# Patient Record
Sex: Female | Born: 1965 | Race: Black or African American | Hispanic: No | Smoking: Never smoker
Health system: Southern US, Community
[De-identification: ages and names within clinical notes are randomized; demographics above are authoritative.]

## PROBLEM LIST (undated history)

## (undated) DIAGNOSIS — E785 Hyperlipidemia, unspecified: Secondary | ICD-10-CM

## (undated) HISTORY — PX: KNEE SURGERY: SHX244

## (undated) HISTORY — PX: FEMUR FRACTURE SURGERY: SHX633

---
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
## 2019-03-01 HISTORY — PX: ABDOMINAL HYSTERECTOMY: SHX81

## 2020-04-01 ENCOUNTER — Other Ambulatory Visit: Payer: Self-pay

## 2020-04-01 ENCOUNTER — Ambulatory Visit: Payer: Medicaid Other | Attending: Internal Medicine

## 2020-04-01 VITALS — BP 108/77 | HR 89

## 2020-04-01 DIAGNOSIS — R2681 Unsteadiness on feet: Secondary | ICD-10-CM | POA: Diagnosis present

## 2020-04-01 DIAGNOSIS — M79604 Pain in right leg: Secondary | ICD-10-CM | POA: Diagnosis present

## 2020-04-01 DIAGNOSIS — M6281 Muscle weakness (generalized): Secondary | ICD-10-CM | POA: Diagnosis present

## 2020-04-01 DIAGNOSIS — R2689 Other abnormalities of gait and mobility: Secondary | ICD-10-CM | POA: Diagnosis present

## 2020-04-01 DIAGNOSIS — M79605 Pain in left leg: Secondary | ICD-10-CM | POA: Insufficient documentation
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
## 2020-04-02 NOTE — Addendum Note (Signed)
Addended by: Baird Kay on: 04/02/2020 04:35 PM   Modules accepted: Orders

## 2020-04-07 ENCOUNTER — Ambulatory Visit: Payer: Medicaid Other | Admitting: Physical Therapy

## 2020-04-07 ENCOUNTER — Other Ambulatory Visit: Payer: Self-pay

## 2020-04-07 DIAGNOSIS — M79604 Pain in right leg: Secondary | ICD-10-CM

## 2020-04-07 DIAGNOSIS — M6281 Muscle weakness (generalized): Secondary | ICD-10-CM

## 2020-04-07 DIAGNOSIS — R2689 Other abnormalities of gait and mobility: Secondary | ICD-10-CM

## 2020-04-07 DIAGNOSIS — M79605 Pain in left leg: Secondary | ICD-10-CM

## 2020-04-07 DIAGNOSIS — R2681 Unsteadiness on feet: Secondary | ICD-10-CM
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
## 2020-04-09 ENCOUNTER — Other Ambulatory Visit: Payer: Self-pay

## 2020-04-09 ENCOUNTER — Ambulatory Visit: Payer: Medicaid Other | Admitting: Physical Therapy

## 2020-04-09 DIAGNOSIS — M79604 Pain in right leg: Secondary | ICD-10-CM

## 2020-04-09 DIAGNOSIS — M6281 Muscle weakness (generalized): Secondary | ICD-10-CM

## 2020-04-09 DIAGNOSIS — R2681 Unsteadiness on feet: Secondary | ICD-10-CM

## 2020-04-09 DIAGNOSIS — M79605 Pain in left leg: Secondary | ICD-10-CM

## 2020-04-09 DIAGNOSIS — R2689 Other abnormalities of gait and mobility: Secondary | ICD-10-CM
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
## 2020-04-14 ENCOUNTER — Other Ambulatory Visit: Payer: Self-pay

## 2020-04-14 ENCOUNTER — Ambulatory Visit: Payer: Medicaid Other

## 2020-04-14 DIAGNOSIS — M79605 Pain in left leg: Secondary | ICD-10-CM

## 2020-04-14 DIAGNOSIS — R2689 Other abnormalities of gait and mobility: Secondary | ICD-10-CM

## 2020-04-14 DIAGNOSIS — M6281 Muscle weakness (generalized): Secondary | ICD-10-CM | POA: Diagnosis not present

## 2020-04-14 DIAGNOSIS — M79604 Pain in right leg: Secondary | ICD-10-CM

## 2020-04-14 DIAGNOSIS — R2681 Unsteadiness on feet: Secondary | ICD-10-CM
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
## 2020-04-16 ENCOUNTER — Ambulatory Visit: Payer: Medicaid Other

## 2020-04-16 ENCOUNTER — Other Ambulatory Visit: Payer: Self-pay

## 2020-04-16 DIAGNOSIS — M6281 Muscle weakness (generalized): Secondary | ICD-10-CM | POA: Diagnosis not present

## 2020-04-16 DIAGNOSIS — R2681 Unsteadiness on feet: Secondary | ICD-10-CM

## 2020-04-16 DIAGNOSIS — R2689 Other abnormalities of gait and mobility: Secondary | ICD-10-CM

## 2020-04-16 DIAGNOSIS — M79605 Pain in left leg: Secondary | ICD-10-CM

## 2020-04-16 DIAGNOSIS — M79604 Pain in right leg: Secondary | ICD-10-CM
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
## 2020-04-20 ENCOUNTER — Ambulatory Visit: Payer: Medicaid Other

## 2020-04-20 ENCOUNTER — Other Ambulatory Visit: Payer: Self-pay

## 2020-04-20 DIAGNOSIS — M79605 Pain in left leg: Secondary | ICD-10-CM

## 2020-04-20 DIAGNOSIS — R2689 Other abnormalities of gait and mobility: Secondary | ICD-10-CM

## 2020-04-20 DIAGNOSIS — M6281 Muscle weakness (generalized): Secondary | ICD-10-CM | POA: Diagnosis not present

## 2020-04-20 DIAGNOSIS — M79604 Pain in right leg: Secondary | ICD-10-CM

## 2020-04-20 DIAGNOSIS — R2681 Unsteadiness on feet: Secondary | ICD-10-CM
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
## 2020-04-22 ENCOUNTER — Other Ambulatory Visit: Payer: Self-pay

## 2020-04-22 ENCOUNTER — Ambulatory Visit: Payer: Medicaid Other

## 2020-04-22 DIAGNOSIS — M6281 Muscle weakness (generalized): Secondary | ICD-10-CM

## 2020-04-22 DIAGNOSIS — R2689 Other abnormalities of gait and mobility: Secondary | ICD-10-CM

## 2020-04-22 DIAGNOSIS — M79605 Pain in left leg: Secondary | ICD-10-CM
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
## 2020-04-28 ENCOUNTER — Ambulatory Visit: Payer: Medicaid Other | Attending: Internal Medicine

## 2020-04-28 ENCOUNTER — Other Ambulatory Visit: Payer: Self-pay

## 2020-04-28 VITALS — BP 99/69 | HR 79

## 2020-04-28 DIAGNOSIS — M79605 Pain in left leg: Secondary | ICD-10-CM | POA: Diagnosis present

## 2020-04-28 DIAGNOSIS — R2681 Unsteadiness on feet: Secondary | ICD-10-CM | POA: Insufficient documentation

## 2020-04-28 DIAGNOSIS — M6281 Muscle weakness (generalized): Secondary | ICD-10-CM | POA: Insufficient documentation

## 2020-04-28 DIAGNOSIS — M79604 Pain in right leg: Secondary | ICD-10-CM | POA: Insufficient documentation

## 2020-04-28 DIAGNOSIS — R2689 Other abnormalities of gait and mobility: Secondary | ICD-10-CM | POA: Diagnosis present
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
## 2020-04-30 ENCOUNTER — Other Ambulatory Visit: Payer: Self-pay

## 2020-04-30 ENCOUNTER — Ambulatory Visit: Payer: Medicaid Other

## 2020-04-30 DIAGNOSIS — R2689 Other abnormalities of gait and mobility: Secondary | ICD-10-CM

## 2020-04-30 DIAGNOSIS — M6281 Muscle weakness (generalized): Secondary | ICD-10-CM | POA: Diagnosis not present

## 2020-04-30 DIAGNOSIS — M79605 Pain in left leg: Secondary | ICD-10-CM
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
## 2020-05-05 ENCOUNTER — Ambulatory Visit: Payer: Medicaid Other

## 2020-05-05 ENCOUNTER — Other Ambulatory Visit: Payer: Self-pay

## 2020-05-05 DIAGNOSIS — R2689 Other abnormalities of gait and mobility: Secondary | ICD-10-CM

## 2020-05-05 DIAGNOSIS — M6281 Muscle weakness (generalized): Secondary | ICD-10-CM | POA: Diagnosis not present

## 2020-05-05 DIAGNOSIS — M79605 Pain in left leg: Secondary | ICD-10-CM
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
## 2020-05-07 ENCOUNTER — Ambulatory Visit: Payer: Medicaid Other

## 2020-05-07 ENCOUNTER — Other Ambulatory Visit: Payer: Self-pay

## 2020-05-07 DIAGNOSIS — R2681 Unsteadiness on feet: Secondary | ICD-10-CM

## 2020-05-07 DIAGNOSIS — M79605 Pain in left leg: Secondary | ICD-10-CM

## 2020-05-07 DIAGNOSIS — M6281 Muscle weakness (generalized): Secondary | ICD-10-CM

## 2020-05-07 DIAGNOSIS — M79604 Pain in right leg: Secondary | ICD-10-CM

## 2020-05-07 DIAGNOSIS — R2689 Other abnormalities of gait and mobility: Secondary | ICD-10-CM
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
## 2020-05-12 ENCOUNTER — Ambulatory Visit: Payer: Medicaid Other

## 2020-05-12 ENCOUNTER — Other Ambulatory Visit: Payer: Self-pay

## 2020-05-12 DIAGNOSIS — M79605 Pain in left leg: Secondary | ICD-10-CM

## 2020-05-12 DIAGNOSIS — M79604 Pain in right leg: Secondary | ICD-10-CM

## 2020-05-12 DIAGNOSIS — M6281 Muscle weakness (generalized): Secondary | ICD-10-CM | POA: Diagnosis not present

## 2020-05-12 DIAGNOSIS — R2689 Other abnormalities of gait and mobility: Secondary | ICD-10-CM
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
## 2020-05-14 ENCOUNTER — Other Ambulatory Visit: Payer: Self-pay

## 2020-05-14 ENCOUNTER — Ambulatory Visit: Payer: Medicaid Other

## 2020-05-14 DIAGNOSIS — M79605 Pain in left leg: Secondary | ICD-10-CM

## 2020-05-14 DIAGNOSIS — M6281 Muscle weakness (generalized): Secondary | ICD-10-CM | POA: Diagnosis not present

## 2020-05-14 DIAGNOSIS — R2689 Other abnormalities of gait and mobility: Secondary | ICD-10-CM

## 2020-05-14 DIAGNOSIS — M79604 Pain in right leg: Secondary | ICD-10-CM
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
## 2020-05-19 ENCOUNTER — Ambulatory Visit: Payer: Medicaid Other

## 2020-05-21 ENCOUNTER — Ambulatory Visit: Payer: Medicaid Other

## 2020-05-26 ENCOUNTER — Other Ambulatory Visit: Payer: Self-pay

## 2020-05-26 ENCOUNTER — Ambulatory Visit: Payer: Medicaid Other

## 2020-05-26 VITALS — BP 113/72 | HR 68

## 2020-05-26 DIAGNOSIS — M6281 Muscle weakness (generalized): Secondary | ICD-10-CM

## 2020-05-26 DIAGNOSIS — M79605 Pain in left leg: Secondary | ICD-10-CM
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
## 2020-05-28 ENCOUNTER — Ambulatory Visit: Payer: Medicaid Other

## 2020-06-01 ENCOUNTER — Ambulatory Visit: Payer: Medicaid Other

## 2020-06-03 ENCOUNTER — Ambulatory Visit: Payer: Medicaid Other

## 2020-06-08 ENCOUNTER — Ambulatory Visit: Payer: Medicaid Other

## 2020-06-10 ENCOUNTER — Ambulatory Visit: Payer: Medicaid Other

## 2020-06-17 ENCOUNTER — Ambulatory Visit: Payer: Medicaid Other | Attending: Internal Medicine

## 2020-06-17 ENCOUNTER — Other Ambulatory Visit (HOSPITAL_COMMUNITY): Payer: Self-pay | Admitting: Family Medicine

## 2020-06-17 ENCOUNTER — Other Ambulatory Visit: Payer: Self-pay

## 2020-06-17 ENCOUNTER — Other Ambulatory Visit: Payer: Self-pay | Admitting: Family Medicine

## 2020-06-17 DIAGNOSIS — R2689 Other abnormalities of gait and mobility: Secondary | ICD-10-CM | POA: Diagnosis not present

## 2020-06-17 DIAGNOSIS — M79604 Pain in right leg: Secondary | ICD-10-CM | POA: Diagnosis present

## 2020-06-17 DIAGNOSIS — M79652 Pain in left thigh: Secondary | ICD-10-CM

## 2020-06-17 DIAGNOSIS — R2681 Unsteadiness on feet: Secondary | ICD-10-CM

## 2020-06-17 DIAGNOSIS — R1032 Left lower quadrant pain: Secondary | ICD-10-CM

## 2020-06-17 DIAGNOSIS — M79605 Pain in left leg: Secondary | ICD-10-CM | POA: Diagnosis present

## 2020-06-17 DIAGNOSIS — M6281 Muscle weakness (generalized): Secondary | ICD-10-CM
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
## 2020-06-22 ENCOUNTER — Ambulatory Visit: Payer: Medicaid Other

## 2020-06-24 ENCOUNTER — Other Ambulatory Visit: Payer: Self-pay

## 2020-06-24 ENCOUNTER — Ambulatory Visit: Payer: Medicaid Other

## 2020-06-24 DIAGNOSIS — M79605 Pain in left leg: Secondary | ICD-10-CM

## 2020-06-24 DIAGNOSIS — R2681 Unsteadiness on feet: Secondary | ICD-10-CM

## 2020-06-24 DIAGNOSIS — R2689 Other abnormalities of gait and mobility: Secondary | ICD-10-CM

## 2020-06-24 DIAGNOSIS — M6281 Muscle weakness (generalized): Secondary | ICD-10-CM

## 2020-06-24 DIAGNOSIS — M79604 Pain in right leg: Secondary | ICD-10-CM
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
## 2020-06-27 ENCOUNTER — Ambulatory Visit: Payer: Medicaid Other

## 2020-07-01 ENCOUNTER — Ambulatory Visit: Payer: Medicaid Other

## 2020-07-03 ENCOUNTER — Encounter: Payer: Self-pay | Admitting: Emergency Medicine

## 2020-07-03 ENCOUNTER — Emergency Department
Admission: EM | Admit: 2020-07-03 | Discharge: 2020-07-03 | Disposition: A | Discharge status: Home / Self Care | Payer: Medicaid Other | Attending: Emergency Medicine | Admitting: Emergency Medicine

## 2020-07-03 ENCOUNTER — Emergency Department: Payer: Medicaid Other

## 2020-07-03 DIAGNOSIS — M79662 Pain in left lower leg: Secondary | ICD-10-CM | POA: Diagnosis present

## 2020-07-03 DIAGNOSIS — M79605 Pain in left leg: Secondary | ICD-10-CM

## 2020-07-03 DIAGNOSIS — M7989 Other specified soft tissue disorders: Secondary | ICD-10-CM

## 2020-07-03 LAB — URINALYSIS, COMPLETE (UACMP) WITH MICROSCOPIC
Bilirubin Urine: NEGATIVE
Glucose, UA: NEGATIVE mg/dL
Hgb urine dipstick: NEGATIVE
Ketones, ur: NEGATIVE mg/dL
Leukocytes,Ua: NEGATIVE
Nitrite: NEGATIVE
Protein, ur: NEGATIVE mg/dL
Specific Gravity, Urine: 1.019 (ref 1.005–1.030)
pH: 7 (ref 5.0–8.0)

## 2020-07-03 LAB — CBC WITH DIFFERENTIAL/PLATELET
Abs Immature Granulocytes: 0.02 10*3/uL (ref 0.00–0.07)
Basophils Absolute: 0 10*3/uL (ref 0.0–0.1)
Basophils Relative: 1 %
Eosinophils Absolute: 0.1 10*3/uL (ref 0.0–0.5)
Eosinophils Relative: 2 %
HCT: 38.1 % (ref 36.0–46.0)
Hemoglobin: 12.2 g/dL (ref 12.0–15.0)
Immature Granulocytes: 0 %
Lymphocytes Relative: 33 %
Lymphs Abs: 1.9 10*3/uL (ref 0.7–4.0)
MCH: 27.1 pg (ref 26.0–34.0)
MCHC: 32 g/dL (ref 30.0–36.0)
MCV: 84.7 fL (ref 80.0–100.0)
Monocytes Absolute: 0.5 10*3/uL (ref 0.1–1.0)
Monocytes Relative: 8 %
Neutro Abs: 3.3 10*3/uL (ref 1.7–7.7)
Neutrophils Relative %: 56 %
Platelets: 210 10*3/uL (ref 150–400)
RBC: 4.5 MIL/uL (ref 3.87–5.11)
RDW: 14.6 % (ref 11.5–15.5)
WBC: 5.9 10*3/uL (ref 4.0–10.5)
nRBC: 0 % (ref 0.0–0.2)

## 2020-07-03 LAB — COMPREHENSIVE METABOLIC PANEL
ALT: 12 U/L (ref 0–44)
AST: 14 U/L — ABNORMAL LOW (ref 15–41)
Albumin: 4 g/dL (ref 3.5–5.0)
Alkaline Phosphatase: 57 U/L (ref 38–126)
Anion gap: 9 (ref 5–15)
BUN: 19 mg/dL (ref 6–20)
CO2: 23 mmol/L (ref 22–32)
Calcium: 9.2 mg/dL (ref 8.9–10.3)
Chloride: 106 mmol/L (ref 98–111)
Creatinine, Ser: 0.82 mg/dL (ref 0.44–1.00)
GFR, Estimated: >60 mL/min (ref >60)
Glucose, Bld: 112 mg/dL — ABNORMAL HIGH (ref 70–99)
Potassium: 3.7 mmol/L (ref 3.5–5.1)
Sodium: 138 mmol/L (ref 135–145)
Total Bilirubin: 0.6 mg/dL (ref 0.3–1.2)
Total Protein: 7.4 g/dL (ref 6.5–8.1)

## 2020-07-03 NOTE — ED Triage Notes (Signed)
C/O left leg swelling x 2 weeks.  Here to r/o DVT

## 2020-07-03 NOTE — Discharge Instructions (Addendum)
US DVT negative Follow up for your doctor for MRI to further evaluate.  Return to ER for any other concern
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
## 2020-07-07 ENCOUNTER — Ambulatory Visit: Payer: Medicaid Other

## 2020-07-10 ENCOUNTER — Other Ambulatory Visit: Payer: Self-pay

## 2020-07-10 ENCOUNTER — Ambulatory Visit: Payer: Medicaid Other | Attending: Internal Medicine

## 2020-07-10 DIAGNOSIS — M6281 Muscle weakness (generalized): Secondary | ICD-10-CM | POA: Diagnosis present

## 2020-07-10 DIAGNOSIS — R2689 Other abnormalities of gait and mobility: Secondary | ICD-10-CM | POA: Diagnosis present

## 2020-07-10 DIAGNOSIS — M79605 Pain in left leg: Secondary | ICD-10-CM | POA: Insufficient documentation
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
## 2020-07-15 ENCOUNTER — Ambulatory Visit: Payer: Medicaid Other

## 2020-07-20 ENCOUNTER — Ambulatory Visit: Payer: Medicaid Other

## 2020-07-24 ENCOUNTER — Ambulatory Visit: Payer: Medicaid Other

## 2020-07-29 ENCOUNTER — Ambulatory Visit: Payer: Medicaid Other

## 2020-07-31 ENCOUNTER — Other Ambulatory Visit: Payer: Self-pay

## 2020-07-31 ENCOUNTER — Ambulatory Visit
Admission: RE | Admit: 2020-07-31 | Discharge: 2020-07-31 | Disposition: A | Discharge status: Home / Self Care | Payer: Medicaid Other | Source: Ambulatory Visit | Attending: Family Medicine | Admitting: Family Medicine

## 2020-07-31 DIAGNOSIS — R1032 Left lower quadrant pain: Secondary | ICD-10-CM | POA: Diagnosis present

## 2020-07-31 DIAGNOSIS — M79652 Pain in left thigh: Secondary | ICD-10-CM | POA: Diagnosis not present

## 2020-08-05 ENCOUNTER — Other Ambulatory Visit: Payer: Self-pay

## 2020-08-05 ENCOUNTER — Ambulatory Visit: Payer: Medicaid Other | Attending: Internal Medicine

## 2020-08-05 DIAGNOSIS — M6281 Muscle weakness (generalized): Secondary | ICD-10-CM | POA: Diagnosis present

## 2020-08-05 DIAGNOSIS — M79604 Pain in right leg: Secondary | ICD-10-CM | POA: Insufficient documentation

## 2020-08-05 DIAGNOSIS — R2681 Unsteadiness on feet: Secondary | ICD-10-CM | POA: Diagnosis present

## 2020-08-05 DIAGNOSIS — R2689 Other abnormalities of gait and mobility: Secondary | ICD-10-CM | POA: Insufficient documentation

## 2020-08-05 DIAGNOSIS — M79605 Pain in left leg: Secondary | ICD-10-CM | POA: Diagnosis present
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
## 2020-08-12 ENCOUNTER — Other Ambulatory Visit: Payer: Self-pay

## 2020-08-12 ENCOUNTER — Ambulatory Visit: Payer: Medicaid Other

## 2020-08-12 DIAGNOSIS — M79605 Pain in left leg: Secondary | ICD-10-CM | POA: Diagnosis not present

## 2020-08-12 DIAGNOSIS — R2689 Other abnormalities of gait and mobility: Secondary | ICD-10-CM

## 2020-08-12 DIAGNOSIS — M6281 Muscle weakness (generalized): Secondary | ICD-10-CM
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
## 2020-08-19 ENCOUNTER — Ambulatory Visit: Payer: Medicaid Other

## 2020-08-26 ENCOUNTER — Ambulatory Visit: Payer: Medicaid Other

## 2020-09-02 ENCOUNTER — Other Ambulatory Visit: Payer: Self-pay

## 2020-09-02 ENCOUNTER — Emergency Department: Payer: Medicaid Other

## 2020-09-02 ENCOUNTER — Encounter: Payer: Self-pay | Admitting: Emergency Medicine

## 2020-09-02 ENCOUNTER — Emergency Department
Admission: EM | Admit: 2020-09-02 | Discharge: 2020-09-02 | Disposition: A | Discharge status: Home / Self Care | Payer: Medicaid Other | Attending: Emergency Medicine | Admitting: Emergency Medicine

## 2020-09-02 DIAGNOSIS — W228XXA Striking against or struck by other objects, initial encounter: Secondary | ICD-10-CM | POA: Diagnosis not present

## 2020-09-02 DIAGNOSIS — M25552 Pain in left hip: Secondary | ICD-10-CM | POA: Diagnosis present

## 2020-09-02 DIAGNOSIS — M79641 Pain in right hand: Secondary | ICD-10-CM | POA: Insufficient documentation

## 2020-09-02 DIAGNOSIS — M25559 Pain in unspecified hip: Secondary | ICD-10-CM

## 2020-09-02 NOTE — Discharge Instructions (Signed)
Your exam and x-rays are normal at this time.  No signs of any serious injury or dislocation of the hip.  Continue with your home muscle relaxant, follow-up with primary orthopedic provider for ongoing symptoms.  Return to the ED if necessary.
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
## 2021-01-14 ENCOUNTER — Ambulatory Visit: Payer: Self-pay | Admitting: Student

## 2021-01-14 DIAGNOSIS — S72352K Displaced comminuted fracture of shaft of left femur, subsequent encounter for closed fracture with nonunion: Secondary | ICD-10-CM | POA: Insufficient documentation

## 2021-01-29 NOTE — Final Progress Note (Signed)
Surgical Instructions    Your procedure is scheduled on 02/03/21.  Report to [facility] Main Entrance "A" at 6:30 A.M., then check in with the Admitting office.  Call this number if you have problems the morning of surgery:  [phone]   If you have any questions prior to your surgery date call [phone]: Open Monday-Friday 8am-4pm    Remember:  Do not eat after midnight the night before your surgery  You may drink clear liquids until 5:30 the morning of your surgery.   Clear liquids allowed are: Water, Non-Citrus Juices (without pulp), Carbonated Beverages, Clear Tea, Black Coffee ONLY (NO MILK, CREAM OR POWDERED CREAMER of any kind), and Gatorade  Please complete your PRE-SURGERY ENSURE that was provided to you by 5:30 the morning of surgery.  Please, if able, drink it in one setting. DO NOT SIP.     Take these medicines the morning of surgery with A SIP OF WATER: fluticasone (FLONASE)   As of today, STOP taking any Aspirin (unless otherwise instructed by your surgeon) Aleve, Naproxen, Ibuprofen, Motrin, Advil, Goody's, BC's, all herbal medications, fish oil, and all vitamins.     After your COVID test   You are not required to quarantine however you are required to wear a well-fitting mask when you are out and around people not in your household.  If your mask becomes wet or soiled, replace with a new one.  Wash your hands often with soap and water for 20 seconds or clean your hands with an alcohol-based hand sanitizer that contains at least 60% alcohol.  Do not share personal items.  Notify your provider: if you are in close contact with someone who has COVID  or if you develop a fever of 100.4 or greater, sneezing, cough, sore throat, shortness of breath or body aches.           Do not wear jewelry or makeup Do not wear lotions, powders, perfumes  or deodorant. Do not shave 48 hours prior to surgery.   Do not bring valuables to the hospital. DO Not wear nail  polish, gel polish, artificial nails, or any other type of covering on natural nails including finger and toenails. If patients have artificial nails, gel coating, etc. that need to be removed by a nail salon, please have this removed prior to surgery or surgery may need to be canceled/delayed if the surgeon/ anesthesia feels like the patient is unable to be adequately monitored.             [facility] is not responsible for any belongings or valuables.  Do NOT Smoke (Tobacco/Vaping)  24 hours prior to your procedure  If you use a CPAP at night, you may bring your mask for your overnight stay.   Contacts, glasses, hearing aids, dentures or partials may not be worn into surgery, please bring cases for these belongings   For patients admitted to the hospital, discharge time will be determined by your treatment team.   Patients discharged the day of surgery will not be allowed to drive home, and someone needs to stay with them for 24 hours.  NO VISITORS WILL BE ALLOWED IN PRE-OP WHERE PATIENTS ARE PREPPED FOR SURGERY.  ONLY 1 SUPPORT PERSON MAY BE PRESENT IN THE WAITING ROOM WHILE YOU ARE IN SURGERY.  IF YOU ARE TO BE ADMITTED, ONCE YOU ARE IN YOUR ROOM YOU WILL BE ALLOWED TWO (2) VISITORS. 1 (ONE) VISITOR MAY STAY OVERNIGHT BUT MUST ARRIVE TO THE ROOM BY  8pm.  Minor children may have two parents present. Special consideration for safety and communication needs will be reviewed on a case by case basis.  Special instructions:    Oral Hygiene is also important to reduce your risk of infection.  Remember - BRUSH YOUR TEETH THE MORNING OF SURGERY WITH YOUR REGULAR TOOTHPASTE   [place]- Preparing For Surgery  Before surgery, you can play an important role. Because skin is not sterile, your skin needs to be as free of germs as possible. You can reduce the number of germs on your skin by washing with CHG (chlorahexidine gluconate) Soap before surgery.  CHG is an antiseptic cleaner which kills  germs and bonds with the skin to continue killing germs even after washing.     Please do not use if you have an allergy to CHG or antibacterial soaps. If your skin becomes reddened/irritated stop using the CHG.  Do not shave (including legs and underarms) for at least 48 hours prior to first CHG shower. It is OK to shave your face.  Please follow these instructions carefully.     Shower the NIGHT BEFORE SURGERY and the MORNING OF SURGERY with CHG Soap.   If you chose to wash your hair, wash your hair first as usual with your normal shampoo. After you shampoo, rinse your hair and body thoroughly to remove the shampoo.  Then [employer] and genitals (private parts) with your normal soap and rinse thoroughly to remove soap.  After that Use CHG Soap as you would any other liquid soap. You can apply CHG directly to the skin and wash gently with a scrungie or a clean washcloth.   Apply the CHG Soap to your body ONLY FROM THE NECK DOWN.  Do not use on open wounds or open sores. Avoid contact with your eyes, ears, mouth and genitals (private parts). Wash Face and genitals (private parts)  with your normal soap.   Wash thoroughly, paying special attention to the area where your surgery will be performed.  Thoroughly rinse your body with warm water from the neck down.  DO NOT shower/wash with your normal soap after using and rinsing off the CHG Soap.  Pat yourself dry with a CLEAN TOWEL.  Wear CLEAN PAJAMAS to bed the night before surgery  Place CLEAN SHEETS on your bed the night before your surgery  DO NOT SLEEP WITH PETS.   Day of Surgery:  Take a shower with CHG soap. Wear Clean/Comfortable clothing the morning of surgery Do not apply any deodorants/lotions.   Remember to brush your teeth WITH YOUR REGULAR TOOTHPASTE.   Please read over the following fact sheets that you were given.

## 2021-02-01 ENCOUNTER — Encounter (HOSPITAL_COMMUNITY): Payer: Self-pay

## 2021-02-01 ENCOUNTER — Encounter (HOSPITAL_COMMUNITY)
Admission: RE | Admit: 2021-02-01 | Discharge: 2021-02-01 | Disposition: A | Discharge status: Home / Self Care | Payer: Medicaid Other | Source: Ambulatory Visit | Attending: Student | Admitting: Student

## 2021-02-01 ENCOUNTER — Other Ambulatory Visit: Payer: Self-pay

## 2021-02-01 VITALS — BP 128/87 | HR 80 | Temp 98.5°F | Resp 17 | Ht 68.0 in | Wt 240.5 lb

## 2021-02-01 DIAGNOSIS — S72352K Displaced comminuted fracture of shaft of left femur, subsequent encounter for closed fracture with nonunion: Secondary | ICD-10-CM | POA: Insufficient documentation

## 2021-02-01 DIAGNOSIS — Z20822 Contact with and (suspected) exposure to covid-19: Secondary | ICD-10-CM | POA: Insufficient documentation

## 2021-02-01 DIAGNOSIS — Z01812 Encounter for preprocedural laboratory examination: Secondary | ICD-10-CM | POA: Insufficient documentation

## 2021-02-01 DIAGNOSIS — X58XXXD Exposure to other specified factors, subsequent encounter: Secondary | ICD-10-CM | POA: Diagnosis not present

## 2021-02-01 DIAGNOSIS — Z01818 Encounter for other preprocedural examination: Secondary | ICD-10-CM

## 2021-02-01 HISTORY — DX: Hyperlipidemia, unspecified: E78.5

## 2021-02-01 LAB — CBC WITH DIFFERENTIAL/PLATELET
Abs Immature Granulocytes: 0.01 10*3/uL (ref 0.00–0.07)
Basophils Absolute: 0 10*3/uL (ref 0.0–0.1)
Basophils Relative: 1 %
Eosinophils Absolute: 0.1 10*3/uL (ref 0.0–0.5)
Eosinophils Relative: 2 %
HCT: 42.9 % (ref 36.0–46.0)
Hemoglobin: 13.5 g/dL (ref 12.0–15.0)
Immature Granulocytes: 0 %
Lymphocytes Relative: 41 %
Lymphs Abs: 2.2 10*3/uL (ref 0.7–4.0)
MCH: 27.6 pg (ref 26.0–34.0)
MCHC: 31.5 g/dL (ref 30.0–36.0)
MCV: 87.6 fL (ref 80.0–100.0)
Monocytes Absolute: 0.5 10*3/uL (ref 0.1–1.0)
Monocytes Relative: 9 %
Neutro Abs: 2.6 10*3/uL (ref 1.7–7.7)
Neutrophils Relative %: 47 %
Platelets: 261 10*3/uL (ref 150–400)
RBC: 4.9 MIL/uL (ref 3.87–5.11)
RDW: 14.5 % (ref 11.5–15.5)
WBC: 5.5 10*3/uL (ref 4.0–10.5)
nRBC: 0 % (ref 0.0–0.2)

## 2021-02-01 LAB — HEMOGLOBIN A1C
Hgb A1c MFr Bld: 6 % — ABNORMAL HIGH (ref 4.8–5.6)
Mean Plasma Glucose: 125.5 mg/dL

## 2021-02-01 LAB — BASIC METABOLIC PANEL
Anion gap: 7 (ref 5–15)
BUN: 18 mg/dL (ref 6–20)
CO2: 26 mmol/L (ref 22–32)
Calcium: 9.4 mg/dL (ref 8.9–10.3)
Chloride: 102 mmol/L (ref 98–111)
Creatinine, Ser: 0.95 mg/dL (ref 0.44–1.00)
GFR, Estimated: >60 mL/min (ref >60)
Glucose, Bld: 83 mg/dL (ref 70–99)
Potassium: 4 mmol/L (ref 3.5–5.1)
Sodium: 135 mmol/L (ref 135–145)

## 2021-02-01 LAB — TSH: TSH: 1.158 u[IU]/mL (ref 0.350–4.500)

## 2021-02-01 LAB — SEDIMENTATION RATE: Sed Rate: 24 mm/hr — ABNORMAL HIGH (ref 0–22)

## 2021-02-01 LAB — PROTIME-INR
INR: 1 (ref 0.8–1.2)
Prothrombin Time: 13.3 seconds (ref 11.4–15.2)

## 2021-02-01 LAB — C-REACTIVE PROTEIN: CRP: 0.8 mg/dL (ref <1.0)

## 2021-02-01 LAB — VITAMIN D 25 HYDROXY (VIT D DEFICIENCY, FRACTURES): Vit D, 25-Hydroxy: 22.07 ng/mL — ABNORMAL LOW (ref 30–100)

## 2021-02-01 LAB — SARS CORONAVIRUS 2 (TAT 6-24 HRS): SARS Coronavirus 2: NEGATIVE

## 2021-02-01 NOTE — Progress Notes (Signed)
PCP - [employer] [place]  Chest x-ray - 11/26/19 CE EKG - 11/30/19  DM - Diabetes  ERAS Protcol - Yes  PRE-SURGERY Ensure Drink given   COVID TEST- 02/01/21   Anesthesia review: No  Patient denies shortness of breath, fever, cough and chest pain at PAT appointment   All instructions explained to the patient, with a verbal understanding of the material. Patient agrees to go over the instructions while at home for a better understanding. Patient also instructed to wear a mask while in public after being tested for COVID-19. The opportunity to ask questions was provided.
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
## 2021-02-03 ENCOUNTER — Inpatient Hospital Stay (HOSPITAL_COMMUNITY)
Admission: RE | Admit: 2021-02-03 | Discharge: 2021-02-04 | DRG: 481 | Disposition: A | Discharge status: Home / Self Care | Payer: Medicaid Other | Source: Ambulatory Visit | Attending: Student | Admitting: Student

## 2021-02-03 ENCOUNTER — Inpatient Hospital Stay (HOSPITAL_COMMUNITY): Payer: Medicaid Other

## 2021-02-03 ENCOUNTER — Ambulatory Visit (HOSPITAL_COMMUNITY): Payer: Medicaid Other | Admitting: Certified Registered"

## 2021-02-03 ENCOUNTER — Encounter (HOSPITAL_COMMUNITY)
Admission: RE | Disposition: A | Discharge status: Home / Self Care | Payer: Self-pay | Source: Ambulatory Visit | Attending: Student

## 2021-02-03 ENCOUNTER — Ambulatory Visit (HOSPITAL_COMMUNITY): Payer: Medicaid Other

## 2021-02-03 ENCOUNTER — Encounter (HOSPITAL_COMMUNITY): Payer: Self-pay | Admitting: Student

## 2021-02-03 DIAGNOSIS — Z419 Encounter for procedure for purposes other than remedying health state, unspecified: Secondary | ICD-10-CM

## 2021-02-03 DIAGNOSIS — T84125A Displacement of internal fixation device of left femur, initial encounter: Secondary | ICD-10-CM | POA: Diagnosis present

## 2021-02-03 DIAGNOSIS — Z90710 Acquired absence of both cervix and uterus: Secondary | ICD-10-CM | POA: Diagnosis not present

## 2021-02-03 DIAGNOSIS — Y792 Prosthetic and other implants, materials and accessory orthopedic devices associated with adverse incidents: Secondary | ICD-10-CM | POA: Diagnosis present

## 2021-02-03 DIAGNOSIS — E785 Hyperlipidemia, unspecified: Secondary | ICD-10-CM | POA: Diagnosis present

## 2021-02-03 DIAGNOSIS — S72352K Displaced comminuted fracture of shaft of left femur, subsequent encounter for closed fracture with nonunion: Secondary | ICD-10-CM | POA: Diagnosis present

## 2021-02-03 DIAGNOSIS — T148XXA Other injury of unspecified body region, initial encounter: Secondary | ICD-10-CM

## 2021-02-03 DIAGNOSIS — S72302K Unspecified fracture of shaft of left femur, subsequent encounter for closed fracture with nonunion: Secondary | ICD-10-CM | POA: Diagnosis present

## 2021-02-03 HISTORY — PX: HARDWARE REMOVAL: SHX979

## 2021-02-03 HISTORY — PX: FEMUR IM NAIL: SHX1597

## 2021-02-03 LAB — CBC
HCT: 39.8 % (ref 36.0–46.0)
Hemoglobin: 12.4 g/dL (ref 12.0–15.0)
MCH: 26.7 pg (ref 26.0–34.0)
MCHC: 31.2 g/dL (ref 30.0–36.0)
MCV: 85.6 fL (ref 80.0–100.0)
Platelets: 248 10*3/uL (ref 150–400)
RBC: 4.65 MIL/uL (ref 3.87–5.11)
RDW: 14.4 % (ref 11.5–15.5)
WBC: 9.5 10*3/uL (ref 4.0–10.5)
nRBC: 0 % (ref 0.0–0.2)

## 2021-02-03 LAB — CREATININE, SERUM
Creatinine, Ser: 0.96 mg/dL (ref 0.44–1.00)
GFR, Estimated: >60 mL/min (ref >60)

## 2021-02-03 SURGERY — INSERTION, INTRAMEDULLARY ROD, FEMUR, RETROGRADE
Anesthesia: General | Laterality: Left

## 2021-02-03 MED ORDER — LIDOCAINE 2% (20 MG/ML) 5 ML SYRINGE
INTRAMUSCULAR | Status: DC | PRN
  Administered 2021-02-03: 60 mg via INTRAVENOUS

## 2021-02-03 MED ORDER — CEFAZOLIN SODIUM-DEXTROSE 2-4 GM/100ML-% IV SOLN
INTRAVENOUS | Status: AC
  Filled 2021-02-03: qty 100

## 2021-02-03 MED ORDER — MIDAZOLAM HCL 2 MG/2ML IJ SOLN
INTRAMUSCULAR | Status: DC | PRN
  Administered 2021-02-03: 2 mg via INTRAVENOUS

## 2021-02-03 MED ORDER — POLYETHYLENE GLYCOL 3350 17 G PO PACK: 17.0000 g | PACK | Freq: Every day | ORAL | Status: DC | PRN

## 2021-02-03 MED ORDER — ROCURONIUM BROMIDE 10 MG/ML (PF) SYRINGE
PREFILLED_SYRINGE | INTRAVENOUS | Status: DC | PRN
  Administered 2021-02-03: 20 mg via INTRAVENOUS
  Administered 2021-02-03: 60 mg via INTRAVENOUS

## 2021-02-03 MED ORDER — LACTATED RINGERS IV SOLN: INTRAVENOUS | Status: DC

## 2021-02-03 MED ORDER — ONDANSETRON HCL 4 MG/2ML IJ SOLN
INTRAMUSCULAR | Status: AC
  Filled 2021-02-03: qty 2

## 2021-02-03 MED ORDER — FENTANYL CITRATE (PF) 100 MCG/2ML IJ SOLN
INTRAMUSCULAR | Status: AC
  Filled 2021-02-03: qty 2

## 2021-02-03 MED ORDER — SODIUM CHLORIDE 0.9 % IV SOLN: INTRAVENOUS | Status: DC

## 2021-02-03 MED ORDER — OXYCODONE HCL 5 MG PO TABS
5.0000 mg | ORAL_TABLET | Freq: Once | ORAL | Status: AC | PRN
  Administered 2021-02-03: 5 mg via ORAL

## 2021-02-03 MED ORDER — DEXAMETHASONE SODIUM PHOSPHATE 10 MG/ML IJ SOLN
INTRAMUSCULAR | Status: DC | PRN
  Administered 2021-02-03: 5 mg via INTRAVENOUS

## 2021-02-03 MED ORDER — OXYCODONE HCL 5 MG PO TABS: 10.0000 mg | ORAL_TABLET | ORAL | Status: DC | PRN

## 2021-02-03 MED ORDER — ACETAMINOPHEN 500 MG PO TABS
1000.0000 mg | ORAL_TABLET | Freq: Four times a day (QID) | ORAL | Status: DC
  Administered 2021-02-03 – 2021-02-04 (×3): 1000 mg via ORAL
  Filled 2021-02-03 (×3): qty 2

## 2021-02-03 MED ORDER — CHLORHEXIDINE GLUCONATE 0.12 % MT SOLN
15.0000 mL | Freq: Once | OROMUCOSAL | Status: AC
  Administered 2021-02-03: 15 mL via OROMUCOSAL
  Filled 2021-02-03: qty 15

## 2021-02-03 MED ORDER — ONDANSETRON HCL 4 MG/2ML IJ SOLN
INTRAMUSCULAR | Status: DC | PRN
  Administered 2021-02-03: 4 mg via INTRAVENOUS

## 2021-02-03 MED ORDER — PROPOFOL 10 MG/ML IV BOLUS
INTRAVENOUS | Status: AC
  Filled 2021-02-03: qty 20

## 2021-02-03 MED ORDER — OXYCODONE HCL 5 MG/5ML PO SOLN: 5.0000 mg | Freq: Once | ORAL | Status: AC | PRN

## 2021-02-03 MED ORDER — ONDANSETRON HCL 4 MG PO TABS: 4.0000 mg | ORAL_TABLET | Freq: Four times a day (QID) | ORAL | Status: DC | PRN

## 2021-02-03 MED ORDER — VANCOMYCIN HCL 1000 MG IV SOLR
INTRAVENOUS | Status: AC
  Filled 2021-02-03: qty 20

## 2021-02-03 MED ORDER — OXYCODONE HCL 5 MG PO TABS
ORAL_TABLET | ORAL | Status: AC
  Filled 2021-02-03: qty 1

## 2021-02-03 MED ORDER — DOCUSATE SODIUM 100 MG PO CAPS
100.0000 mg | ORAL_CAPSULE | Freq: Two times a day (BID) | ORAL | Status: DC
  Administered 2021-02-03 – 2021-02-04 (×2): 100 mg via ORAL
  Filled 2021-02-03 (×2): qty 1

## 2021-02-03 MED ORDER — HYDROMORPHONE HCL 1 MG/ML IJ SOLN
0.5000 mg | INTRAMUSCULAR | Status: DC | PRN
  Administered 2021-02-03: 1 mg via INTRAVENOUS
  Filled 2021-02-03: qty 1

## 2021-02-03 MED ORDER — ONDANSETRON HCL 4 MG/2ML IJ SOLN: 4.0000 mg | Freq: Four times a day (QID) | INTRAMUSCULAR | Status: DC | PRN

## 2021-02-03 MED ORDER — ENOXAPARIN SODIUM 40 MG/0.4ML IJ SOSY
40.0000 mg | PREFILLED_SYRINGE | INTRAMUSCULAR | Status: DC
  Administered 2021-02-04: 40 mg via SUBCUTANEOUS
  Filled 2021-02-03: qty 0.4

## 2021-02-03 MED ORDER — DEXAMETHASONE SODIUM PHOSPHATE 10 MG/ML IJ SOLN
INTRAMUSCULAR | Status: AC
  Filled 2021-02-03: qty 1

## 2021-02-03 MED ORDER — KETOROLAC TROMETHAMINE 15 MG/ML IJ SOLN
15.0000 mg | Freq: Four times a day (QID) | INTRAMUSCULAR | Status: DC
  Administered 2021-02-03 – 2021-02-04 (×3): 15 mg via INTRAVENOUS
  Filled 2021-02-03 (×3): qty 1

## 2021-02-03 MED ORDER — FENTANYL CITRATE (PF) 100 MCG/2ML IJ SOLN
25.0000 ug | INTRAMUSCULAR | Status: DC | PRN
  Administered 2021-02-03 (×3): 50 ug via INTRAVENOUS

## 2021-02-03 MED ORDER — CEFAZOLIN SODIUM-DEXTROSE 2-4 GM/100ML-% IV SOLN
2.0000 g | Freq: Three times a day (TID) | INTRAVENOUS | Status: AC
  Administered 2021-02-03 – 2021-02-04 (×3): 2 g via INTRAVENOUS
  Filled 2021-02-03 (×3): qty 100

## 2021-02-03 MED ORDER — ROCURONIUM BROMIDE 10 MG/ML (PF) SYRINGE
PREFILLED_SYRINGE | INTRAVENOUS | Status: AC
  Filled 2021-02-03: qty 10

## 2021-02-03 MED ORDER — METHOCARBAMOL 1000 MG/10ML IJ SOLN: 500.0000 mg | Freq: Four times a day (QID) | INTRAVENOUS | Status: DC | PRN

## 2021-02-03 MED ORDER — CEFAZOLIN SODIUM-DEXTROSE 2-4 GM/100ML-% IV SOLN: 2.0000 g | INTRAVENOUS | Status: DC

## 2021-02-03 MED ORDER — TRANEXAMIC ACID-NACL 1000-0.7 MG/100ML-% IV SOLN: 1000.0000 mg | INTRAVENOUS | Status: DC

## 2021-02-03 MED ORDER — MIDAZOLAM HCL 2 MG/2ML IJ SOLN
INTRAMUSCULAR | Status: AC
  Filled 2021-02-03: qty 2

## 2021-02-03 MED ORDER — METOCLOPRAMIDE HCL 5 MG PO TABS: 5.0000 mg | ORAL_TABLET | Freq: Three times a day (TID) | ORAL | Status: DC | PRN

## 2021-02-03 MED ORDER — OXYCODONE HCL 5 MG PO TABS
5.0000 mg | ORAL_TABLET | ORAL | Status: DC | PRN
  Administered 2021-02-03 – 2021-02-04 (×3): 10 mg via ORAL
  Filled 2021-02-03 (×3): qty 2

## 2021-02-03 MED ORDER — 0.9 % SODIUM CHLORIDE (POUR BTL) OPTIME
TOPICAL | Status: DC | PRN
  Administered 2021-02-03: 1000 mL

## 2021-02-03 MED ORDER — SUGAMMADEX SODIUM 200 MG/2ML IV SOLN
INTRAVENOUS | Status: DC | PRN
  Administered 2021-02-03: 200 mg via INTRAVENOUS

## 2021-02-03 MED ORDER — VANCOMYCIN HCL 1000 MG IV SOLR
INTRAVENOUS | Status: DC | PRN
  Administered 2021-02-03: 1000 mg

## 2021-02-03 MED ORDER — ORAL CARE MOUTH RINSE: 15.0000 mL | Freq: Once | OROMUCOSAL | Status: AC

## 2021-02-03 MED ORDER — TRANEXAMIC ACID-NACL 1000-0.7 MG/100ML-% IV SOLN
INTRAVENOUS | Status: AC
  Filled 2021-02-03: qty 100

## 2021-02-03 MED ORDER — METHOCARBAMOL 500 MG PO TABS
500.0000 mg | ORAL_TABLET | Freq: Four times a day (QID) | ORAL | Status: DC | PRN
  Administered 2021-02-03 – 2021-02-04 (×2): 500 mg via ORAL
  Filled 2021-02-03 (×2): qty 1

## 2021-02-03 MED ORDER — FENTANYL CITRATE (PF) 250 MCG/5ML IJ SOLN
INTRAMUSCULAR | Status: AC
  Filled 2021-02-03: qty 5

## 2021-02-03 MED ORDER — METOCLOPRAMIDE HCL 5 MG/ML IJ SOLN: 5.0000 mg | Freq: Three times a day (TID) | INTRAMUSCULAR | Status: DC | PRN

## 2021-02-03 MED ORDER — PROPOFOL 10 MG/ML IV BOLUS
INTRAVENOUS | Status: DC | PRN
  Administered 2021-02-03: 40 mg via INTRAVENOUS
  Administered 2021-02-03: 20 mg via INTRAVENOUS
  Administered 2021-02-03: 30 mg via INTRAVENOUS
  Administered 2021-02-03: 180 mg via INTRAVENOUS

## 2021-02-03 MED ORDER — FENTANYL CITRATE (PF) 250 MCG/5ML IJ SOLN
INTRAMUSCULAR | Status: DC | PRN
  Administered 2021-02-03: 50 ug via INTRAVENOUS
  Administered 2021-02-03: 100 ug via INTRAVENOUS
  Administered 2021-02-03 (×2): 50 ug via INTRAVENOUS

## 2021-02-03 MED ORDER — TRANEXAMIC ACID-NACL 1000-0.7 MG/100ML-% IV SOLN
1000.0000 mg | Freq: Once | INTRAVENOUS | Status: AC
  Administered 2021-02-03: 1000 mg via INTRAVENOUS
  Filled 2021-02-03: qty 100

## 2021-02-03 MED ORDER — LIDOCAINE 2% (20 MG/ML) 5 ML SYRINGE
INTRAMUSCULAR | Status: AC
  Filled 2021-02-03: qty 5

## 2021-02-03 SURGICAL SUPPLY — 80 items
BAG COUNTER SPONGE SURGICOUNT (BAG) ×2 IMPLANT
BANDAGE ESMARK 6X9 LF (GAUZE/BANDAGES/DRESSINGS) ×1 IMPLANT
BIT DRILL CALIBRATED 4.2 (BIT) ×1 IMPLANT
BIT DRILL SHORT 4.2 (BIT) ×2 IMPLANT
BNDG COHESIVE 4X5 TAN STRL (GAUZE/BANDAGES/DRESSINGS) ×2 IMPLANT
BNDG COHESIVE 6X5 TAN STRL LF (GAUZE/BANDAGES/DRESSINGS) ×2 IMPLANT
BNDG ELASTIC 4X5.8 VLCR STR LF (GAUZE/BANDAGES/DRESSINGS) ×2 IMPLANT
BNDG ELASTIC 6X10 VLCR STRL LF (GAUZE/BANDAGES/DRESSINGS) ×2 IMPLANT
BNDG ELASTIC 6X5.8 VLCR STR LF (GAUZE/BANDAGES/DRESSINGS) ×2 IMPLANT
BNDG ESMARK 6X9 LF (GAUZE/BANDAGES/DRESSINGS) ×2
BNDG GAUZE ELAST 4 BULKY (GAUZE/BANDAGES/DRESSINGS) ×4 IMPLANT
BRUSH SCRUB EZ PLAIN DRY (MISCELLANEOUS) ×4 IMPLANT
CHLORAPREP W/TINT 26 (MISCELLANEOUS) ×2 IMPLANT
COVER MAYO STAND STRL (DRAPES) ×2 IMPLANT
COVER SURGICAL LIGHT HANDLE (MISCELLANEOUS) ×4 IMPLANT
DERMABOND ADVANCED (GAUZE/BANDAGES/DRESSINGS) ×2
DERMABOND ADVANCED .7 DNX12 (GAUZE/BANDAGES/DRESSINGS) ×2 IMPLANT
DRAPE C-ARM 42X72 X-RAY (DRAPES) ×2 IMPLANT
DRAPE C-ARMOR (DRAPES) ×2 IMPLANT
DRAPE HALF SHEET 40X57 (DRAPES) ×4 IMPLANT
DRAPE IMP U-DRAPE 54X76 (DRAPES) ×4 IMPLANT
DRAPE INCISE IOBAN 66X45 STRL (DRAPES) ×2 IMPLANT
DRAPE ORTHO SPLIT 77X108 STRL (DRAPES) ×2
DRAPE SURG 17X23 STRL (DRAPES) ×2 IMPLANT
DRAPE SURG ORHT 6 SPLT 77X108 (DRAPES) ×2 IMPLANT
DRAPE U-SHAPE 47X51 STRL (DRAPES) ×2 IMPLANT
DRESSING MEPILEX FLEX 4X4 (GAUZE/BANDAGES/DRESSINGS) ×3 IMPLANT
DRILL BIT CALIBRATED 4.2 (BIT) ×2
DRILL BIT SHORT 4.2 (BIT) ×2
DRSG ADAPTIC 3X8 NADH LF (GAUZE/BANDAGES/DRESSINGS) ×2 IMPLANT
DRSG MEPILEX FLEX 4X4 (GAUZE/BANDAGES/DRESSINGS) ×6
ELECT REM PT RETURN 9FT ADLT (ELECTROSURGICAL) ×2
ELECTRODE REM PT RTRN 9FT ADLT (ELECTROSURGICAL) ×1 IMPLANT
GAUZE SPONGE 4X4 12PLY STRL (GAUZE/BANDAGES/DRESSINGS) ×2 IMPLANT
GLOVE SURG ENC MOIS LTX SZ6.5 (GLOVE) ×6 IMPLANT
GLOVE SURG ENC MOIS LTX SZ7.5 (GLOVE) ×8 IMPLANT
GLOVE SURG UNDER POLY LF SZ6.5 (GLOVE) ×2 IMPLANT
GLOVE SURG UNDER POLY LF SZ7.5 (GLOVE) ×2 IMPLANT
GOWN STRL REUS W/ TWL LRG LVL3 (GOWN DISPOSABLE) ×2 IMPLANT
GOWN STRL REUS W/TWL LRG LVL3 (GOWN DISPOSABLE) ×2
GUIDEWIRE 3.2X400 (WIRE) ×2 IMPLANT
KIT BASIN OR (CUSTOM PROCEDURE TRAY) ×2 IMPLANT
KIT TURNOVER KIT B (KITS) ×2 IMPLANT
MANIFOLD NEPTUNE II (INSTRUMENTS) ×2 IMPLANT
NAIL IM FEM RNFA 12X380 5D (Nail) ×2 IMPLANT
NEEDLE 22X1 1/2 (OR ONLY) (NEEDLE) IMPLANT
NS IRRIG 1000ML POUR BTL (IV SOLUTION) ×2 IMPLANT
PACK ORTHO EXTREMITY (CUSTOM PROCEDURE TRAY) ×2 IMPLANT
PAD ARMBOARD 7.5X6 YLW CONV (MISCELLANEOUS) ×4 IMPLANT
PAD CAST 4YDX4 CTTN HI CHSV (CAST SUPPLIES) ×1 IMPLANT
PADDING CAST COTTON 4X4 STRL (CAST SUPPLIES) ×1
PADDING CAST COTTON 6X4 STRL (CAST SUPPLIES) ×2 IMPLANT
REAMER ROD 3.8 BALL TIP 3X950 (ORTHOPEDIC DISPOSABLE SUPPLIES) ×2 IMPLANT
SCREW LOCK HDLS 5X34 (Screw) ×2 IMPLANT
SCREW LOCK HDLS 5X48 STRL (Screw) ×2 IMPLANT
SCREW LOCK LP 5X38 (Screw) ×2 IMPLANT
SCREW LOCK THRD HDLS 5X66 (Screw) ×2 IMPLANT
SCREW LOCK THRD HDLS 5X76 (Screw) ×2 IMPLANT
SPONGE T-LAP 18X18 ~~LOC~~+RFID (SPONGE) ×2 IMPLANT
STAPLER VISISTAT 35W (STAPLE) ×2 IMPLANT
STOCKINETTE IMPERVIOUS LG (DRAPES) ×2 IMPLANT
STRIP CLOSURE SKIN 1/2X4 (GAUZE/BANDAGES/DRESSINGS) IMPLANT
SUCTION FRAZIER HANDLE 10FR (MISCELLANEOUS)
SUCTION TUBE FRAZIER 10FR DISP (MISCELLANEOUS) IMPLANT
SUT ETHILON 3 0 PS 1 (SUTURE) IMPLANT
SUT MNCRL AB 3-0 PS2 18 (SUTURE) ×2 IMPLANT
SUT MNCRL AB 3-0 PS2 27 (SUTURE) ×4 IMPLANT
SUT MON AB 2-0 CT1 36 (SUTURE) ×2 IMPLANT
SUT PDS AB 2-0 CT1 27 (SUTURE) IMPLANT
SUT VIC AB 0 CT1 27 (SUTURE)
SUT VIC AB 0 CT1 27XBRD ANBCTR (SUTURE) IMPLANT
SUT VIC AB 2-0 CT1 27 (SUTURE)
SUT VIC AB 2-0 CT1 TAPERPNT 27 (SUTURE) IMPLANT
SYR CONTROL 10ML LL (SYRINGE) IMPLANT
TOWEL GREEN STERILE (TOWEL DISPOSABLE) ×4 IMPLANT
TOWEL GREEN STERILE FF (TOWEL DISPOSABLE) ×4 IMPLANT
TUBE CONNECTING 12X1/4 (SUCTIONS) ×2 IMPLANT
UNDERPAD 30X36 HEAVY ABSORB (UNDERPADS AND DIAPERS) ×2 IMPLANT
WATER STERILE IRR 1000ML POUR (IV SOLUTION) ×4 IMPLANT
YANKAUER SUCT BULB TIP NO VENT (SUCTIONS) ×2 IMPLANT

## 2021-02-03 NOTE — Transfer of Care (Signed)
Immediate Anesthesia Transfer of Care Note  Patient: PHILIS DOKE  Procedure(s) Performed: INTRAMEDULLARY (IM) RETROGRADE FEMORAL EXCHANGE NAILING (Left) HARDWARE REMOVAL (Left)  Patient Location: PACU  Anesthesia Type:General  Level of Consciousness: drowsy  Airway & Oxygen Therapy: Patient Spontanous Breathing and Patient connected to nasal cannula oxygen  Post-op Assessment: Report given to RN and Post -op Vital signs reviewed and stable  Post vital signs: Reviewed and stable  Last Vitals:  Vitals Value Taken Time  BP 158/108 02/03/21 1046  Temp 36.7 C 02/03/21 1046  Pulse 88 02/03/21 1047  Resp 14 02/03/21 1047  SpO2 99 % 02/03/21 1047  Vitals shown include unvalidated device data.  Last Pain:  Vitals:   02/03/21 1046  TempSrc:   PainSc: 10-Worst pain ever      Patients Stated Pain Goal: 3 (02/03/21 0659)  Complications: No notable events documented.

## 2021-02-03 NOTE — Interval H&P Note (Signed)
History and Physical Interval Note:  02/03/2021 8:22 AM  Kristin Lopez  has presented today for surgery, with the diagnosis of Left femur nonunion.  The various methods of treatment have been discussed with the patient and family. After consideration of risks, benefits and other options for treatment, the patient has consented to  Procedure(s): INTRAMEDULLARY (IM) RETROGRADE FEMORAL EXCHANGE NAILING (Left) HARDWARE REMOVAL (Left) as a surgical intervention.  The patient's history has been reviewed, patient examined, no change in status, stable for surgery.  I have reviewed the patient's chart and labs.  Questions were answered to the patient's satisfaction.     Caryn Bee P Mariajose Mow

## 2021-02-03 NOTE — Anesthesia Preprocedure Evaluation (Signed)
Anesthesia Evaluation  Patient identified by MRN, date of birth, ID band Patient awake    Reviewed: Allergy & Precautions, H&P , NPO status , Patient's Chart, lab work & pertinent test results  Airway Mallampati: II   Neck ROM: full    Dental   Pulmonary neg pulmonary ROS,    breath sounds clear to auscultation       Cardiovascular negative cardio ROS   Rhythm:regular Rate:Normal     Neuro/Psych    GI/Hepatic   Endo/Other    Renal/GU      Musculoskeletal   Abdominal   Peds  Hematology   Anesthesia Other Findings   Reproductive/Obstetrics                             Anesthesia Physical Anesthesia Plan  ASA: 2  Anesthesia Plan: General   Post-op Pain Management:    Induction: Intravenous  PONV Risk Score and Plan: 3 and Ondansetron, Dexamethasone, Midazolam and Treatment may vary due to age or medical condition  Airway Management Planned: Oral ETT  Additional Equipment:   Intra-op Plan:   Post-operative Plan: Extubation in OR  Informed Consent: I have reviewed the patients History and Physical, chart, labs and discussed the procedure including the risks, benefits and alternatives for the proposed anesthesia with the patient or authorized representative who has indicated his/her understanding and acceptance.     Dental advisory given  Plan Discussed with: CRNA, Anesthesiologist and Surgeon  Anesthesia Plan Comments:         Anesthesia Quick Evaluation  

## 2021-02-03 NOTE — Op Note (Signed)
Orthopaedic Surgery Operative Note (CSN: [id number] ) Date of Surgery: 02/03/2021  Admit Date: 02/03/2021   Diagnoses: Pre-Op Diagnoses: Left femoral shaft nonunion  Post-Op Diagnosis: Same  Procedures: CPT 27470-Repair of left femoral nonunion CPT 20680-Removal of hardware left femur  Surgeons : Primary: Roby Lofts, MD  Assistant: Ulyses Southward, PA-C  Location: OR 7   Anesthesia:General   Antibiotics: Ancef 2g preop with 1 gm vancomycin powder   Tourniquet time:None    Estimated Blood Loss:100 mL  Complications:None   Specimens: ID Type Source Tests Collected by Time Destination  A : Intramedullary Reamings Tissue PATH Soft tissue AEROBIC/ANAEROBIC CULTURE W GRAM STAIN (SURGICAL/DEEP WOUND) Roby Lofts, MD 02/03/2021 [phone]      Implants: Implant Name Type Inv. Item Serial No. Manufacturer Lot No. LRB No. Used Action  SCREW LOCK HDLS 5X40 - [id number] Screw SCREW LOCK HDLS 5X40  [employer] [id number] Left 1 Implanted  RNFA/ 12 MM/380 5 DEGREE BEND     [id number] Left 1 Implanted  LOW PROF LCKNG SCREW F/IM NAIL     [id number] Left 1 Implanted  LOW PROF LCKNG SCREW F/IM NAIL     [id number] Left 1 Implanted  SCREW LOCK HDLS 5X34 - [id number] Screw SCREW LOCK HDLS 5X34  [employer] [id number] Left 1 Implanted  SCREW LOCK LP 5X38 - [id number] Screw SCREW LOCK LP 5X38  [employer] [id number] Left 1 Implanted     Indications for Surgery: 55 year old female who underwent retrograde intramedullary nailing of her bilateral femur fractures in September 2021.  She subsequently developed nonunion site of both.  She is significantly symptomatic from her left side.  Attempt was made and again stimulator but she was not able to tolerate this secondary to pain.  Due to her persistent pain and lack of progression on radiographs I recommended proceeding with exchange nailing of her left femur.  Risks and benefits were discussed with the patient.  Risks included but not limited to  bleeding, infection, persistent nonunion, hardware failure, hardware irritation, nerve or blood vessel injury, DVT, even the possibility anesthetic complications.  She agreed to proceed with surgery and consent was obtained.  Operative Findings: Exchange nailing with removal of previous intramedullary nail and placement of a Synthes RFNA 12 x 380 mm nail with reamings sent for culture.  Procedure: The patient was identified in the preoperative holding area. Consent was confirmed with the patient and their family and all questions were answered. The operative extremity was marked after confirmation with the patient. she was then brought back to the operating room by our anesthesia colleagues.  She was placed under general anesthetic and carefully transferred over to a radiolucent flat top table.  A bump was placed under her operative hip.  The left lower extremity was prepped and draped in usual sterile fashion.  A timeout was performed to verify the patient, the procedure, and the extremity.  Preoperative antibiotics were dosed.  Fluoroscopic imaging showed the persistent nonunion.  For started out by removing the distal interlocks.  The screws had no purchase they had loosened and the bone said this made it somewhat difficult to remove those however I was able to do this successfully.  I then made a medial parapatellar incision through the previous scar and entered the knee joint and proceeded to thread the extraction bolt into the distal portion of the nail.  I got this tightened and then proceeded to remove the proximal interlocking screws.  Again these were loose without any purchase in the  bone.  The nail was then removed.  I then passed a ball-tipped guidewire down the center of the canal and seated it into the proximal metaphysis.  I then decided to use a 380 mm nail to allow some play for the interlocking screw so I did not go through the same screw holes.  I then sequentially reamed from 10 mm all  the way up to 13-1/2 mm I got excellent chatter and removed some of the tissue surrounding the nail and sent this for culture.  I then used a 12 x 380 mm nail and passed this without difficulty.  I then used the targeting arm distally to place 3 distal interlocking screws from lateral to medial.  I then placed 2 proximal anterior to posterior interlocking screws and was able to get decent purchase without any difficulty.  Final fluoroscopic imaging was obtained.  The incisions were copiously irrigated.  A gram of vancomycin powder was placed into the incisions.  A layer closure of 0 Vicryl, 2-0 Vicryl 3-0 Monocryl was with Dermabond was used to close the skin.  Sterile dressings were applied.  The patient was then awoken from anesthesia and taken to the PACU in stable condition.  Post Op Plan/Instructions: Patient will be weightbearing as tolerated to the left lower extremity.  She will receive postoperative Ancef.  She will be placed on Lovenox for DVT prophylaxis and be discharged home on aspirin.  We will have her mobilize with physical therapy.  I was present and performed the entire surgery.  Ulyses Southward, PA-C did assist me throughout the case. An assistant was necessary given the difficulty in approach, maintenance of reduction and ability to instrument the fracture.   Truitt Merle, MD [facility]

## 2021-02-03 NOTE — Anesthesia Procedure Notes (Signed)
Procedure Name: Intubation Date/Time: 02/03/2021 8:36 AM Performed by: Elliot Dally, CRNA Pre-anesthesia Checklist: Patient identified, Emergency Drugs available, Suction available and Patient being monitored Patient Re-evaluated:Patient Re-evaluated prior to induction Oxygen Delivery Method: Circle System Utilized Preoxygenation: Pre-oxygenation with 100% oxygen Induction Type: IV induction Ventilation: Mask ventilation without difficulty Laryngoscope Size: Miller and 2 Grade View: Grade I Tube type: Oral Tube size: 7.0 mm Number of attempts: 1 Airway Equipment and Method: Stylet and Oral airway Placement Confirmation: ETT inserted through vocal cords under direct vision, positive ETCO2 and breath sounds checked- equal and bilateral Secured at: 22 cm Tube secured with: Tape Dental Injury: Teeth and Oropharynx as per pre-operative assessment

## 2021-02-04 ENCOUNTER — Other Ambulatory Visit (HOSPITAL_COMMUNITY): Payer: Self-pay

## 2021-02-04 LAB — CBC
HCT: 35.3 % — ABNORMAL LOW (ref 36.0–46.0)
Hemoglobin: 11.6 g/dL — ABNORMAL LOW (ref 12.0–15.0)
MCH: 27.9 pg (ref 26.0–34.0)
MCHC: 32.9 g/dL (ref 30.0–36.0)
MCV: 84.9 fL (ref 80.0–100.0)
Platelets: 221 10*3/uL (ref 150–400)
RBC: 4.16 MIL/uL (ref 3.87–5.11)
RDW: 14.5 % (ref 11.5–15.5)
WBC: 7.4 10*3/uL (ref 4.0–10.5)
nRBC: 0 % (ref 0.0–0.2)

## 2021-02-04 LAB — BASIC METABOLIC PANEL
Anion gap: 6 (ref 5–15)
BUN: 15 mg/dL (ref 6–20)
CO2: 26 mmol/L (ref 22–32)
Calcium: 8.7 mg/dL — ABNORMAL LOW (ref 8.9–10.3)
Chloride: 99 mmol/L (ref 98–111)
Creatinine, Ser: 1.04 mg/dL — ABNORMAL HIGH (ref 0.44–1.00)
GFR, Estimated: >60 mL/min (ref >60)
Glucose, Bld: 122 mg/dL — ABNORMAL HIGH (ref 70–99)
Potassium: 3.9 mmol/L (ref 3.5–5.1)
Sodium: 131 mmol/L — ABNORMAL LOW (ref 135–145)

## 2021-02-04 MED ORDER — ASPIRIN 81 MG PO TBEC
81.0000 mg | DELAYED_RELEASE_TABLET | Freq: Two times a day (BID) | ORAL | 0 refills | Status: AC
  Filled 2021-02-04: qty 60, 30d supply, fill #0

## 2021-02-04 MED ORDER — OXYCODONE-ACETAMINOPHEN 5-325 MG PO TABS
1.0000 | ORAL_TABLET | ORAL | 0 refills | Status: DC | PRN
  Filled 2021-02-04: qty 30, 5d supply, fill #0

## 2021-02-04 MED ORDER — TIZANIDINE HCL 4 MG PO TABS
4.0000 mg | ORAL_TABLET | Freq: Four times a day (QID) | ORAL | 0 refills | Status: DC | PRN
  Filled 2021-02-04: qty 40, 10d supply, fill #0

## 2021-02-04 MED ORDER — CHOLECALCIFEROL 25 MCG (1000 UT) PO TABS
1000.0000 [IU] | ORAL_TABLET | Freq: Every day | ORAL | 2 refills | Status: AC
  Filled 2021-02-04: qty 30, 30d supply, fill #0

## 2021-02-04 NOTE — Progress Notes (Signed)
Patient alert and oriented, voiding adequately, skin clean, dry and intact without evidence of skin break down, or symptoms of complications - no redness or edema noted, only slight tenderness at site.  Patient states pain is manageable at time of discharge. Patient has an appointment with MD in 2 weeks 

## 2021-02-04 NOTE — Discharge Summary (Addendum)
Orthopaedic Trauma Service (OTS) Discharge Summary   Patient ID: Kristin Lopez MRN: [id number] DOB/AGE: 08/11/1965 55 y.o.  Admit date: 02/03/2021 Discharge date: 02/04/2021  Admission Diagnoses: Left femoral shaft nonunion  Discharge Diagnoses:  Principal Problem:   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 Active Problems:   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   Past Medical History:  Diagnosis Date   Hyperlipidemia      Procedures Performed:  CPT 27470-Repair of left femoral nonunion CPT 20680-Removal of hardware left femur  Discharged Condition: stable  Hospital Course: Patient presented to [facility] on 02/03/2021 for scheduled procedure on left leg.  Was taken to the operating room by Dr. Jena Gauss for the above procedure and tolerated this well without complications.  Was admitted overnight for pain control and therapies.  Was started on Lovenox for DVT prophylaxis starting on postoperative day 1.  Began working with physical and occupational therapy starting on postoperative day #1. On 02/04/2021, the patient was tolerating diet, working well with therapies, pain well controlled, vital signs stable, dressings clean, dry, intact and felt stable for discharge to home. Patient will follow up as below and knows to call with questions or concerns.     Consults: None  Significant Diagnostic Studies:   Results for orders placed or performed during the hospital encounter of 02/03/21 (from the past 168 hour(s))  Aerobic/Anaerobic Culture w Gram Stain (surgical/deep wound)   Collection Time: 02/03/21  9:43 AM   Specimen: PATH Soft tissue  Result Value Ref Range   Specimen Description TISSUE    Special Requests INTRAMEDULLARY REAMINGS SPEC A    Gram Stain      RARE WBC PRESENT, PREDOMINANTLY MONONUCLEAR NO ORGANISMS SEEN Performed at [facility], [address], [place], [place] [address]    Culture  PENDING    Report Status PENDING   CBC   Collection Time: 02/03/21  5:04 PM  Result Value Ref Range   WBC 9.5 4.0 - 10.5 K/uL   RBC 4.65 3.87 - 5.11 MIL/uL   Hemoglobin 12.4 12.0 - 15.0 g/dL   HCT 74.1 63.8 - 45.3 %   MCV 85.6 80.0 - 100.0 fL   MCH 26.7 26.0 - 34.0 pg   MCHC 31.2 30.0 - 36.0 g/dL   RDW 64.6 80.3 - 21.2 %   Platelets 248 150 - 400 K/uL   nRBC 0.0 0.0 - 0.2 %  Creatinine, serum   Collection Time: 02/03/21  5:04 PM  Result Value Ref Range   Creatinine, Ser 0.96 0.44 - 1.00 mg/dL   GFR, Estimated >24 >82 mL/min  Basic metabolic panel   Collection Time: 02/04/21  4:37 AM  Result Value Ref Range   Sodium 131 (L) 135 - 145 mmol/L   Potassium 3.9 3.5 - 5.1 mmol/L   Chloride 99 98 - 111 mmol/L   CO2 26 22 - 32 mmol/L   Glucose, Bld 122 (H) 70 - 99 mg/dL   BUN 15 6 - 20 mg/dL   Creatinine, Ser 5.00 (H) 0.44 - 1.00 mg/dL   Calcium 8.7 (L) 8.9 - 10.3 mg/dL   GFR, Estimated >37 >04 mL/min   Anion gap 6 5 - 15  CBC   Collection Time: 02/04/21  4:37 AM  Result Value Ref Range   WBC 7.4 4.0 - 10.5 K/uL   RBC 4.16 3.87 - 5.11 MIL/uL   Hemoglobin 11.6 (L) 12.0 - 15.0 g/dL  HCT 35.3 (L) 36.0 - 46.0 %   MCV 84.9 80.0 - 100.0 fL   MCH 27.9 26.0 - 34.0 pg   MCHC 32.9 30.0 - 36.0 g/dL   RDW 14.5 11.5 - 15.5 %   Platelets 221 150 - 400 K/uL   nRBC 0.0 0.0 - 0.2 %  Results for orders placed or performed during the hospital encounter of 02/01/21 (from the past 168 hour(s))  TSH   Collection Time: 02/01/21  8:32 AM  Result Value Ref Range   TSH 1.158 0.350 - 4.500 uIU/mL  Sedimentation rate   Collection Time: 02/01/21  8:32 AM  Result Value Ref Range   Sed Rate 24 (H) 0 - 22 mm/hr  C-reactive protein   Collection Time: 02/01/21  8:32 AM  Result Value Ref Range   CRP 0.8 <1.0 mg/dL  Hemoglobin A1c   Collection Time: 02/01/21  8:32 AM  Result Value Ref Range   Hgb A1c MFr Bld 6.0 (H) 4.8 - 5.6 %   Mean Plasma Glucose 125.5 mg/dL  VITAMIN D 25 Hydroxy (Vit-D  Deficiency, Fractures)   Collection Time: 02/01/21  8:32 AM  Result Value Ref Range   Vit D, 25-Hydroxy 22.07 (L) 30 - 100 ng/mL  CBC WITH DIFFERENTIAL   Collection Time: 02/01/21  8:32 AM  Result Value Ref Range   WBC 5.5 4.0 - 10.5 K/uL   RBC 4.90 3.87 - 5.11 MIL/uL   Hemoglobin 13.5 12.0 - 15.0 g/dL   HCT 42.9 36.0 - 46.0 %   MCV 87.6 80.0 - 100.0 fL   MCH 27.6 26.0 - 34.0 pg   MCHC 31.5 30.0 - 36.0 g/dL   RDW 14.5 11.5 - 15.5 %   Platelets 261 150 - 400 K/uL   nRBC 0.0 0.0 - 0.2 %   Neutrophils Relative % 47 %   Neutro Abs 2.6 1.7 - 7.7 K/uL   Lymphocytes Relative 41 %   Lymphs Abs 2.2 0.7 - 4.0 K/uL   Monocytes Relative 9 %   Monocytes Absolute 0.5 0.1 - 1.0 K/uL   Eosinophils Relative 2 %   Eosinophils Absolute 0.1 0.0 - 0.5 K/uL   Basophils Relative 1 %   Basophils Absolute 0.0 0.0 - 0.1 K/uL   Immature Granulocytes 0 %   Abs Immature Granulocytes 0.01 0.00 - 0.07 K/uL  Basic metabolic panel   Collection Time: 02/01/21  8:32 AM  Result Value Ref Range   Sodium 135 135 - 145 mmol/L   Potassium 4.0 3.5 - 5.1 mmol/L   Chloride 102 98 - 111 mmol/L   CO2 26 22 - 32 mmol/L   Glucose, Bld 83 70 - 99 mg/dL   BUN 18 6 - 20 mg/dL   Creatinine, Ser 0.95 0.44 - 1.00 mg/dL   Calcium 9.4 8.9 - 10.3 mg/dL   GFR, Estimated >60 >60 mL/min   Anion gap 7 5 - 15  Protime-INR   Collection Time: 02/01/21  8:32 AM  Result Value Ref Range   Prothrombin Time 13.3 11.4 - 15.2 seconds   INR 1.0 0.8 - 1.2  SARS CORONAVIRUS 2 (TAT 6-24 HRS) Nasopharyngeal Nasopharyngeal Swab   Collection Time: 02/01/21  9:09 AM   Specimen: Nasopharyngeal Swab  Result Value Ref Range   SARS Coronavirus 2 NEGATIVE NEGATIVE     Treatments: IV hydration, antibiotics: Ancef, analgesia: acetaminophen, Dilaudid, Toradol, and oxycodone, anticoagulation: LMW heparin, therapies: PT and OT, and surgery: As above  Discharge Exam: General: Sitting up in bed, no  acute distress Respiratory: No increased work of  breathing at rest L LE: Dressing clean, dry, intact.  Tenderness throughout the thigh as expected.  Ankle DF/PF intact.  Endorses sensation to light touch throughout extremity.  Compartments are soft and compressible.  Foot warm and well-perfused.  Neurovascularly intact.  Disposition: Discharge disposition: 01-Home or Self Care       Allergies as of 02/04/2021       Reactions   Penicillins Hives   Sulfate Hives        Medication List     TAKE these medications    aspirin EC 81 MG tablet Take 1 tablet (81 mg total) by mouth in the morning and at bedtime. Swallow whole.   cetirizine 10 MG tablet Commonly known as: ZYRTEC Take 10 mg by mouth daily.   cholecalciferol 25 MCG (1000 UNIT) tablet Commonly known as: VITAMIN D Take 1 tablet (1,000 Units total) by mouth daily.   fluticasone 50 MCG/ACT nasal spray Commonly known as: FLONASE Place 2 sprays into both nostrils in the morning.   oxyCODONE-acetaminophen 5-325 MG tablet Commonly known as: Percocet Take 1 tablet by mouth every 4 (four) hours as needed for severe pain.   tiZANidine 4 MG tablet Commonly known as: ZANAFLEX Take 1 tablet (4 mg total) by mouth every 6 (six) hours as needed for muscle spasms. What changed: when to take this        Follow-up Information     Haddix, Thomasene Lot, MD. Schedule an appointment as soon as possible for a visit in 2 week(s).   Specialty: Orthopedic Surgery Why: for repeat x-rays and wound check Contact information: [place] [address] [phone]                 Discharge Instructions and Plan: Patient will be discharged to home.  We will continue weightbearing as tolerated on the left lower extremity. Will be discharged on Aspirin 81 mg for DVT prophylaxis. Patient has been provided with all the necessary DME for discharge. Patient will follow up with Dr. Doreatha Martin in 2 weeks for repeat x-rays and wound check.   Signed:  Leary Roca. Carmie Kanner ?([phone]? (phone) 02/04/2021, 7:36 AM  [facility] [place] [address] [phone] (F)

## 2021-02-04 NOTE — Discharge Instructions (Signed)
[facility] Discharge Instructions   General Discharge Instructions  WEIGHT BEARING STATUS: Weightbearing as tolerated left lower extremity  RANGE OF MOTION/ACTIVITY: Okay for hip and knee motion as tolerated  Wound Care: You may remove your surgical dressing on postop day #2 (Friday, 02/05/2021).  Your incisions are closed using dissolvable suture and skin glue.  Incisions can be left open to air if there is no drainage. If incision continues to have drainage, follow wound care instructions below. Okay to shower if no drainage from incisions.  DVT/PE prophylaxis: Aspirin 81 mg twice daily x30 days  Diet: as you were eating previously.  Can use over the counter stool softeners and bowel preparations, such as Miralax, to help with bowel movements.  Narcotics can be constipating.  Be sure to drink plenty of fluids  PAIN MEDICATION USE AND EXPECTATIONS  You have likely been given narcotic medications to help control your pain.  After a traumatic event that results in an fracture (broken bone) with or without surgery, it is ok to use narcotic pain medications to help control one's pain.  We understand that everyone responds to pain differently and each individual patient will be evaluated on a regular basis for the continued need for narcotic medications. Ideally, narcotic medication use should last no more than 6-8 weeks (coinciding with fracture healing).   As a patient it is your responsibility as well to monitor narcotic medication use and report the amount and frequency you use these medications when you come to your office visit.   We would also advise that if you are using narcotic medications, you should take a dose prior to therapy to maximize you participation.  IF YOU ARE ON NARCOTIC MEDICATIONS IT IS NOT PERMISSIBLE TO OPERATE A MOTOR VEHICLE (MOTORCYCLE/CAR/TRUCK/MOPED) OR HEAVY MACHINERY DO NOT MIX NARCOTICS WITH OTHER CNS (CENTRAL NERVOUS SYSTEM) DEPRESSANTS SUCH AS  ALCOHOL   STOP SMOKING OR USING NICOTINE PRODUCTS!!!!  As discussed nicotine severely impairs your body's ability to heal surgical and traumatic wounds but also impairs bone healing.  Wounds and bone heal by forming microscopic blood vessels (angiogenesis) and nicotine is a vasoconstrictor (essentially, shrinks blood vessels).  Therefore, if vasoconstriction occurs to these microscopic blood vessels they essentially disappear and are unable to deliver necessary nutrients to the healing tissue.  This is one modifiable factor that you can do to dramatically increase your chances of healing your injury.    (This means no smoking, no nicotine gum, patches, etc)  DO NOT USE NONSTEROIDAL ANTI-INFLAMMATORY DRUGS (NSAID'S)  Using products such as Advil (ibuprofen), Aleve (naproxen), Motrin (ibuprofen) for additional pain control during fracture healing can delay and/or prevent the healing response.  If you would like to take over the counter (OTC) medication, Tylenol (acetaminophen) is ok.  However, some narcotic medications that are given for pain control contain acetaminophen as well. Therefore, you should not exceed more than 4000 mg of tylenol in a day if you do not have liver disease.  Also note that there are may OTC medicines, such as cold medicines and allergy medicines that my contain tylenol as well.  If you have any questions about medications and/or interactions please ask your doctor/PA or your pharmacist.      ICE AND ELEVATE INJURED/OPERATIVE EXTREMITY  Using ice and elevating the injured extremity above your heart can help with swelling and pain control.  Icing in a pulsatile fashion, such as 20 minutes on and 20 minutes off, can be followed.    Do  not place ice directly on skin. Make sure there is a barrier between to skin and the ice pack.    Using frozen items such as frozen peas works well as the conform nicely to the are that needs to be iced.  USE AN ACE WRAP OR TED HOSE FOR SWELLING  CONTROL  In addition to icing and elevation, Ace wraps or TED hose are used to help limit and resolve swelling.  It is recommended to use Ace wraps or TED hose until you are informed to stop.    When using Ace Wraps start the wrapping distally (farthest away from the body) and wrap proximally (closer to the body)   Example: If you had surgery on your leg or thing and you do not have a splint on, start the ace wrap at the toes and work your way up to the thigh        If you had surgery on your upper extremity and do not have a splint on, start the ace wrap at your fingers and work your way up to the upper arm   CALL THE OFFICE WITH ANY QUESTIONS OR CONCERNS: [phone]   VISIT OUR WEBSITE FOR ADDITIONAL INFORMATION: [email or web address]    Discharge Wound Care Instructions  Do NOT apply any ointments, solutions or lotions to pin sites or surgical wounds.  These prevent needed drainage and even though solutions like hydrogen peroxide kill bacteria, they also damage cells lining the pin sites that help fight infection.  Applying lotions or ointments can keep the wounds moist and can cause them to breakdown and open up as well. This can increase the risk for infection. When in doubt call the office.  If any drainage is noted, use one layer of adaptic, then gauze, Kerlix, and an ace wrap.  Once the incision is completely dry and without drainage, it may be left open to air out.  Showering may begin 36-48 hours later.  Cleaning gently with soap and water.
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
## 2021-02-04 NOTE — Anesthesia Postprocedure Evaluation (Signed)
Anesthesia Post Note  Patient: Kristin Lopez  Procedure(s) Performed: INTRAMEDULLARY (IM) RETROGRADE FEMORAL EXCHANGE NAILING (Left) HARDWARE REMOVAL (Left)     Patient location during evaluation: PACU Anesthesia Type: General Level of consciousness: awake and alert Pain management: pain level controlled Vital Signs Assessment: post-procedure vital signs reviewed and stable Respiratory status: spontaneous breathing, nonlabored ventilation, respiratory function stable and patient connected to nasal cannula oxygen Cardiovascular status: blood pressure returned to baseline and stable Postop Assessment: no apparent nausea or vomiting Anesthetic complications: no   No notable events documented.  Last Vitals:  Vitals:   02/04/21 0325 02/04/21 0717  BP: 101/70 109/68  Pulse: 80 67  Resp: 20 18  Temp: 36.9 C 37.1 C  SpO2: 98% 99%    Last Pain:  Vitals:   02/04/21 0717  TempSrc: Oral  PainSc:                  Valin Massie S
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
## 2021-02-08 LAB — AEROBIC/ANAEROBIC CULTURE W GRAM STAIN (SURGICAL/DEEP WOUND): Culture: NO GROWTH

## 2021-02-09 ENCOUNTER — Encounter (HOSPITAL_COMMUNITY): Payer: Self-pay | Admitting: Student

## 2021-05-13 ENCOUNTER — Ambulatory Visit: Payer: Medicaid Other | Attending: Student | Admitting: Physical Therapy

## 2021-05-13 ENCOUNTER — Encounter: Payer: Self-pay | Admitting: Physical Therapy

## 2021-05-13 ENCOUNTER — Other Ambulatory Visit: Payer: Self-pay

## 2021-05-13 DIAGNOSIS — Z9181 History of falling: Secondary | ICD-10-CM | POA: Diagnosis present

## 2021-05-13 DIAGNOSIS — M79652 Pain in left thigh: Secondary | ICD-10-CM | POA: Diagnosis present

## 2021-05-13 DIAGNOSIS — M6281 Muscle weakness (generalized): Secondary | ICD-10-CM | POA: Insufficient documentation

## 2021-05-13 DIAGNOSIS — R262 Difficulty in walking, not elsewhere classified: Secondary | ICD-10-CM | POA: Diagnosis present
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
## 2021-05-17 ENCOUNTER — Ambulatory Visit: Payer: Medicaid Other | Admitting: Physical Therapy

## 2021-05-19 ENCOUNTER — Ambulatory Visit: Payer: Medicaid Other | Admitting: Physical Therapy

## 2021-05-19 ENCOUNTER — Encounter: Payer: Self-pay | Admitting: Physical Therapy

## 2021-05-19 ENCOUNTER — Other Ambulatory Visit: Payer: Self-pay

## 2021-05-19 DIAGNOSIS — Z9181 History of falling: Secondary | ICD-10-CM

## 2021-05-19 DIAGNOSIS — M79652 Pain in left thigh: Secondary | ICD-10-CM

## 2021-05-19 DIAGNOSIS — M6281 Muscle weakness (generalized): Secondary | ICD-10-CM | POA: Diagnosis not present

## 2021-05-19 DIAGNOSIS — R262 Difficulty in walking, not elsewhere classified: Secondary | ICD-10-CM
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
## 2021-05-24 ENCOUNTER — Encounter: Payer: Self-pay | Admitting: Physical Therapy

## 2021-05-24 ENCOUNTER — Ambulatory Visit: Payer: Medicaid Other | Admitting: Physical Therapy

## 2021-05-24 ENCOUNTER — Other Ambulatory Visit: Payer: Self-pay

## 2021-05-24 DIAGNOSIS — Z9181 History of falling: Secondary | ICD-10-CM

## 2021-05-24 DIAGNOSIS — M6281 Muscle weakness (generalized): Secondary | ICD-10-CM | POA: Diagnosis not present

## 2021-05-24 DIAGNOSIS — M79652 Pain in left thigh: Secondary | ICD-10-CM

## 2021-05-24 DIAGNOSIS — R262 Difficulty in walking, not elsewhere classified: Secondary | ICD-10-CM
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
## 2021-05-26 ENCOUNTER — Other Ambulatory Visit: Payer: Self-pay

## 2021-05-26 ENCOUNTER — Ambulatory Visit: Payer: Medicaid Other | Admitting: Physical Therapy

## 2021-05-26 DIAGNOSIS — R262 Difficulty in walking, not elsewhere classified: Secondary | ICD-10-CM

## 2021-05-26 DIAGNOSIS — M6281 Muscle weakness (generalized): Secondary | ICD-10-CM

## 2021-05-26 DIAGNOSIS — Z9181 History of falling: Secondary | ICD-10-CM

## 2021-05-26 DIAGNOSIS — M79652 Pain in left thigh: Secondary | ICD-10-CM
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
## 2021-05-28 ENCOUNTER — Encounter: Payer: Self-pay | Admitting: Physical Therapy

## 2021-05-31 ENCOUNTER — Encounter: Payer: Self-pay | Admitting: Physical Therapy

## 2021-05-31 ENCOUNTER — Ambulatory Visit: Payer: Medicaid Other | Attending: Student | Admitting: Physical Therapy

## 2021-05-31 DIAGNOSIS — Z9181 History of falling: Secondary | ICD-10-CM | POA: Diagnosis present

## 2021-05-31 DIAGNOSIS — R262 Difficulty in walking, not elsewhere classified: Secondary | ICD-10-CM | POA: Diagnosis present

## 2021-05-31 DIAGNOSIS — M6281 Muscle weakness (generalized): Secondary | ICD-10-CM | POA: Diagnosis present

## 2021-05-31 DIAGNOSIS — M79652 Pain in left thigh: Secondary | ICD-10-CM | POA: Diagnosis present
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
## 2021-06-02 ENCOUNTER — Encounter: Payer: Medicaid Other | Admitting: Physical Therapy

## 2021-06-03 ENCOUNTER — Ambulatory Visit: Payer: Medicaid Other | Admitting: Physical Therapy

## 2021-06-03 DIAGNOSIS — M79652 Pain in left thigh: Secondary | ICD-10-CM

## 2021-06-03 DIAGNOSIS — M6281 Muscle weakness (generalized): Secondary | ICD-10-CM | POA: Diagnosis not present

## 2021-06-03 DIAGNOSIS — R262 Difficulty in walking, not elsewhere classified: Secondary | ICD-10-CM

## 2021-06-03 DIAGNOSIS — Z9181 History of falling: Secondary | ICD-10-CM
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
## 2021-06-06 ENCOUNTER — Encounter: Payer: Self-pay | Admitting: Physical Therapy

## 2021-06-07 ENCOUNTER — Ambulatory Visit: Payer: Medicaid Other | Admitting: Physical Therapy
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
## 2021-06-09 ENCOUNTER — Encounter: Payer: Self-pay | Admitting: Physical Therapy

## 2021-06-09 ENCOUNTER — Ambulatory Visit: Payer: Medicaid Other | Admitting: Physical Therapy

## 2021-06-09 DIAGNOSIS — Z9181 History of falling: Secondary | ICD-10-CM

## 2021-06-09 DIAGNOSIS — M6281 Muscle weakness (generalized): Secondary | ICD-10-CM | POA: Diagnosis not present

## 2021-06-09 DIAGNOSIS — R262 Difficulty in walking, not elsewhere classified: Secondary | ICD-10-CM

## 2021-06-09 DIAGNOSIS — M79652 Pain in left thigh: Secondary | ICD-10-CM
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
## 2021-06-14 ENCOUNTER — Ambulatory Visit: Payer: Medicaid Other | Admitting: Physical Therapy

## 2021-06-14 ENCOUNTER — Encounter: Payer: Self-pay | Admitting: Physical Therapy

## 2021-06-14 DIAGNOSIS — M6281 Muscle weakness (generalized): Secondary | ICD-10-CM | POA: Diagnosis not present

## 2021-06-14 DIAGNOSIS — R262 Difficulty in walking, not elsewhere classified: Secondary | ICD-10-CM

## 2021-06-14 DIAGNOSIS — Z9181 History of falling: Secondary | ICD-10-CM

## 2021-06-14 DIAGNOSIS — M79652 Pain in left thigh: Secondary | ICD-10-CM
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
## 2021-06-15 ENCOUNTER — Ambulatory Visit: Payer: Self-pay | Admitting: Student

## 2021-06-16 ENCOUNTER — Encounter: Payer: Self-pay | Admitting: Physical Therapy

## 2021-06-16 ENCOUNTER — Ambulatory Visit: Payer: Medicaid Other | Admitting: Physical Therapy

## 2021-06-16 DIAGNOSIS — Z9181 History of falling: Secondary | ICD-10-CM

## 2021-06-16 DIAGNOSIS — M6281 Muscle weakness (generalized): Secondary | ICD-10-CM

## 2021-06-16 DIAGNOSIS — M79652 Pain in left thigh: Secondary | ICD-10-CM

## 2021-06-16 DIAGNOSIS — R262 Difficulty in walking, not elsewhere classified: Secondary | ICD-10-CM
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
## 2021-06-21 ENCOUNTER — Ambulatory Visit: Payer: Medicaid Other | Admitting: Physical Therapy

## 2021-06-21 ENCOUNTER — Encounter: Payer: Self-pay | Admitting: Physical Therapy

## 2021-06-21 DIAGNOSIS — R262 Difficulty in walking, not elsewhere classified: Secondary | ICD-10-CM

## 2021-06-21 DIAGNOSIS — M79652 Pain in left thigh: Secondary | ICD-10-CM

## 2021-06-21 DIAGNOSIS — M6281 Muscle weakness (generalized): Secondary | ICD-10-CM

## 2021-06-21 DIAGNOSIS — Z9181 History of falling: Secondary | ICD-10-CM
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
## 2021-06-23 ENCOUNTER — Encounter: Payer: Self-pay | Admitting: Physical Therapy

## 2021-06-23 ENCOUNTER — Ambulatory Visit: Payer: Medicaid Other | Admitting: Physical Therapy

## 2021-06-23 DIAGNOSIS — M6281 Muscle weakness (generalized): Secondary | ICD-10-CM

## 2021-06-23 DIAGNOSIS — Z9181 History of falling: Secondary | ICD-10-CM

## 2021-06-23 DIAGNOSIS — M79652 Pain in left thigh: Secondary | ICD-10-CM

## 2021-06-23 DIAGNOSIS — R262 Difficulty in walking, not elsewhere classified: Secondary | ICD-10-CM
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
## 2021-06-28 ENCOUNTER — Ambulatory Visit: Payer: Medicaid Other | Attending: Student | Admitting: Physical Therapy

## 2021-06-28 DIAGNOSIS — R262 Difficulty in walking, not elsewhere classified: Secondary | ICD-10-CM | POA: Insufficient documentation

## 2021-06-28 DIAGNOSIS — M79604 Pain in right leg: Secondary | ICD-10-CM | POA: Insufficient documentation

## 2021-06-28 DIAGNOSIS — M79651 Pain in right thigh: Secondary | ICD-10-CM | POA: Insufficient documentation

## 2021-06-28 DIAGNOSIS — M6281 Muscle weakness (generalized): Secondary | ICD-10-CM | POA: Insufficient documentation

## 2021-06-28 DIAGNOSIS — R2689 Other abnormalities of gait and mobility: Secondary | ICD-10-CM | POA: Insufficient documentation

## 2021-06-28 DIAGNOSIS — Z9181 History of falling: Secondary | ICD-10-CM | POA: Insufficient documentation

## 2021-06-28 DIAGNOSIS — R2681 Unsteadiness on feet: Secondary | ICD-10-CM | POA: Insufficient documentation
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
## 2021-06-29 ENCOUNTER — Other Ambulatory Visit: Payer: Self-pay

## 2021-06-29 ENCOUNTER — Encounter (HOSPITAL_COMMUNITY): Payer: Self-pay | Admitting: Student

## 2021-06-29 NOTE — Progress Notes (Signed)
PCP - Ulyess Mort, FNP ?Cardiologist -  ?EKG -  ?Chest x-ray -  ?ECHO -  ?Cardiac Cath -  ?CPAP -  ? ?ERAS Protcol - n/a ?COVID TEST- n/a ? ?Anesthesia review: n/a ? ?------------- ? ?SDW INSTRUCTIONS: ? ?Your procedure is scheduled on Wed 5/3. Please report to [facility] Main Entrance "A" at [place].M., and check in at the Admitting office. Call this number if you have problems the morning of surgery: [phone] ? ? ?Remember: Do not eat or drink after midnight the night before your surgery ?  ?Medications to take morning of surgery with a sip of water include: ?montelukast (SINGULAIR)  ?tiZANidine (ZANAFLEX) if needed ? ? ?As of today, STOP taking any Aspirin (unless otherwise instructed by your surgeon), Aleve, Naproxen, Ibuprofen, Motrin, Advil, Goody's, BC's, all herbal medications, fish oil, and all vitamins. ? ?  ?The Morning of Surgery ?Do not wear jewelry, make-up or nail polish. ?Do not wear lotions, powders, or perfumes, or deodorant ?Do not bring valuables to the hospital. ?[facility] is not responsible for any belongings or valuables. ? ?If you are a smoker, DO NOT Smoke 24 hours prior to surgery ? ?If you wear a CPAP at night please bring your mask the morning of surgery  ? ?Remember that you must have someone to transport you home after your surgery, and remain with you for 24 hours if you are discharged the same day. ? ?Please bring cases for contacts, glasses, hearing aids, dentures or bridgework because it cannot be worn into surgery.  ? ?Patients discharged the day of surgery will not be allowed to drive home.  ? ?Please shower the NIGHT BEFORE/MORNING OF SURGERY (use antibacterial soap like DIAL soap if possible). Wear comfortable clothes the morning of surgery. Oral Hygiene is also important to reduce your risk of infection.  Remember - BRUSH YOUR TEETH THE MORNING OF SURGERY WITH YOUR REGULAR TOOTHPASTE ? ?Patient denies shortness of breath, fever, cough and chest pain.  ? ? ?   ? ?

## 2021-06-29 NOTE — Anesthesia Preprocedure Evaluation (Addendum)
Anesthesia Evaluation  ?Patient identified by MRN, date of birth, ID band ?Patient awake ? ? ? ?Reviewed: ?Allergy & Precautions, NPO status , Patient's Chart, lab work & pertinent test results ? ?History of Anesthesia Complications ?Negative for: history of anesthetic complications ? ?Airway ?Mallampati: II ? ?TM Distance: >3 FB ?Neck ROM: Full ? ? ? Dental ? ?(+) Dental Advisory Given ?  ?Pulmonary ?neg pulmonary ROS,  ?  ?Pulmonary exam normal ? ? ? ? ? ? ? Cardiovascular ?negative cardio ROS ?Normal cardiovascular exam ? ? ?  ?Neuro/Psych ?negative neurological ROS ? negative psych ROS  ? GI/Hepatic ?negative GI ROS, Neg liver ROS,   ?Endo/Other  ? ?Obesity ? ? Renal/GU ?negative Renal ROS  ? ?  ?Musculoskeletal ?negative musculoskeletal ROS ?(+)  ? Abdominal ?  ?Peds ? Hematology ?negative hematology ROS ?(+)   ?Anesthesia Other Findings ? ? Reproductive/Obstetrics ? ?  ? ? ? ? ? ? ? ? ? ? ? ? ? ?  ?  ? ? ? ? ? ? ? ?Anesthesia Physical ?Anesthesia Plan ? ?ASA: 2 ? ?Anesthesia Plan: General  ? ?Post-op Pain Management: Tylenol PO (pre-op)*  ? ?Induction: Intravenous ? ?PONV Risk Score and Plan: 3 and Treatment may vary due to age or medical condition, Ondansetron, Dexamethasone and Midazolam ? ?Airway Management Planned: Oral ETT ? ?Additional Equipment: None ? ?Intra-op Plan:  ? ?Post-operative Plan: Extubation in OR ? ?Informed Consent: I have reviewed the patients History and Physical, chart, labs and discussed the procedure including the risks, benefits and alternatives for the proposed anesthesia with the patient or authorized representative who has indicated his/her understanding and acceptance.  ? ? ? ?Dental advisory given ? ?Plan Discussed with: CRNA and Anesthesiologist ? ?Anesthesia Plan Comments:   ? ? ? ? ? ?Anesthesia Quick Evaluation ? ?
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
## 2021-06-30 ENCOUNTER — Observation Stay (HOSPITAL_COMMUNITY)
Admission: RE | Admit: 2021-06-30 | Discharge: 2021-07-01 | Disposition: A | Discharge status: Home / Self Care | Payer: Medicaid Other | Attending: Student | Admitting: Student

## 2021-06-30 ENCOUNTER — Ambulatory Visit (HOSPITAL_COMMUNITY): Payer: Medicaid Other

## 2021-06-30 ENCOUNTER — Encounter (HOSPITAL_COMMUNITY): Payer: Self-pay | Admitting: Student

## 2021-06-30 ENCOUNTER — Ambulatory Visit (HOSPITAL_BASED_OUTPATIENT_CLINIC_OR_DEPARTMENT_OTHER): Payer: Medicaid Other | Admitting: Anesthesiology

## 2021-06-30 ENCOUNTER — Ambulatory Visit (HOSPITAL_COMMUNITY): Payer: Medicaid Other | Admitting: Anesthesiology

## 2021-06-30 ENCOUNTER — Encounter (HOSPITAL_COMMUNITY)
Admission: RE | Disposition: A | Discharge status: Home / Self Care | Payer: Self-pay | Source: Home / Self Care | Attending: Student

## 2021-06-30 ENCOUNTER — Other Ambulatory Visit: Payer: Self-pay

## 2021-06-30 ENCOUNTER — Observation Stay (HOSPITAL_COMMUNITY): Payer: Medicaid Other

## 2021-06-30 DIAGNOSIS — S72301K Unspecified fracture of shaft of right femur, subsequent encounter for closed fracture with nonunion: Principal | ICD-10-CM | POA: Insufficient documentation

## 2021-06-30 DIAGNOSIS — S7291XK Unspecified fracture of right femur, subsequent encounter for closed fracture with nonunion: Secondary | ICD-10-CM | POA: Diagnosis not present

## 2021-06-30 DIAGNOSIS — S72351K Displaced comminuted fracture of shaft of right femur, subsequent encounter for closed fracture with nonunion: Secondary | ICD-10-CM | POA: Diagnosis present

## 2021-06-30 HISTORY — PX: FEMUR IM NAIL: SHX1597

## 2021-06-30 LAB — CBC
HCT: 39.9 % (ref 36.0–46.0)
Hemoglobin: 12.7 g/dL (ref 12.0–15.0)
MCH: 27.1 pg (ref 26.0–34.0)
MCHC: 31.8 g/dL (ref 30.0–36.0)
MCV: 85.3 fL (ref 80.0–100.0)
Platelets: 209 10*3/uL (ref 150–400)
RBC: 4.68 MIL/uL (ref 3.87–5.11)
RDW: 14.8 % (ref 11.5–15.5)
WBC: 5.3 10*3/uL (ref 4.0–10.5)
nRBC: 0 % (ref 0.0–0.2)

## 2021-06-30 SURGERY — INSERTION, INTRAMEDULLARY ROD, FEMUR, RETROGRADE
Anesthesia: General | Laterality: Right

## 2021-06-30 MED ORDER — FLUTICASONE PROPIONATE 50 MCG/ACT NA SUSP
2.0000 | Freq: Every day | NASAL | Status: DC
  Administered 2021-07-01: 2 via NASAL
  Filled 2021-06-30: qty 16

## 2021-06-30 MED ORDER — LIDOCAINE 2% (20 MG/ML) 5 ML SYRINGE
INTRAMUSCULAR | Status: DC | PRN
  Administered 2021-06-30: 60 mg via INTRAVENOUS

## 2021-06-30 MED ORDER — MIDAZOLAM HCL 2 MG/2ML IJ SOLN
INTRAMUSCULAR | Status: DC | PRN
  Administered 2021-06-30: 2 mg via INTRAVENOUS

## 2021-06-30 MED ORDER — ORAL CARE MOUTH RINSE: 15.0000 mL | Freq: Once | OROMUCOSAL | Status: AC

## 2021-06-30 MED ORDER — ONDANSETRON HCL 4 MG/2ML IJ SOLN
4.0000 mg | Freq: Four times a day (QID) | INTRAMUSCULAR | Status: DC | PRN
Start: 2021-06-30 — End: 2021-07-01

## 2021-06-30 MED ORDER — ONDANSETRON HCL 4 MG/2ML IJ SOLN: 4.0000 mg | Freq: Once | INTRAMUSCULAR | Status: DC | PRN

## 2021-06-30 MED ORDER — SUGAMMADEX SODIUM 200 MG/2ML IV SOLN
INTRAVENOUS | Status: DC | PRN
Start: 2021-06-30 — End: 2021-06-30
  Administered 2021-06-30: 424.4 mg via INTRAVENOUS

## 2021-06-30 MED ORDER — DEXMEDETOMIDINE (PRECEDEX) IN NS 20 MCG/5ML (4 MCG/ML) IV SYRINGE
PREFILLED_SYRINGE | INTRAVENOUS | Status: DC | PRN
  Administered 2021-06-30: 8 ug via INTRAVENOUS

## 2021-06-30 MED ORDER — FENTANYL CITRATE (PF) 250 MCG/5ML IJ SOLN
INTRAMUSCULAR | Status: DC | PRN
  Administered 2021-06-30 (×5): 50 ug via INTRAVENOUS

## 2021-06-30 MED ORDER — CEFAZOLIN SODIUM-DEXTROSE 2-4 GM/100ML-% IV SOLN
2.0000 g | INTRAVENOUS | Status: AC
  Administered 2021-06-30: 2 g via INTRAVENOUS

## 2021-06-30 MED ORDER — FENTANYL CITRATE (PF) 100 MCG/2ML IJ SOLN
INTRAMUSCULAR | Status: AC
  Filled 2021-06-30: qty 2

## 2021-06-30 MED ORDER — TIZANIDINE HCL 4 MG PO TABS: 4.0000 mg | ORAL_TABLET | Freq: Four times a day (QID) | ORAL | Status: DC | PRN

## 2021-06-30 MED ORDER — KETOROLAC TROMETHAMINE 15 MG/ML IJ SOLN
15.0000 mg | Freq: Four times a day (QID) | INTRAMUSCULAR | Status: DC
  Administered 2021-06-30 – 2021-07-01 (×3): 15 mg via INTRAVENOUS
  Filled 2021-06-30 (×3): qty 1

## 2021-06-30 MED ORDER — ACETAMINOPHEN 500 MG PO TABS
1000.0000 mg | ORAL_TABLET | Freq: Once | ORAL | Status: AC
  Administered 2021-06-30: 1000 mg via ORAL

## 2021-06-30 MED ORDER — SODIUM CHLORIDE 0.9 % IV SOLN: INTRAVENOUS | Status: DC

## 2021-06-30 MED ORDER — OXYCODONE HCL 5 MG PO TABS
5.0000 mg | ORAL_TABLET | Freq: Once | ORAL | Status: AC | PRN
  Administered 2021-06-30: 5 mg via ORAL

## 2021-06-30 MED ORDER — METOCLOPRAMIDE HCL 5 MG/ML IJ SOLN: 5.0000 mg | Freq: Three times a day (TID) | INTRAMUSCULAR | Status: DC | PRN

## 2021-06-30 MED ORDER — ONDANSETRON HCL 4 MG PO TABS: 4.0000 mg | ORAL_TABLET | Freq: Four times a day (QID) | ORAL | Status: DC | PRN

## 2021-06-30 MED ORDER — OXYCODONE HCL 5 MG PO TABS
ORAL_TABLET | ORAL | Status: AC
  Filled 2021-06-30: qty 1

## 2021-06-30 MED ORDER — OXYCODONE HCL 5 MG PO TABS
5.0000 mg | ORAL_TABLET | ORAL | Status: DC | PRN
  Administered 2021-06-30 – 2021-07-01 (×3): 10 mg via ORAL
  Filled 2021-06-30 (×2): qty 2

## 2021-06-30 MED ORDER — DOCUSATE SODIUM 100 MG PO CAPS
100.0000 mg | ORAL_CAPSULE | Freq: Two times a day (BID) | ORAL | Status: DC
  Administered 2021-06-30 – 2021-07-01 (×2): 100 mg via ORAL
  Filled 2021-06-30 (×3): qty 1

## 2021-06-30 MED ORDER — OXYCODONE HCL 5 MG PO TABS
ORAL_TABLET | ORAL | Status: AC
Start: 2021-06-30 — End: 2021-07-01
  Filled 2021-06-30: qty 2

## 2021-06-30 MED ORDER — ONDANSETRON HCL 4 MG/2ML IJ SOLN
INTRAMUSCULAR | Status: DC | PRN
  Administered 2021-06-30: 4 mg via INTRAVENOUS

## 2021-06-30 MED ORDER — CEFAZOLIN SODIUM-DEXTROSE 2-4 GM/100ML-% IV SOLN
2.0000 g | Freq: Three times a day (TID) | INTRAVENOUS | Status: AC
  Administered 2021-06-30 – 2021-07-01 (×3): 2 g via INTRAVENOUS
  Filled 2021-06-30 (×3): qty 100

## 2021-06-30 MED ORDER — VANCOMYCIN HCL 1000 MG IV SOLR
INTRAVENOUS | Status: AC
  Filled 2021-06-30: qty 20

## 2021-06-30 MED ORDER — CHLORHEXIDINE GLUCONATE 0.12 % MT SOLN
15.0000 mL | Freq: Once | OROMUCOSAL | Status: AC
  Administered 2021-06-30: 15 mL via OROMUCOSAL

## 2021-06-30 MED ORDER — 0.9 % SODIUM CHLORIDE (POUR BTL) OPTIME
TOPICAL | Status: DC | PRN
  Administered 2021-06-30: 1000 mL

## 2021-06-30 MED ORDER — ASPIRIN 325 MG PO TABS
325.0000 mg | ORAL_TABLET | Freq: Two times a day (BID) | ORAL | Status: DC
  Administered 2021-06-30 – 2021-07-01 (×2): 325 mg via ORAL
  Filled 2021-06-30 (×3): qty 1

## 2021-06-30 MED ORDER — PROPOFOL 500 MG/50ML IV EMUL
INTRAVENOUS | Status: DC | PRN
  Administered 2021-06-30: 50 ug/kg/min via INTRAVENOUS

## 2021-06-30 MED ORDER — POLYETHYLENE GLYCOL 3350 17 G PO PACK: 17.0000 g | PACK | Freq: Every day | ORAL | Status: DC | PRN

## 2021-06-30 MED ORDER — LACTATED RINGERS IV SOLN: INTRAVENOUS | Status: DC | PRN

## 2021-06-30 MED ORDER — LACTATED RINGERS IV SOLN: INTRAVENOUS | Status: DC

## 2021-06-30 MED ORDER — PROPOFOL 10 MG/ML IV BOLUS
INTRAVENOUS | Status: DC | PRN
  Administered 2021-06-30: 200 mg via INTRAVENOUS

## 2021-06-30 MED ORDER — MONTELUKAST SODIUM 10 MG PO TABS
10.0000 mg | ORAL_TABLET | Freq: Every day | ORAL | Status: DC
  Administered 2021-06-30 – 2021-07-01 (×2): 10 mg via ORAL
  Filled 2021-06-30 (×2): qty 1

## 2021-06-30 MED ORDER — ACETAMINOPHEN 500 MG PO TABS
1000.0000 mg | ORAL_TABLET | Freq: Four times a day (QID) | ORAL | Status: DC
  Administered 2021-06-30 – 2021-07-01 (×3): 1000 mg via ORAL
  Filled 2021-06-30 (×3): qty 2

## 2021-06-30 MED ORDER — FENTANYL CITRATE (PF) 250 MCG/5ML IJ SOLN
INTRAMUSCULAR | Status: AC
  Filled 2021-06-30: qty 5

## 2021-06-30 MED ORDER — FENTANYL CITRATE (PF) 100 MCG/2ML IJ SOLN
25.0000 ug | INTRAMUSCULAR | Status: DC | PRN
  Administered 2021-06-30 (×3): 50 ug via INTRAVENOUS

## 2021-06-30 MED ORDER — ROCURONIUM BROMIDE 10 MG/ML (PF) SYRINGE
PREFILLED_SYRINGE | INTRAVENOUS | Status: DC | PRN
  Administered 2021-06-30: 50 mg via INTRAVENOUS
  Administered 2021-06-30: 30 mg via INTRAVENOUS

## 2021-06-30 MED ORDER — PROPOFOL 10 MG/ML IV BOLUS
INTRAVENOUS | Status: AC
  Filled 2021-06-30: qty 20

## 2021-06-30 MED ORDER — MIDAZOLAM HCL 2 MG/2ML IJ SOLN
INTRAMUSCULAR | Status: AC
  Filled 2021-06-30: qty 2

## 2021-06-30 MED ORDER — METOCLOPRAMIDE HCL 5 MG PO TABS: 5.0000 mg | ORAL_TABLET | Freq: Three times a day (TID) | ORAL | Status: DC | PRN

## 2021-06-30 MED ORDER — DEXAMETHASONE SODIUM PHOSPHATE 10 MG/ML IJ SOLN
INTRAMUSCULAR | Status: DC | PRN
  Administered 2021-06-30: 10 mg via INTRAVENOUS

## 2021-06-30 MED ORDER — VANCOMYCIN HCL 1000 MG IV SOLR
INTRAVENOUS | Status: DC | PRN
  Administered 2021-06-30: 1000 mg

## 2021-06-30 MED ORDER — OXYCODONE HCL 5 MG/5ML PO SOLN: 5.0000 mg | Freq: Once | ORAL | Status: AC | PRN

## 2021-06-30 MED ORDER — HYDROMORPHONE HCL 1 MG/ML IJ SOLN
0.5000 mg | INTRAMUSCULAR | Status: DC | PRN
  Administered 2021-06-30 (×2): 1 mg via INTRAVENOUS
  Filled 2021-06-30 (×2): qty 1

## 2021-06-30 SURGICAL SUPPLY — 56 items
BAG COUNTER SPONGE SURGICOUNT (BAG) ×2 IMPLANT
BIT DRILL LONG 4.2 (BIT) ×1 IMPLANT
BIT DRILL SHORT 4.2 (BIT) IMPLANT
BNDG COHESIVE 4X5 TAN STRL (GAUZE/BANDAGES/DRESSINGS) ×2 IMPLANT
BNDG ELASTIC 4X5.8 VLCR STR LF (GAUZE/BANDAGES/DRESSINGS) ×1 IMPLANT
BNDG ELASTIC 6X5.8 VLCR STR LF (GAUZE/BANDAGES/DRESSINGS) ×1 IMPLANT
BRUSH SCRUB EZ PLAIN DRY (MISCELLANEOUS) ×4 IMPLANT
CHLORAPREP W/TINT 26 (MISCELLANEOUS) ×3 IMPLANT
COVER MAYO STAND STRL (DRAPES) ×2 IMPLANT
COVER SURGICAL LIGHT HANDLE (MISCELLANEOUS) ×4 IMPLANT
DERMABOND ADVANCED (GAUZE/BANDAGES/DRESSINGS) ×1
DERMABOND ADVANCED .7 DNX12 (GAUZE/BANDAGES/DRESSINGS) ×1 IMPLANT
DRAPE C-ARM 42X72 X-RAY (DRAPES) ×2 IMPLANT
DRAPE C-ARMOR (DRAPES) ×2 IMPLANT
DRAPE HALF SHEET 40X57 (DRAPES) ×4 IMPLANT
DRAPE IMP U-DRAPE 54X76 (DRAPES) ×4 IMPLANT
DRAPE INCISE IOBAN 66X45 STRL (DRAPES) ×2 IMPLANT
DRAPE ORTHO SPLIT 77X108 STRL (DRAPES) ×2
DRAPE SURG 17X23 STRL (DRAPES) ×2 IMPLANT
DRAPE SURG ORHT 6 SPLT 77X108 (DRAPES) ×2 IMPLANT
DRAPE U-SHAPE 47X51 STRL (DRAPES) ×2 IMPLANT
DRESSING MEPILEX FLEX 4X4 (GAUZE/BANDAGES/DRESSINGS) IMPLANT
DRILL BIT SHORT 4.2 (BIT) ×2
DRSG MEPILEX FLEX 4X4 (GAUZE/BANDAGES/DRESSINGS) ×6
ELECT REM PT RETURN 9FT ADLT (ELECTROSURGICAL) ×2
ELECTRODE REM PT RTRN 9FT ADLT (ELECTROSURGICAL) ×1 IMPLANT
GAUZE SPONGE 4X4 12PLY STRL (GAUZE/BANDAGES/DRESSINGS) ×1 IMPLANT
GLOVE BIO SURGEON STRL SZ 6.5 (GLOVE) ×6 IMPLANT
GLOVE BIO SURGEON STRL SZ7.5 (GLOVE) ×8 IMPLANT
GLOVE BIOGEL PI IND STRL 6.5 (GLOVE) ×1 IMPLANT
GLOVE BIOGEL PI IND STRL 7.5 (GLOVE) ×1 IMPLANT
GLOVE BIOGEL PI INDICATOR 6.5 (GLOVE) ×1
GLOVE BIOGEL PI INDICATOR 7.5 (GLOVE) ×1
GOWN STRL REUS W/ TWL LRG LVL3 (GOWN DISPOSABLE) ×2 IMPLANT
GOWN STRL REUS W/TWL LRG LVL3 (GOWN DISPOSABLE) ×2
KIT BASIN OR (CUSTOM PROCEDURE TRAY) ×2 IMPLANT
KIT TURNOVER KIT B (KITS) ×2 IMPLANT
NAIL IM FEM RNFA 12X380 5D (Nail) ×1 IMPLANT
PACK ORTHO EXTREMITY (CUSTOM PROCEDURE TRAY) ×2 IMPLANT
PAD ARMBOARD 7.5X6 YLW CONV (MISCELLANEOUS) ×4 IMPLANT
REAMER ROD 3.8 BALL TIP 3X950 (ORTHOPEDIC DISPOSABLE SUPPLIES) ×1 IMPLANT
SCREW LOCK IM 5X36 (Screw) ×2 IMPLANT
SCREW LOCK IM NAIL 5X50 (Screw) ×1 IMPLANT
SCREW LOCK IM NAIL 5X70 (Screw) ×1 IMPLANT
SCREW LOCK X25 36X5X IM (Screw) IMPLANT
SPONGE T-LAP 18X18 ~~LOC~~+RFID (SPONGE) ×2 IMPLANT
STAPLER VISISTAT 35W (STAPLE) ×1 IMPLANT
SUT MNCRL AB 3-0 PS2 27 (SUTURE) ×2 IMPLANT
SUT VIC AB 0 CT1 27 (SUTURE) ×1
SUT VIC AB 0 CT1 27XBRD ANBCTR (SUTURE) IMPLANT
SUT VIC AB 2-0 CT1 27 (SUTURE) ×1
SUT VIC AB 2-0 CT1 TAPERPNT 27 (SUTURE) IMPLANT
TOWEL GREEN STERILE (TOWEL DISPOSABLE) ×4 IMPLANT
TOWEL GREEN STERILE FF (TOWEL DISPOSABLE) ×2 IMPLANT
TUBE CONNECTING 12X1/4 (SUCTIONS) ×2 IMPLANT
YANKAUER SUCT BULB TIP NO VENT (SUCTIONS) ×2 IMPLANT

## 2021-06-30 NOTE — Progress Notes (Signed)
Pt on hold waiting for room assignment on 5N or 6N-- pt being placed in [facility] and bringing her sister to the bedside. Continuing to monitor and assessing pt as needed. Pt in NAD. ?

## 2021-06-30 NOTE — Plan of Care (Signed)
Patient admitted to room 5N31. Alert and oriented x4. Complaints of pain, but primary nurse is aware. Educated on unit process.  ?

## 2021-06-30 NOTE — Interval H&P Note (Signed)
History and Physical Interval Note: ? ?06/30/2021 ?8:23 AM ? ?Kristin Lopez  has presented today for surgery, with the diagnosis of Right femur nonunion.  The various methods of treatment have been discussed with the patient and family. After consideration of risks, benefits and other options for treatment, the patient has consented to  Procedure(s): ?EXCHANGE NAILING FEMUR NONUNION (Right) as a surgical intervention.  The patient's history has been reviewed, patient examined, no change in status, stable for surgery.  I have reviewed the patient's chart and labs.  Questions were answered to the patient's satisfaction.   ? ? ?Lennette Bihari P Scotty Pinder ? ? ?

## 2021-06-30 NOTE — Transfer of Care (Signed)
Immediate Anesthesia Transfer of Care Note ? ?Patient: Kristin Lopez ? ?Procedure(s) Performed: EXCHANGE NAILING FEMUR NONUNION (Right) ? ?Patient Location: PACU ? ?Anesthesia Type:General ? ?Level of Consciousness: awake, alert  and oriented ? ?Airway & Oxygen Therapy: Patient Spontanous Breathing and Patient connected to nasal cannula oxygen ? ?Post-op Assessment: Report given to RN and Post -op Vital signs reviewed and stable ? ?Post vital signs: Reviewed and stable ? ?Last Vitals:  ?Vitals Value Taken Time  ?BP 127/85 06/30/21 1017  ?Temp 36.4 ?C 06/30/21 1017  ?Pulse 67 06/30/21 1023  ?Resp 16 06/30/21 1023  ?SpO2 95 % 06/30/21 1023  ?Vitals shown include unvalidated device data. ? ?Last Pain:  ?Vitals:  ? 06/30/21 1017  ?TempSrc:   ?PainSc: 10-Worst pain ever  ?   ? ?Patients Stated Pain Goal: 0 (06/30/21 0725) ? ?Complications: No notable events documented. ?
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
## 2021-06-30 NOTE — Anesthesia Postprocedure Evaluation (Signed)
Anesthesia Post Note ? ?Patient: Kristin Lopez ? ?Procedure(s) Performed: EXCHANGE NAILING FEMUR NONUNION (Right) ? ?  ? ?Patient location during evaluation: PACU ?Anesthesia Type: General ?Level of consciousness: awake and alert ?Pain management: pain level controlled ?Vital Signs Assessment: post-procedure vital signs reviewed and stable ?Respiratory status: spontaneous breathing, nonlabored ventilation and respiratory function stable ?Cardiovascular status: stable and blood pressure returned to baseline ?Anesthetic complications: no ? ? ?No notable events documented. ? ?Last Vitals:  ?Vitals:  ? 06/30/21 1302 06/30/21 1500  ?BP: 126/79 133/77  ?Pulse: 78 93  ?Resp: 15 15  ?Temp:  36.6 ?C  ?SpO2: 96% 99%  ?  ?Last Pain:  ?Vitals:  ? 06/30/21 1554  ?TempSrc:   ?PainSc: 3   ? ? ?  ?  ?  ?  ?  ?  ? ?Beryle Lathe ? ? ? ? ?

## 2021-06-30 NOTE — Anesthesia Procedure Notes (Signed)
Procedure Name: Intubation ?Date/Time: 06/30/2021 8:35 AM ?Performed by: Minerva Ends, CRNA ?Pre-anesthesia Checklist: Patient identified, Emergency Drugs available, Suction available and Patient being monitored ?Patient Re-evaluated:Patient Re-evaluated prior to induction ?Oxygen Delivery Method: Circle system utilized ?Preoxygenation: Pre-oxygenation with 100% oxygen ?Induction Type: IV induction ?Ventilation: Oral airway inserted - appropriate to patient size and Mask ventilation with difficulty ?Laryngoscope Size: Mac and 3 ?Grade View: Grade I ?Tube type: Oral ?Tube size: 7.0 mm ?Number of attempts: 1 ?Airway Equipment and Method: Stylet and Oral airway ?Placement Confirmation: ETT inserted through vocal cords under direct vision, positive ETCO2 and breath sounds checked- equal and bilateral ?Secured at: 23 cm ?Tube secured with: Tape ?Dental Injury: Teeth and Oropharynx as per pre-operative assessment  ? ? ? ? ?

## 2021-07-01 ENCOUNTER — Encounter (HOSPITAL_COMMUNITY): Payer: Self-pay | Admitting: Student

## 2021-07-01 ENCOUNTER — Other Ambulatory Visit (HOSPITAL_COMMUNITY): Payer: Self-pay

## 2021-07-01 DIAGNOSIS — S72301K Unspecified fracture of shaft of right femur, subsequent encounter for closed fracture with nonunion: Secondary | ICD-10-CM | POA: Diagnosis not present

## 2021-07-01 LAB — CBC
HCT: 39.5 % (ref 36.0–46.0)
Hemoglobin: 12.5 g/dL (ref 12.0–15.0)
MCH: 26.7 pg (ref 26.0–34.0)
MCHC: 31.6 g/dL (ref 30.0–36.0)
MCV: 84.4 fL (ref 80.0–100.0)
Platelets: 212 10*3/uL (ref 150–400)
RBC: 4.68 MIL/uL (ref 3.87–5.11)
RDW: 14.9 % (ref 11.5–15.5)
WBC: 9.1 10*3/uL (ref 4.0–10.5)
nRBC: 0 % (ref 0.0–0.2)

## 2021-07-01 LAB — BASIC METABOLIC PANEL WITH GFR
Anion gap: 9 (ref 5–15)
BUN: 16 mg/dL (ref 6–20)
CO2: 23 mmol/L (ref 22–32)
Calcium: 9.1 mg/dL (ref 8.9–10.3)
Chloride: 105 mmol/L (ref 98–111)
Creatinine, Ser: 0.88 mg/dL (ref 0.44–1.00)
GFR, Estimated: >60 mL/min
Glucose, Bld: 114 mg/dL — ABNORMAL HIGH (ref 70–99)
Potassium: 3.8 mmol/L (ref 3.5–5.1)
Sodium: 137 mmol/L (ref 135–145)

## 2021-07-01 MED ORDER — ACETAMINOPHEN 500 MG PO TABS: 500.0000 mg | ORAL_TABLET | Freq: Four times a day (QID) | ORAL | 0 refills | Status: AC | PRN

## 2021-07-01 MED ORDER — ASPIRIN 325 MG PO TABS
325.0000 mg | ORAL_TABLET | Freq: Two times a day (BID) | ORAL | 0 refills | Status: AC
  Filled 2021-07-01: qty 60, 30d supply, fill #0

## 2021-07-01 MED ORDER — TIZANIDINE HCL 4 MG PO TABS
4.0000 mg | ORAL_TABLET | Freq: Four times a day (QID) | ORAL | 0 refills | Status: AC | PRN
  Filled 2021-07-01: qty 40, 10d supply, fill #0

## 2021-07-01 MED ORDER — OXYCODONE-ACETAMINOPHEN 5-325 MG PO TABS
1.0000 | ORAL_TABLET | ORAL | 0 refills | Status: AC | PRN
  Filled 2021-07-01: qty 60, 5d supply, fill #0

## 2021-07-01 NOTE — TOC Transition Note (Addendum)
Transition of Care (TOC) - CM/SW Discharge Note ? ? ?Patient Details  ?Name: Kristin Lopez ?MRN: [id number] ?Date of Birth: May 08, 1965 ? ?Transition of Care [facility]) CM/SW Contact:  ?Epifanio Lesches, RN ?Phone Number: ?07/01/2021, 10:24 AM ? ? ?Clinical Narrative:    ?Patient will DC to: home ?Anticipated DC date: 07/01/2021 ?Family notified: yes ?Transport by: car ? ?   - Admitted with R femur nonunion. S/p R removal R femur hardware with repair 5/3. ? ?Per MD patient ready for DC today. RN, patient, and patient's family aware of DC. Referral for outpt PT and OT with [facility] made per NCM. Pt states PTA was active with them. ?Pt with post hospital f/u noted on AVS. Pt without Rx made concerns. TOC pharmacy  will delivery Rx med to bedside prior to d/c. Sister to provide transportation to home. ? ?RNCM will sign off for now as intervention is no longer needed. Please consult [place] again if new needs arise.  ? ? ?Final next level of care: Home/Self Care ?Barriers to Discharge: No Barriers Identified ? ? ?Patient Goals and CMS Choice ?  ?  ?  ? ?Discharge Placement ?  ?           ?  ?  ?  ?  ? ?Discharge Plan and Services ?  ?  ?           ?  ?  ?  ?  ?  ?  ?  ?  ?  ?  ? ?Social Determinants of Health (SDOH) Interventions ?  ? ? ?Readmission Risk Interventions ?   ? View : No data to display.  ?  ?  ?  ? ? ? ? ? ?
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
## 2021-07-01 NOTE — Discharge Summary (Signed)
? ?Orthopaedic Trauma Service (OTS) Discharge Summary  ? ?Patient ID: ?Kristin Lopez ?MRN: [id number] ?DOB/AGE: [birth year] 56 y.o. ? ?Admit date: 06/30/2021 ?Discharge date: 07/01/2021 ? ?Admission Diagnoses: Right femur nonunion ? ?Discharge Diagnoses:  ?Principal Problem: ?  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 ? ?Past Medical History:  ?Diagnosis Date  ? Hyperlipidemia   ? ? ? ?Procedures Performed:  ?CPT 27470-Repair of right femoral nonunion ?CPT 20680-Removal of hardware right femur ? ?Discharged Condition: good ? ?Hospital Course: Patient presented to [facility] on 06/30/2021 for scheduled procedure on right lower extremity.  Same to the operating room by Dr. Jena Gauss and underwent the above procedure, tolerated this well without complications.  Was admitted overnight for observation and pain control.  Patient began working with physical and occupational therapy on the afternoon of surgery.  She did well with this.  Was started on aspirin for DVT prophylaxis starting on postoperative day #1.  Was able to mobilize through the hallways and up and down stairs with therapies. ?On 07/01/2021, the patient was tolerating diet, working well with therapies, pain well controlled, vital signs stable, dressings clean, dry, intact and felt stable for discharge to home. Patient will follow up as below and knows to call with questions or concerns.  ?  ? ?Consults: None ? ?Significant Diagnostic Studies:  ? ?Results for orders placed or performed during the hospital encounter of 06/30/21 (from the past 168 hour(s))  ?CBC per protocol  ? Collection Time: 06/30/21  7:41 AM  ?Result Value Ref Range  ? WBC 5.3 4.0 - 10.5 K/uL  ? RBC 4.68 3.87 - 5.11 MIL/uL  ? Hemoglobin 12.7 12.0 - 15.0 g/dL  ? HCT 39.9 36.0 - 46.0 %  ? MCV 85.3 80.0 - 100.0 fL  ? MCH 27.1 26.0 - 34.0 pg  ? MCHC 31.8 30.0 - 36.0 g/dL  ? RDW 14.8 11.5 - 15.5 %  ? Platelets 209 150 - 400 K/uL  ? nRBC 0.0 0.0 - 0.2 %  ?CBC   ? Collection Time: 07/01/21  2:04 AM  ?Result Value Ref Range  ? WBC 9.1 4.0 - 10.5 K/uL  ? RBC 4.68 3.87 - 5.11 MIL/uL  ? Hemoglobin 12.5 12.0 - 15.0 g/dL  ? HCT 39.5 36.0 - 46.0 %  ? MCV 84.4 80.0 - 100.0 fL  ? MCH 26.7 26.0 - 34.0 pg  ? MCHC 31.6 30.0 - 36.0 g/dL  ? RDW 14.9 11.5 - 15.5 %  ? Platelets 212 150 - 400 K/uL  ? nRBC 0.0 0.0 - 0.2 %  ?Basic metabolic panel  ? Collection Time: 07/01/21  2:04 AM  ?Result Value Ref Range  ? Sodium 137 135 - 145 mmol/L  ? Potassium 3.8 3.5 - 5.1 mmol/L  ? Chloride 105 98 - 111 mmol/L  ? CO2 23 22 - 32 mmol/L  ? Glucose, Bld 114 (H) 70 - 99 mg/dL  ? BUN 16 6 - 20 mg/dL  ? Creatinine, Ser 0.88 0.44 - 1.00 mg/dL  ? Calcium 9.1 8.9 - 10.3 mg/dL  ? GFR, Estimated >60 >60 mL/min  ? Anion gap 9 5 - 15  ? ? ? ?Treatments: IV hydration, antibiotics: Ancef, analgesia: acetaminophen, Toradol, Dilaudid, and oxycodone, anticoagulation: ASA, therapies: PT and OT, and surgery: As above ? ?Discharge Exam: ?General: Patient sitting up in bedside chair, no acute distress ?Respiratory: No increased work of breathing at rest ?Right lower extremity: Proximal thigh dressing with minimal strikethrough.  Otherwise all other dressings clean, dry, intact.  No significant tenderness with palpation throughout the thigh.  Able to flex and extend the knee without significant discomfort.  Ankle DF/PF intact. + EHL/FHL.  Compartments soft and compressible.  Endorses sensation throughout extremity.  Neurovascularly intact ? ?Disposition: Discharge disposition: 01-Home or Self Care ? ? ? ? ? ? ? ?Allergies as of 07/01/2021   ? ?   Reactions  ? Penicillins Hives  ? Sulfate Hives  ? ?  ? ?  ?Medication List  ?  ? ?TAKE these medications   ? ?acetaminophen 500 MG tablet ?Commonly known as: TYLENOL ?Take 1 tablet (500 mg total) by mouth every 6 (six) hours as needed for mild pain, fever or headache. ?  ?aspirin 325 MG tablet ?Take 1 tablet (325 mg total) by mouth 2 (two) times daily. ?  ?fluticasone 50 MCG/ACT  nasal spray ?Commonly known as: FLONASE ?Place 2 sprays into both nostrils daily. ?  ?montelukast 10 MG tablet ?Commonly known as: SINGULAIR ?Take 10 mg by mouth daily. ?  ?multivitamin with minerals Tabs tablet ?Take 1 tablet by mouth daily. ?  ?oxyCODONE-acetaminophen 5-325 MG tablet ?Commonly known as: Percocet ?Take 1-2 tablets by mouth every 4 (four) hours as needed for severe pain or moderate pain. ?  ?tiZANidine 4 MG tablet ?Commonly known as: ZANAFLEX ?Take 1 tablet (4 mg total) by mouth every 6 (six) hours as needed for muscle spasms. ?  ?VITAMIN D PO ?Take 2 capsules by mouth daily. ?  ? ?  ? ? Follow-up Information   ? ? Haddix, Gillie Manners, MD. Schedule an appointment as soon as possible for a visit today.   ?Specialty: Orthopedic Surgery ?Why: 2-3 weeks for wound check and repeat x-rays ?Contact information: ?[address] ?[place] [address] ?[phone] ? ? ?  ?  ? ?  ?  ? ?  ? ? ?Discharge Instructions and Plan: ?Patient will be discharged to home. Will be discharged on Aspirin 325 mg twice daily x30 days for DVT prophylaxis. Patient already has all the necessary DME for discharge. Patient will follow up with Dr. Jena Gauss in 2 weeks for repeat x-rays and wound check. ? ? ?Signed: ? ?Thompson Caul, PA-C ??[phone]? (phone) ?07/01/2021, 9:38 AM ? ?[facility] ?[address] ?[place] [address] ?[phone] [place]) ?[phone] (F) ? ? ? ?

## 2021-07-01 NOTE — Progress Notes (Signed)
OT Cancellation Note ? ?Patient Details ?Name: KALIEGH ARMENT ?MRN: [id number] ?DOB: 1965/10/09 ? ? ?Cancelled Treatment:    Reason Eval/Treat Not Completed: OT screened, no needs identified, will sign off Pt with hx of recent similar surgeries, moving around well per PT. Able to complete many ADLs, but has family assist as needed. No formal OT eval needed at this time. OT to sign off. ? ?Layla Maw ?07/01/2021, 9:45 AM ?

## 2021-07-01 NOTE — Discharge Instructions (Signed)
? ?Orthopaedic Trauma Service Discharge Instructions ? ? ?General Discharge Instructions ? ?WEIGHT BEARING STATUS:weightbearing as tolerated ? ?RANGE OF MOTION/ACTIVITY: ok for hip and knee motion as tolerated ? ?Wound Care:you may remove your surgical dressings on post-op day #2 (Friday 07/02/21). Incisions can be left open to air if there is no drainage. If incision continues to have drainage, follow wound care instructions below. Okay to shower if no drainage from incisions. ? ?DVT/PE prophylaxis: Aspirin 325 mg twice daily x 30 days ? ?Diet: as you were eating previously.  Can use over the counter stool softeners and bowel preparations, such as Miralax, to help with bowel movements.  Narcotics can be constipating.  Be sure to drink plenty of fluids ? ?PAIN MEDICATION USE AND EXPECTATIONS ? You have likely been given narcotic medications to help control your pain.  After a traumatic event that results in an fracture (broken bone) with or without surgery, it is ok to use narcotic pain medications to help control one's pain.  We understand that everyone responds to pain differently and each individual patient will be evaluated on a regular basis for the continued need for narcotic medications. Ideally, narcotic medication use should last no more than 6-8 weeks (coinciding with fracture healing).  ? As a patient it is your responsibility as well to monitor narcotic medication use and report the amount and frequency you use these medications when you come to your office visit.  ? We would also advise that if you are using narcotic medications, you should take a dose prior to therapy to maximize you participation. ? ?IF YOU ARE ON NARCOTIC MEDICATIONS IT IS NOT PERMISSIBLE TO OPERATE A MOTOR VEHICLE (MOTORCYCLE/CAR/TRUCK/MOPED) OR HEAVY MACHINERY ?DO NOT MIX NARCOTICS WITH OTHER CNS (CENTRAL NERVOUS SYSTEM) DEPRESSANTS SUCH AS ALCOHOL ? ? ?STOP SMOKING OR USING NICOTINE PRODUCTS!!!! ? As discussed nicotine severely  impairs your body's ability to heal surgical and traumatic wounds but also impairs bone healing.  Wounds and bone heal by forming microscopic blood vessels (angiogenesis) and nicotine is a vasoconstrictor (essentially, shrinks blood vessels).  Therefore, if vasoconstriction occurs to these microscopic blood vessels they essentially disappear and are unable to deliver necessary nutrients to the healing tissue.  This is one modifiable factor that you can do to dramatically increase your chances of healing your injury.   ? (This means no smoking, no nicotine gum, patches, etc) ? ?DO NOT USE NONSTEROIDAL ANTI-INFLAMMATORY DRUGS (NSAID'S) ? Using products such as Advil (ibuprofen), Aleve (naproxen), Motrin (ibuprofen) for additional pain control during fracture healing can delay and/or prevent the healing response.  If you would like to take over the counter (OTC) medication, Tylenol (acetaminophen) is ok.  However, some narcotic medications that are given for pain control contain acetaminophen as well. Therefore, you should not exceed more than 4000 mg of tylenol in a day if you do not have liver disease.  Also note that there are may OTC medicines, such as cold medicines and allergy medicines that my contain tylenol as well.  If you have any questions about medications and/or interactions please ask your doctor/PA or your pharmacist.  ?   ? ?ICE AND ELEVATE INJURED/OPERATIVE EXTREMITY ? Using ice and elevating the injured extremity above your heart can help with swelling and pain control.  Icing in a pulsatile fashion, such as 20 minutes on and 20 minutes off, can be followed.   ? Do not place ice directly on skin. Make sure there is a barrier between to skin and  the ice pack.   ? Using frozen items such as frozen peas works well as the conform nicely to the are that needs to be iced. ? ?USE AN ACE WRAP OR TED HOSE FOR SWELLING CONTROL ? In addition to icing and elevation, Ace wraps or TED hose are used to help limit  and resolve swelling.  It is recommended to use Ace wraps or TED hose until you are informed to stop.   ? When using Ace Wraps start the wrapping distally (farthest away from the body) and wrap proximally (closer to the body) ?  Example: If you had surgery on your leg or thing and you do not have a splint on, start the ace wrap at the toes and work your way up to the thigh ?       If you had surgery on your upper extremity and do not have a splint on, start the ace wrap at your fingers and work your way up to the upper arm ? ?CALL THE OFFICE WITH ANY QUESTIONS OR CONCERNS: [phone]  ? ?VISIT OUR WEBSITE FOR ADDITIONAL INFORMATION: [email or web address] ?  ? ? ?Discharge Wound Care Instructions ? ?Do NOT apply any ointments, solutions or lotions to pin sites or surgical wounds.  These prevent needed drainage and even though solutions like hydrogen peroxide kill bacteria, they also damage cells lining the pin sites that help fight infection.  Applying lotions or ointments can keep the wounds moist and can cause them to breakdown and open up as well. This can increase the risk for infection. When in doubt call the office. ? ?If any drainage is noted, use a foam dressing. ?- These dressing supplies should be available at local medical supply stores [facility], [facility], etc) as well as Insurance claims handler ([facility], [facility], [employer], etc) ? ?Once the incision is completely dry and without drainage, it may be left open to air out.  Showering may begin 36-48 hours later.  Cleaning gently with soap and water. ?

## 2021-07-19 ENCOUNTER — Ambulatory Visit: Payer: Medicaid Other | Admitting: Physical Therapy

## 2021-07-21 ENCOUNTER — Ambulatory Visit: Payer: Medicaid Other | Admitting: Physical Therapy

## 2021-07-21 ENCOUNTER — Encounter: Payer: Self-pay | Admitting: Physical Therapy

## 2021-07-21 DIAGNOSIS — R2689 Other abnormalities of gait and mobility: Secondary | ICD-10-CM

## 2021-07-21 DIAGNOSIS — R2681 Unsteadiness on feet: Secondary | ICD-10-CM

## 2021-07-21 DIAGNOSIS — M79651 Pain in right thigh: Secondary | ICD-10-CM

## 2021-07-21 DIAGNOSIS — R262 Difficulty in walking, not elsewhere classified: Secondary | ICD-10-CM

## 2021-07-21 DIAGNOSIS — Z9181 History of falling: Secondary | ICD-10-CM

## 2021-07-21 DIAGNOSIS — M79604 Pain in right leg: Secondary | ICD-10-CM | POA: Diagnosis present

## 2021-07-21 DIAGNOSIS — M6281 Muscle weakness (generalized): Secondary | ICD-10-CM | POA: Diagnosis present
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
## 2021-07-28 ENCOUNTER — Ambulatory Visit: Payer: Medicaid Other | Admitting: Physical Therapy

## 2021-07-28 ENCOUNTER — Encounter: Payer: Self-pay | Admitting: Physical Therapy

## 2021-07-28 DIAGNOSIS — R262 Difficulty in walking, not elsewhere classified: Secondary | ICD-10-CM

## 2021-07-28 DIAGNOSIS — R2681 Unsteadiness on feet: Secondary | ICD-10-CM

## 2021-07-28 DIAGNOSIS — R2689 Other abnormalities of gait and mobility: Secondary | ICD-10-CM

## 2021-07-28 DIAGNOSIS — Z9181 History of falling: Secondary | ICD-10-CM

## 2021-07-28 DIAGNOSIS — M6281 Muscle weakness (generalized): Secondary | ICD-10-CM | POA: Diagnosis not present

## 2021-07-28 DIAGNOSIS — M79604 Pain in right leg: Secondary | ICD-10-CM

## 2021-07-28 DIAGNOSIS — M79651 Pain in right thigh: Secondary | ICD-10-CM
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
## 2021-08-02 ENCOUNTER — Ambulatory Visit: Payer: Medicaid Other | Attending: Student | Admitting: Physical Therapy

## 2021-08-02 ENCOUNTER — Encounter: Payer: Self-pay | Admitting: Physical Therapy

## 2021-08-02 DIAGNOSIS — M6281 Muscle weakness (generalized): Secondary | ICD-10-CM | POA: Diagnosis present

## 2021-08-02 DIAGNOSIS — R262 Difficulty in walking, not elsewhere classified: Secondary | ICD-10-CM | POA: Insufficient documentation

## 2021-08-02 DIAGNOSIS — Z9181 History of falling: Secondary | ICD-10-CM | POA: Diagnosis present

## 2021-08-02 DIAGNOSIS — M79604 Pain in right leg: Secondary | ICD-10-CM | POA: Insufficient documentation

## 2021-08-02 DIAGNOSIS — M79651 Pain in right thigh: Secondary | ICD-10-CM | POA: Insufficient documentation

## 2021-08-02 DIAGNOSIS — R2681 Unsteadiness on feet: Secondary | ICD-10-CM | POA: Insufficient documentation

## 2021-08-02 DIAGNOSIS — R2689 Other abnormalities of gait and mobility: Secondary | ICD-10-CM | POA: Diagnosis present
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
## 2021-08-04 ENCOUNTER — Encounter: Payer: Medicaid Other | Admitting: Physical Therapy

## 2021-08-05 ENCOUNTER — Ambulatory Visit: Payer: Medicaid Other | Admitting: Physical Therapy

## 2021-08-09 ENCOUNTER — Encounter: Payer: Self-pay | Admitting: Physical Therapy

## 2021-08-09 ENCOUNTER — Ambulatory Visit: Payer: Medicaid Other | Admitting: Physical Therapy

## 2021-08-09 DIAGNOSIS — R2689 Other abnormalities of gait and mobility: Secondary | ICD-10-CM

## 2021-08-09 DIAGNOSIS — M6281 Muscle weakness (generalized): Secondary | ICD-10-CM | POA: Diagnosis not present

## 2021-08-09 DIAGNOSIS — M79604 Pain in right leg: Secondary | ICD-10-CM

## 2021-08-09 DIAGNOSIS — R2681 Unsteadiness on feet: Secondary | ICD-10-CM

## 2021-08-09 DIAGNOSIS — Z9181 History of falling: Secondary | ICD-10-CM

## 2021-08-09 DIAGNOSIS — R262 Difficulty in walking, not elsewhere classified: Secondary | ICD-10-CM

## 2021-08-09 DIAGNOSIS — M79651 Pain in right thigh: Secondary | ICD-10-CM
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
## 2021-08-11 ENCOUNTER — Encounter: Payer: Self-pay | Admitting: Physical Therapy

## 2021-08-11 ENCOUNTER — Ambulatory Visit: Payer: Medicaid Other | Admitting: Physical Therapy

## 2021-08-11 DIAGNOSIS — R2681 Unsteadiness on feet: Secondary | ICD-10-CM

## 2021-08-11 DIAGNOSIS — M6281 Muscle weakness (generalized): Secondary | ICD-10-CM

## 2021-08-11 DIAGNOSIS — Z9181 History of falling: Secondary | ICD-10-CM

## 2021-08-11 DIAGNOSIS — R2689 Other abnormalities of gait and mobility: Secondary | ICD-10-CM

## 2021-08-11 DIAGNOSIS — R262 Difficulty in walking, not elsewhere classified: Secondary | ICD-10-CM

## 2021-08-11 DIAGNOSIS — M79651 Pain in right thigh: Secondary | ICD-10-CM

## 2021-08-11 DIAGNOSIS — M79604 Pain in right leg: Secondary | ICD-10-CM
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
## 2021-08-16 ENCOUNTER — Ambulatory Visit: Payer: Medicaid Other | Admitting: Physical Therapy

## 2021-08-18 ENCOUNTER — Ambulatory Visit: Payer: Medicaid Other | Admitting: Physical Therapy

## 2021-08-18 ENCOUNTER — Encounter: Payer: Self-pay | Admitting: Physical Therapy

## 2021-08-18 DIAGNOSIS — R262 Difficulty in walking, not elsewhere classified: Secondary | ICD-10-CM

## 2021-08-18 DIAGNOSIS — R2689 Other abnormalities of gait and mobility: Secondary | ICD-10-CM

## 2021-08-18 DIAGNOSIS — M6281 Muscle weakness (generalized): Secondary | ICD-10-CM | POA: Diagnosis not present

## 2021-08-18 DIAGNOSIS — R2681 Unsteadiness on feet: Secondary | ICD-10-CM

## 2021-08-18 DIAGNOSIS — Z9181 History of falling: Secondary | ICD-10-CM

## 2021-08-18 DIAGNOSIS — M79651 Pain in right thigh: Secondary | ICD-10-CM

## 2021-08-18 DIAGNOSIS — M79604 Pain in right leg: Secondary | ICD-10-CM
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
## 2021-08-23 ENCOUNTER — Encounter: Payer: Medicaid Other | Admitting: Physical Therapy

## 2021-08-25 ENCOUNTER — Encounter: Payer: Self-pay | Admitting: Physical Therapy

## 2021-08-25 ENCOUNTER — Ambulatory Visit: Payer: Medicaid Other | Admitting: Physical Therapy

## 2021-08-25 DIAGNOSIS — R2689 Other abnormalities of gait and mobility: Secondary | ICD-10-CM

## 2021-08-25 DIAGNOSIS — M6281 Muscle weakness (generalized): Secondary | ICD-10-CM | POA: Diagnosis not present

## 2021-08-25 DIAGNOSIS — R2681 Unsteadiness on feet: Secondary | ICD-10-CM

## 2021-08-25 DIAGNOSIS — M79651 Pain in right thigh: Secondary | ICD-10-CM

## 2021-08-25 DIAGNOSIS — R262 Difficulty in walking, not elsewhere classified: Secondary | ICD-10-CM

## 2021-08-25 DIAGNOSIS — Z9181 History of falling: Secondary | ICD-10-CM

## 2021-08-25 DIAGNOSIS — M79604 Pain in right leg: Secondary | ICD-10-CM
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
## 2021-08-30 ENCOUNTER — Ambulatory Visit: Payer: Medicaid Other | Admitting: Physical Therapy

## 2021-09-01 ENCOUNTER — Ambulatory Visit: Payer: Medicaid Other | Attending: Student | Admitting: Physical Therapy

## 2021-09-01 ENCOUNTER — Telehealth: Payer: Self-pay | Admitting: Physical Therapy

## 2021-09-01 NOTE — Telephone Encounter (Signed)
Spoke with patient on phone regarding her having to care for her nephew's child and missed appointments; also discussed current insurance authorization. Worked on schedule to maintain at 1x/week with times after 5:00 PM.

## 2021-09-08 ENCOUNTER — Ambulatory Visit: Payer: Medicaid Other | Attending: Student | Admitting: Physical Therapy

## 2021-09-08 DIAGNOSIS — R262 Difficulty in walking, not elsewhere classified: Secondary | ICD-10-CM | POA: Insufficient documentation

## 2021-09-08 DIAGNOSIS — R2681 Unsteadiness on feet: Secondary | ICD-10-CM | POA: Insufficient documentation

## 2021-09-08 DIAGNOSIS — R2689 Other abnormalities of gait and mobility: Secondary | ICD-10-CM | POA: Insufficient documentation

## 2021-09-08 DIAGNOSIS — M79651 Pain in right thigh: Secondary | ICD-10-CM | POA: Insufficient documentation

## 2021-09-08 DIAGNOSIS — M6281 Muscle weakness (generalized): Secondary | ICD-10-CM | POA: Insufficient documentation

## 2021-09-08 DIAGNOSIS — M79604 Pain in right leg: Secondary | ICD-10-CM | POA: Diagnosis present

## 2021-09-08 DIAGNOSIS — Z9181 History of falling: Secondary | ICD-10-CM | POA: Diagnosis present
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
## 2021-09-11 ENCOUNTER — Encounter: Payer: Self-pay | Admitting: Physical Therapy

## 2021-09-15 ENCOUNTER — Ambulatory Visit: Payer: Medicaid Other | Admitting: Physical Therapy

## 2021-09-16 ENCOUNTER — Ambulatory Visit: Payer: Medicaid Other | Admitting: Physical Therapy

## 2021-09-16 ENCOUNTER — Encounter: Payer: Self-pay | Admitting: Physical Therapy

## 2021-09-16 DIAGNOSIS — R262 Difficulty in walking, not elsewhere classified: Secondary | ICD-10-CM

## 2021-09-16 DIAGNOSIS — Z9181 History of falling: Secondary | ICD-10-CM

## 2021-09-16 DIAGNOSIS — M79604 Pain in right leg: Secondary | ICD-10-CM

## 2021-09-16 DIAGNOSIS — R2681 Unsteadiness on feet: Secondary | ICD-10-CM

## 2021-09-16 DIAGNOSIS — M6281 Muscle weakness (generalized): Secondary | ICD-10-CM | POA: Diagnosis not present

## 2021-09-16 DIAGNOSIS — R2689 Other abnormalities of gait and mobility: Secondary | ICD-10-CM

## 2021-09-16 DIAGNOSIS — M79651 Pain in right thigh: Secondary | ICD-10-CM
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
## 2021-09-22 ENCOUNTER — Ambulatory Visit: Payer: Medicaid Other | Attending: Student | Admitting: Physical Therapy

## 2021-09-22 DIAGNOSIS — Z9181 History of falling: Secondary | ICD-10-CM | POA: Diagnosis present

## 2021-09-22 DIAGNOSIS — R262 Difficulty in walking, not elsewhere classified: Secondary | ICD-10-CM | POA: Diagnosis present

## 2021-09-22 DIAGNOSIS — M79651 Pain in right thigh: Secondary | ICD-10-CM | POA: Diagnosis present

## 2021-09-22 DIAGNOSIS — R2681 Unsteadiness on feet: Secondary | ICD-10-CM | POA: Diagnosis present

## 2021-09-22 DIAGNOSIS — M6281 Muscle weakness (generalized): Secondary | ICD-10-CM | POA: Diagnosis present

## 2021-09-22 DIAGNOSIS — M79604 Pain in right leg: Secondary | ICD-10-CM

## 2021-09-22 DIAGNOSIS — R2689 Other abnormalities of gait and mobility: Secondary | ICD-10-CM
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
## 2021-09-27 ENCOUNTER — Encounter: Payer: Self-pay | Admitting: Physical Therapy

## 2021-09-27 NOTE — Addendum Note (Signed)
Addended by: Consuela Mimes T on: 09/27/2021 01:16 PM   Modules accepted: Orders

## 2021-09-29 ENCOUNTER — Encounter: Payer: Self-pay | Admitting: Physical Therapy

## 2021-09-29 ENCOUNTER — Ambulatory Visit: Payer: Medicaid Other | Attending: Student | Admitting: Physical Therapy

## 2021-09-29 DIAGNOSIS — M79604 Pain in right leg: Secondary | ICD-10-CM | POA: Insufficient documentation

## 2021-09-29 DIAGNOSIS — M79651 Pain in right thigh: Secondary | ICD-10-CM | POA: Diagnosis present

## 2021-09-29 DIAGNOSIS — R262 Difficulty in walking, not elsewhere classified: Secondary | ICD-10-CM | POA: Insufficient documentation

## 2021-09-29 DIAGNOSIS — M6281 Muscle weakness (generalized): Secondary | ICD-10-CM | POA: Insufficient documentation

## 2021-09-29 DIAGNOSIS — Z9181 History of falling: Secondary | ICD-10-CM | POA: Insufficient documentation

## 2021-09-29 DIAGNOSIS — R2689 Other abnormalities of gait and mobility: Secondary | ICD-10-CM | POA: Insufficient documentation

## 2021-09-29 DIAGNOSIS — R2681 Unsteadiness on feet: Secondary | ICD-10-CM | POA: Insufficient documentation

## 2021-09-29 NOTE — Therapy (Signed)
OUTPATIENT PHYSICAL THERAPY TREATMENT NOTE   Patient Name: Kristin Lopez MRN: [id number] DOB:11/15/1965, 56 y.o., female Today's Date: 09/29/2021  PCP: No PCP on file REFERRING PROVIDER: Corinne Ports, PA-C  END OF SESSION:   PT End of Session - 09/29/21 1723     Visit Number 11    Number of Visits 17    Date for PT Re-Evaluation 09/15/21    Progress Note Due on Visit 10    PT Start Time 1721    PT Stop Time 1800    PT Time Calculation (min) 39 min    Equipment Utilized During Treatment Gait belt    Activity Tolerance Patient tolerated treatment well    Behavior During Therapy WFL for tasks assessed/performed             Past Medical History:  Diagnosis Date   Hyperlipidemia    Past Surgical History:  Procedure Laterality Date   ABDOMINAL HYSTERECTOMY  2021   FEMUR FRACTURE SURGERY     FEMUR IM NAIL Left 02/03/2021   Procedure: INTRAMEDULLARY (IM) RETROGRADE FEMORAL EXCHANGE NAILING;  Surgeon: Shona Needles, MD;  Location: [place];  Service: Orthopedics;  Laterality: Left;   FEMUR IM NAIL Right 06/30/2021   Procedure: EXCHANGE NAILING FEMUR NONUNION;  Surgeon: Shona Needles, MD;  Location: [place];  Service: Orthopedics;  Laterality: Right;   HARDWARE REMOVAL Left 02/03/2021   Procedure: HARDWARE REMOVAL;  Surgeon: Shona Needles, MD;  Location: [place];  Service: Orthopedics;  Laterality: Left;   [place]   Patient Active Problem List   Diagnosis Date Noted   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 06/30/2021   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 02/03/2021   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 01/14/2021      REFERRING DIAG: S72.351K (ICD-10-CM) - 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 Pt is known to this clinic for treatment of left femoral fracture, treated this spring. DOS: 02/03/22 (left) and 06/30/21 (right). Patient has been referred to therapy for LE strengthening and gait training. She is currently WBAT; using 4WW for community ambulation and SPC in home and to navigate stairs. She reports sleeping well, on left side only with pillow between knees; unable to lay on right side. She reports pain with sitting, STS transfer, initial standing and walking (both improve with standing time) and ascending stairs when taking reciprocal steps. When sitting, pt states she shifts off her right hip and extends right leg for optimal comfort. She does have pain relief with ice, asprin and zanaflex. Pt states she is no longer taking Percocet. She does endorse 3 falls in the last 6 months, all occurring prior to most recent surgery on 06/30/21 - falls have occurred in bathroom, bedroom and living room. Pt states she would like to be able to walk without  using an AD and return to activities she was able to perform prior to accident in 2021.    PRECAUTIONS: WBAT        OBJECTIVE: (objective measures completed at initial evaluation unless otherwise dated)     DIAGNOSTIC FINDINGS: Right femur: Internal fixation of incompletely healed mid femoral shaft fracture, which is in near anatomic alignment.   PATIENT SURVEYS:  LEFS 25/80 = 31.3% FOTO 38/54   COGNITION:           Overall cognitive status: Within functional limits for tasks assessed                          SENSATION: Light touch: Impaired  and increased  sensitivity to RLE lateral lower leg and medial knee     MUSCLE LENGTH: Hamstrings: WFL Thomas test: WFL   POSTURE:  weight shift to the left in sitting and standing    PALPATION: Tenderness with light palpation to anterior and medial knee and anterior thigh mid-femur over location of fracture.  No point tenderness over scars. Nerve pain in lateral lower leg (right) and over old scars from first surgery.   LOWER EXTREMITY ROM:   Passive ROM Right eval Left eval Right 09/22/21 Left 09/22/21  Hip flexion 100* 110 90 110  Hip extension 5 10 Not tested Not tested  Hip abduction [facility]  Hip adduction          Hip internal rotation 15* _0 Hip external rotation [facility] WFL WNL WNL  Knee flexion >130 >130 WNL WNL  Knee extension 0 0 0 0  Ankle dorsiflexion 5 5      Ankle plantarflexion          Ankle inversion          Ankle eversion           (Blank rows = not tested)   LOWER EXTREMITY MMT:   MMT Right eval Left eval Right 09/22/21 Left 09/22/21  Hip flexion 2 4- 4+ 5  Hip extension 3+ 4- 4 4  Hip abduction 4- NT (unable to lie on R side) 4 4  Hip adduction          Hip internal rotation          Hip external rotation          Knee flexion 3+ 3+ 4 5  Knee extension _1 4+*  Ankle dorsiflexion 5 5      Ankle plantarflexion          Ankle inversion          Ankle eversion           (Blank rows = not tested)     FUNCTIONAL TESTS:  5 times sit to stand: 31 seconds (with hands) Timed up and go (TUG): 17 seconds (no AD) 6 minute walk test: 793f using SPC  10 meter walk test: *using 4WW*  fast: 12 seconds = 0.83 m/s; self-selected: 18 seconds = 0.56 m/s    GAIT: Assistive device utilized: WEnvironmental consultant- 4 wheeled Comments: antalgic gait pattern; bilateral trendelenburg; equal stride length using 4WW, decresed stride length LLE without device. Decreased stance time on RLE with and w/o device.                  OBSERVATION (09/22/21)             No edema,  erythema, or ecchymosis along  R hip. No leg length discrepancy. No point tenderness along proximal thigh.        TODAY'S TREATMENT   SUBJECTIVE: Patient reports good follow-up with her physician. She reports partial healing of fracture on her R thigh following femoral nail hardware exchange. L side is fully healed per pt report on radiograph. Patient reports she is still WBAT. MD informed her that she needs to continue working on strengthening.      PAIN:  Are you having pain? No Pain location: R mid femur and throughout thigh ; medial knee Pain description: sharp      Therapeutic Exercise     NuStep; Level 5; 5 minutes, seat at 11 - subjective information gathered intermittently during this time, 2 minutes unbilled    In // bars: Dynamic march with contralateral knee touch, forward stepping; 5x D/B, in // bars  Toe tapping with 3 stacked cones; hands hovering above bars with intermittent touch on either side as needed to assist with postural control; 2x10 alternating feet Hurdle step, 3 6-inch hurdles, step-to pattern with alternating leading leg; x5 D/B Sidestep 4x D/B length of bars with Red Tband 3-way hip with Red Tband; 2x5 ea dir  Sit to stand, raised high-low table; 2x10 Minisquat; 2x10, hands hovering above single parallel bar  Attempted unipedal stance - pt unable to maintain greater than 2-3 seconds on either side presently with multiple attempts        *not today* Forward step-up to Airex; x20 on each LE Gait c SPC Total Gym level 22, SL mini squat, 2x6 (performed with RLE only today, may resume bilat LE next visit) Total [employer] level 22, BLE squat, 2x20 Weight shifting onto RLE and LLE with staggered stance, x20 reps Hip abduction, 2x10, 2-lb weight Hip extension, 2x10, 2-lb weight  STS from chair with Airex pad x2; 2x10 reps              -cueing on weight shifting onto RLE for equal weight bearing          PATIENT EDUCATION:  Education details: POC Person  educated: Patient Education method: Explanation Education comprehension: verbalized understanding     HOME EXERCISE PROGRAM:  Access Code [email or web address]     ASSESSMENT:   CLINICAL IMPRESSION: Patient has remaining compensated Trendelenburg, limited functional LE strength required for sit to stand without upper limb assist, and limited capacity for unipedal stance on either lower limb. She is improving c independence with least-restrictive AD. Pt still does need at least [facility] for most functional mobility due to impaired strength and balance; she can negotiate her home without AD when her R groin is not significantly painful. Pt has remaining weakness, difficulty with gait, limitation with sit to stand and transfers, and imbalance necessitating further PT. Current plan of care is focused on robust HEP with 1x/week follow-ups due to limited visits authorized. Pt would benefit from skilled PT to address current limitations in strength, ROM, gait and functional mobility to achieve goal of not requiring AD and ability to participate in hobbies.           OBJECTIVE IMPAIRMENTS Abnormal gait, decreased activity tolerance, decreased balance, decreased endurance, decreased mobility, difficulty walking, decreased strength, postural dysfunction, and pain.    ACTIVITY LIMITATIONS carrying, lifting, bending, sitting, standing, squatting, stairs, transfers, and locomotion level   PARTICIPATION LIMITATIONS: driving, community activity, and yard work   [place] Past/current experiences and Time since onset of injury/illness/exacerbation are also affecting patient's functional outcome.    REHAB  POTENTIAL: Good   CLINICAL DECISION MAKING: Stable/uncomplicated   EVALUATION COMPLEXITY: Low     GOALS: Goals reviewed with patient? Yes   SHORT TERM GOALS: Target date: 08/05/2021    Patient will be independent in home exercise program to improve strength/mobility for better functional independence with  ADLs. Baseline: 07/28/21: Baseline HEP initiated.   09/22/21: Pt is compliant with HEP Goal status: ACHIEVED   2.  Pt will decrease 5TSTS by at least 3 seconds in order to demonstrate clinically significant improvement in LE strength. Baseline: 07/21/21: 31 seconds (w/ hands).   09/22/21: 17.6 seconds (w/ hands) Goal status: ACHIEVED     LONG TERM GOALS: Target date: 09/15/21    Patient will increase FOTO score to equal to or greater than 54 to demonstrate statistically significant improvement in mobility and quality of life.  Baseline: 07/21/21: 38/54.  09/22/21: 44/54 Goal status: NOT MET   2.  Pt will increase strength of global hip and knee, bilaterally, by at least 1/2 MMT grade in order to demonstrate improvement in strength and function Baseline: 07/21/21: ranging 2/5 - 4/5 (please see objective);  09/22/21: Met for all with exception of L knee extension.  Goal status: IN PROGRESS/MOSTLY MET   3.  Pt will decrease TUG to below 14 seconds/decrease in order to demonstrate decreased fall risk. Baseline: 07/21/21: 17 seconds (no AD).  09/22/21: 9.8 seconds (no AD) Goal status: ACHIEVED   4.  Patient (< 41 years old) will complete five times sit to stand test in < 10 seconds indicating an increased LE strength and improved balance. Baseline: 07/21/21: 31 seconds (w/ hands).  09/22/21: 17.6 (w/ hnads)  Goal status: IN PROGRESS   5.  Pt will increase LEFS by at least 9 points in order to demonstrate significant improvement in lower extremity function.   Baseline: 07/21/21: 25/80 = 31.3%.  09/22/21: 37/80 = 46.25% Goal status: ACHIEVED   6.  Pt will increase 6MWT by at least 25m(1661f in order to demonstrate clinically significant improvement in cardiopulmonary endurance and community ambulation. Baseline:  07/28/21: 700 ft. 09/22/21: 1,015 ft Goal status: ACHIEVED     PLAN: PT FREQUENCY: 1x/week   PT DURATION: 4 weeks   PLANNED INTERVENTIONS: Therapeutic exercises, Therapeutic activity,  Neuromuscular re-education, Balance training, Gait training, Patient/Family education, Joint mobilization, Stair training, Dry Needling, and Manual therapy   PLAN FOR NEXT SESSION: Continue with BLE hip and knee strengthening with additional focus on right, progress weight shifting into RLE. Progress with weaning from AD as tolerated. Manual therapy and cryotherapy as needed for pain to improve participation in active PT. Emphasis on HEP with PT visits 1x/week.       JeValentina GuPT, DPT #[id number]]JeEilleen KempfPT 09/29/2021, 5:24 PM

## 2021-10-06 ENCOUNTER — Ambulatory Visit: Payer: Medicaid Other | Admitting: Physical Therapy

## 2021-10-22 IMAGING — MR MR FEMUR*L* W/O CM
9 series · 40 of 40 positions shown · non-contrast
Comparison: None.

CLINICAL DATA: Prior left femur fracture with IM nail placement in
5151.

EXAM:
MR OF THE LEFT FEMUR WITHOUT CONTRAST
TECHNIQUE: Multiplanar, multisequence MR imaging of the left femur was
performed. No intravenous contrast was administered.

[Series 4: T1 · coronal · left · 4.5mm · 1.08mm/px · 4 of 34 slices shown (1 of 2)]
[im 1/34]
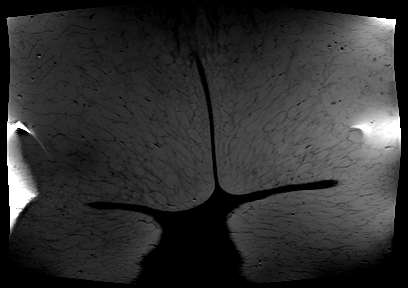
[im 12/34]
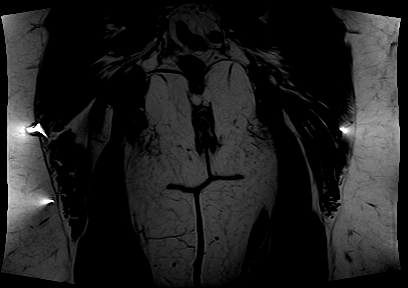
[im 23/34]
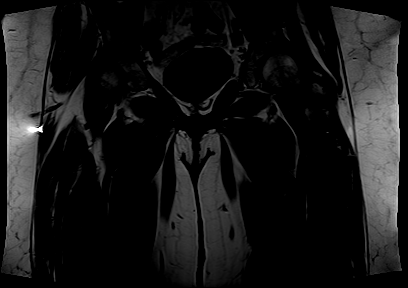
[im 34/34]
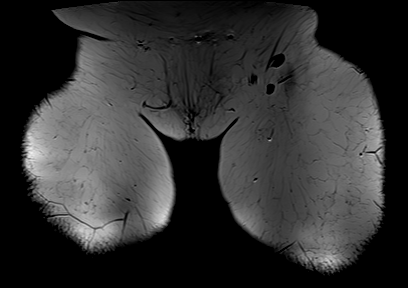

[Series 5: T1 · coronal · left · 4.5mm · 1.08mm/px · 3 of 34 slices shown (2 of 2)]
[im 1/34]
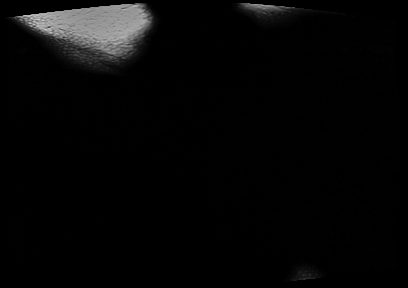
[im 17/34]
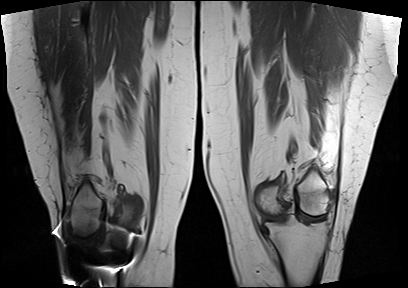
[im 34/34]
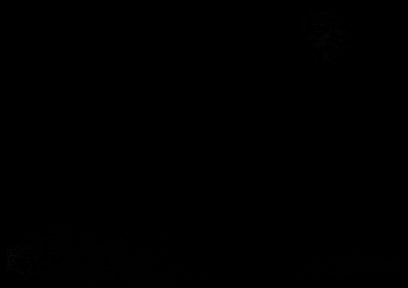

[Series 7: composed cor t1_comp_filt · coronal · left · 5.6mm · 1.08mm/px · 3 of 34 slices shown]
[im 1/34]
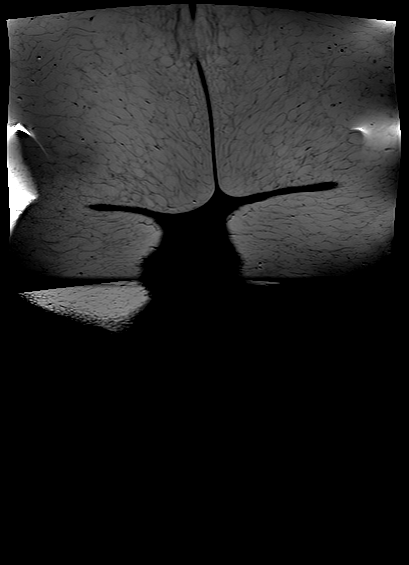
[im 17/34]
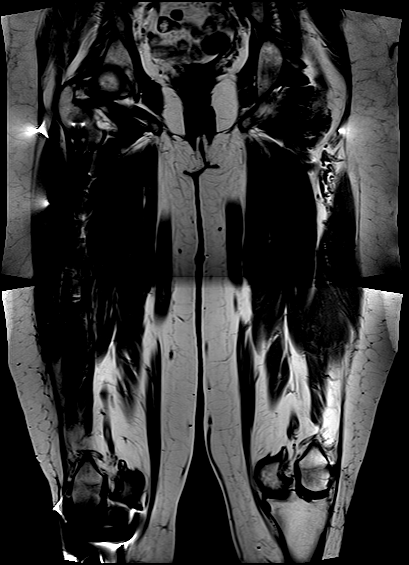
[im 34/34]
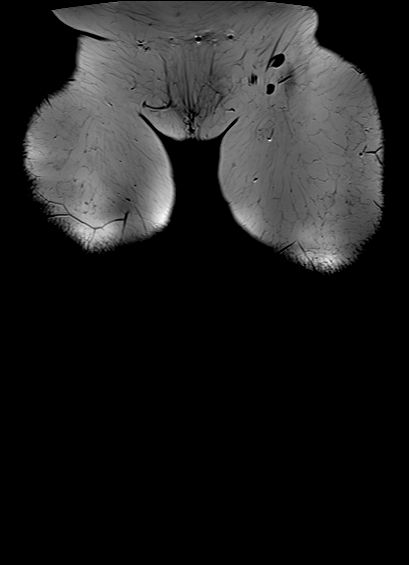

[Series 8: STIR · coronal · left · 4.5mm · 1.08mm/px · 3 of 34 slices shown (1 of 2)]
[im 1/34]
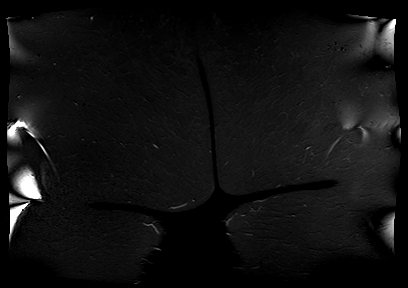
[im 17/34]
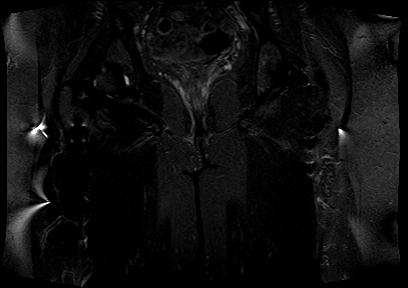
[im 34/34]
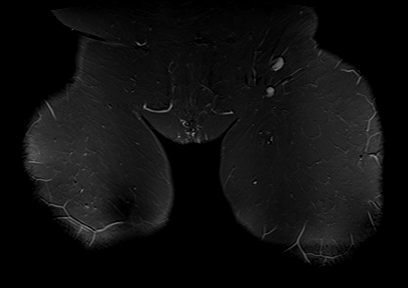

[Series 9: STIR · coronal · left · 4.5mm · 1.08mm/px · 3 of 34 slices shown (2 of 2)]
[im 1/34]
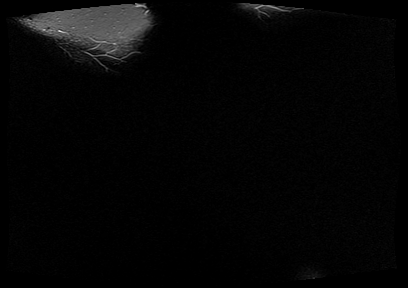
[im 17/34]
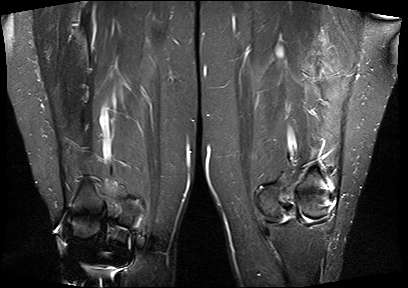
[im 34/34]
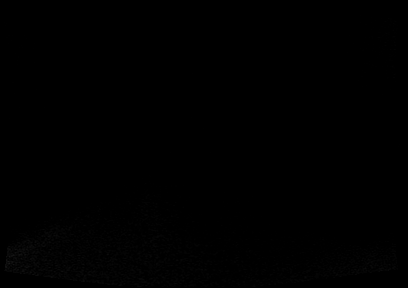

[Series 11: composed cor stir_comp_filt · coronal · left · 5.6mm · 1.08mm/px · 3 of 34 slices shown (1 of 3)]
[im 1/34]
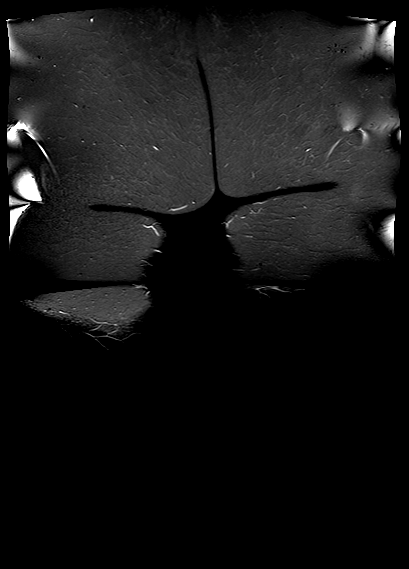
[im 17/34]
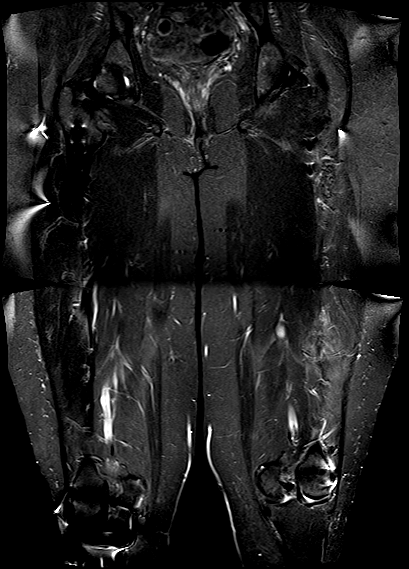
[im 34/34]
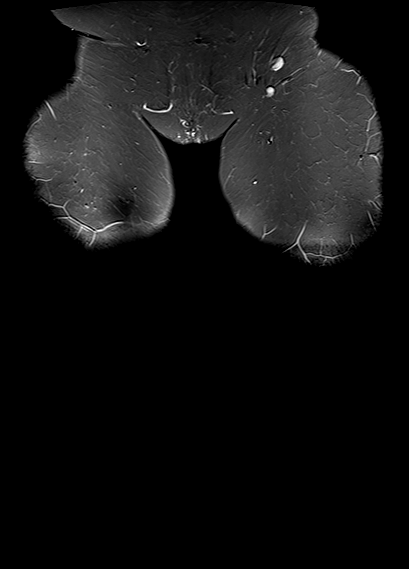

[Series 14: ax t1_comp · axial · left · 5.0mm · 0.86mm/px · z∈[-345,+225]mm · 9 of 96 slices shown]
[im 1/96]
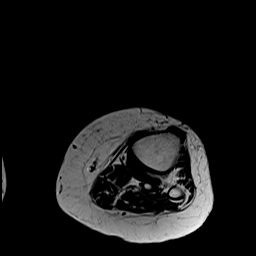
[im 12/96]
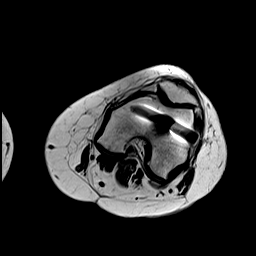
[im 24/96]
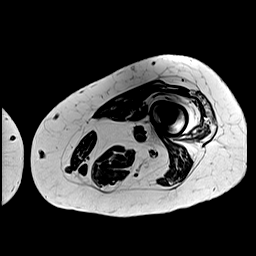
[im 36/96]
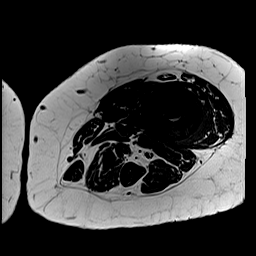
[im 48/96]
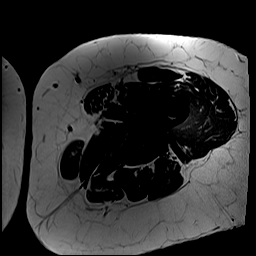
[im 60/96]
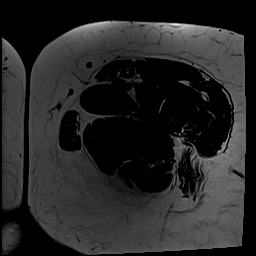
[im 72/96]
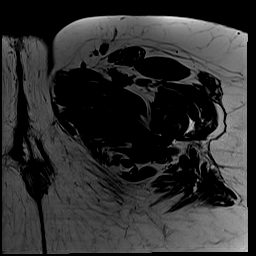
[im 84/96]
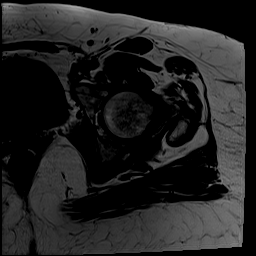
[im 96/96]
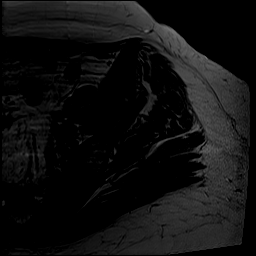

[Series 18: composed cor stir_comp_filt · sagittal · left · 5.6mm · 1.04mm/px · 3 of 34 slices shown (2 of 3)]
[im 1/34]
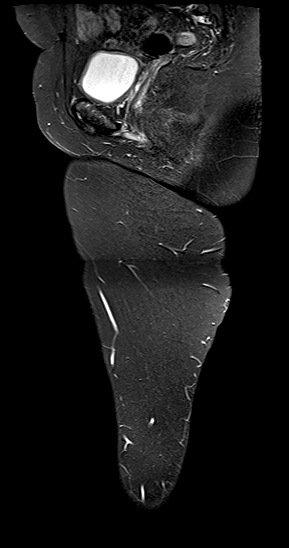
[im 17/34]
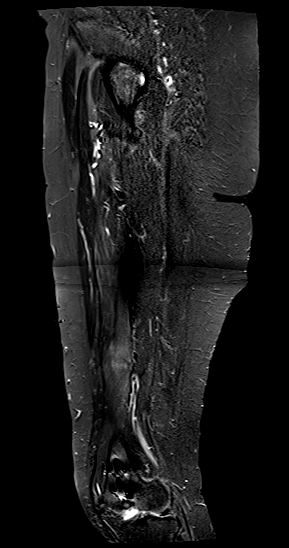
[im 34/34]
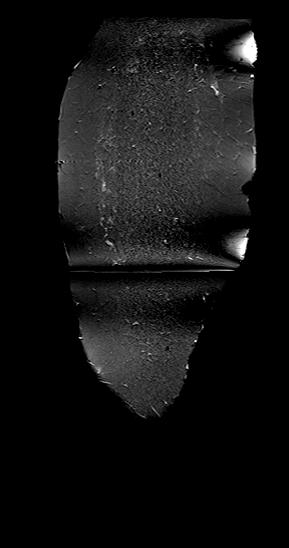

[Series 22: composed cor stir_comp_filt · axial · left · 5.0mm · 0.76mm/px · z∈[-345,+225]mm · 9 of 96 slices shown (3 of 3)]
[im 1/96]
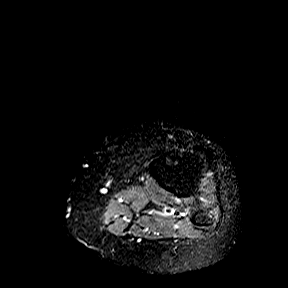
[im 12/96]
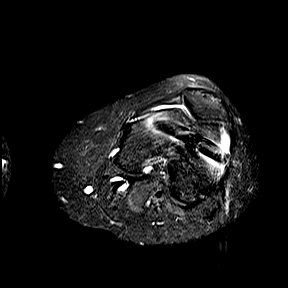
[im 24/96]
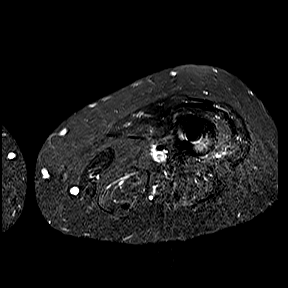
[im 36/96]
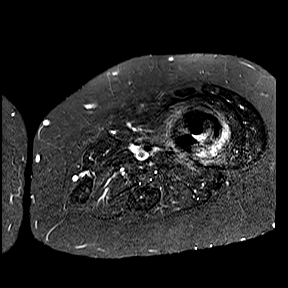
[im 48/96]
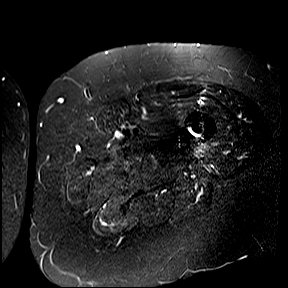
[im 60/96]
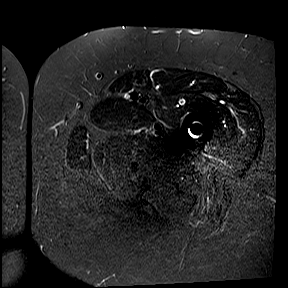
[im 72/96]
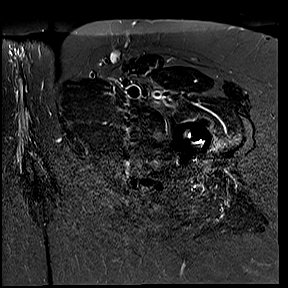
[im 84/96]
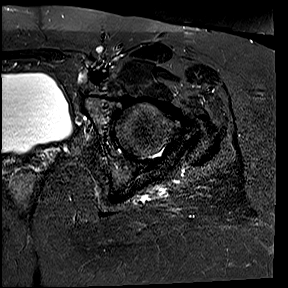
[im 96/96]
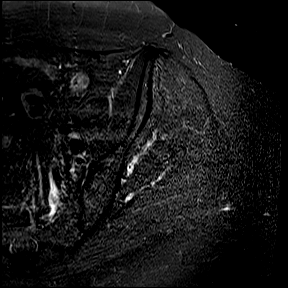

[40 of 40 positions shown; findings below may reference images not displayed]

FINDINGS: Bones/Joint/Cartilage

Bilateral intramedullary femoral nails have resulting in
susceptibility artifact partially obscuring the adjacent soft tissue
and osseous structures.

Left femoral intramedullary nail transfixing a ununited distal
femoral diaphysis fracture in normal anatomic alignment. Mild
surrounding periostitis and bone marrow edema.

No other acute fracture or dislocation. No joint effusion.

Ligaments, Muscles and Tendons
Mild tendinosis of the left hamstring origin.  Muscles are normal.

Soft tissue
No fluid collection or hematoma. No soft tissue mass.
IMPRESSION: Bilateral intramedullary femoral nails have resulting in
susceptibility artifact partially obscuring the adjacent soft tissue
and osseous structures. Left femoral intramedullary nail transfixing
a ununited distal femoral diaphysis fracture in normal anatomic
alignment. Mild surrounding periostitis and bone marrow edema. If
there is further clinical concern, recommend further
characterization with a CT of the left femur.

## 2022-01-18 ENCOUNTER — Ambulatory Visit: Payer: Medicaid Other | Attending: Student | Admitting: Physical Therapy

## 2022-01-18 NOTE — Therapy (Deleted)
OUTPATIENT PHYSICAL THERAPY BALANCE EVALUATION   Patient Name: Kristin Lopez MRN: [id number] DOB:05/14/1965, 56 y.o., female Today's Date: 01/18/2022    Past Medical History:  Diagnosis Date   Hyperlipidemia    Past Surgical History:  Procedure Laterality Date   ABDOMINAL HYSTERECTOMY  2021   FEMUR FRACTURE SURGERY     FEMUR IM NAIL Left 02/03/2021   Procedure: INTRAMEDULLARY (IM) RETROGRADE FEMORAL EXCHANGE NAILING;  Surgeon: Shona Needles, MD;  Location: [place];  Service: Orthopedics;  Laterality: Left;   FEMUR IM NAIL Right 06/30/2021   Procedure: EXCHANGE NAILING FEMUR NONUNION;  Surgeon: Shona Needles, MD;  Location: [place];  Service: Orthopedics;  Laterality: Right;   HARDWARE REMOVAL Left 02/03/2021   Procedure: HARDWARE REMOVAL;  Surgeon: Shona Needles, MD;  Location: [place];  Service: Orthopedics;  Laterality: Left;   KNEE SURGERY     SUBMANDIBULAR GLAND EXCISION  1982   Patient Active Problem List   Diagnosis Date Noted   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  ONSET DATE: ***  FOLLOW UP APPT WITH PROVIDER: {yes/no:20286}    RATIONALE FOR EVALUATION AND TREATMENT: Rehabilitation  SUBJECTIVE:                                                                                                                                                                                         Chief Complaint: Patient is a 56 year old female known to this clinic previously seen for femoral nailing exchange for bilateral femoral Fx non-union after previous ORIF in LLE (02/03/21)  followed by RLE (06/30/21). Pt had been in PT for post-op rehab following R femoral exchange nailing Sx and had to put PT on hold in August 2023.   Pertinent History Patient is a 56 year old female known to this clinic previously seen for femoral nailing exchange for bilateral femoral Fx non-union after previous ORIF in LLE (02/03/21) followed by RLE (06/30/21). Pt had been in PT for post-op rehab following R femoral exchange nailing Sx and had to put PT on hold in August 2023. Pt currently returns for .    Pain: {yes/no:20286} Numbness/Tingling: {yes/no:20286} Focal Weakness: {yes/no:20286} Recent changes in overall health/medication: {yes/no:20286} Prior history of physical therapy for balance:  {yes/no:20286} Falls:  Has patient fallen in last 6 months? {yes/no:20286}, Number of falls: *** Directional pattern for falls: {yes/no:20286} Dominant hand: {RIGHT/LEFT:20294} Imaging: {yes/no:20286}  Prior level of function: {PLOF:24004} Occupational demands: Hobbies: Red flags (bowel/bladder changes, saddle paresthesia, personal history of cancer, h/o spinal tumors, h/o compression fx, h/o abdominal aneurysm, abdominal pain, chills/fever, night sweats, nausea, vomiting, unrelenting pain): Negative  Precautions: None  Weight Bearing Restrictions: No  Living Environment Lives with: {[facility] lives with:25569::"lives with their family"} Lives in: {Lives in:25570} Has following equipment at home: {Assistive devices:23999}   Patient Goals: ***    OBJECTIVE:   Patient Surveys  FOTO: ,predicted improvement to  ABC:  DHI: /100   Cognition Patient is oriented to person, place, and time.  Recent memory is intact.  Remote memory is intact.  Attention span and concentration are intact.  Expressive speech is intact.  Patient's fund of knowledge is within normal limits for educational level.     Gross Musculoskeletal Assessment Tremor: None Bulk: Normal Tone: Normal   GAIT: Distance  walked: *** Assistive device utilized: {Assistive devices:23999} Level of assistance: {Levels of assistance:24026} Comments: ***   Posture: No gross abnormalities noted in standing or seated posture    AROM  AROM (Normal range in degrees) AROM  01/18/2022  Lumbar   Flexion (65)   Extension (30)   Right lateral flexion (25)   Left lateral flexion (25)   Right rotation (30)   Left rotation (30)       Hip Right Left  Flexion (125)    Extension (15)    Abduction (40)    Adduction     Internal Rotation (45)    External Rotation (45)        Knee    Flexion (135)    Extension (0)        Ankle    Dorsiflexion (20)    Plantarflexion (50)    Inversion (35)    Eversion (15    (* = pain; Blank rows = not tested)   LE MMT:  MMT (out of 5) Right 01/18/2022 Left 01/18/2022  Hip flexion    Hip extension    Hip abduction    Hip adduction    Hip internal rotation    Hip external rotation    Knee flexion    Knee extension    Ankle dorsiflexion    Ankle plantarflexion    Ankle inversion    Ankle eversion    (* = pain; Blank rows = not tested)   Sensation Grossly intact to light touch bilateral LEs as determined by testing dermatomes L2-S2. Proprioception, and hot/cold testing deferred on this date.   Reflexes R/L Knee Jerk (L3/4): 2+/2+  Ankle Jerk (S1/2): 2+/2+    Cranial Nerves Visual acuity and visual fields are intact  Extraocular muscles are intact  Facial sensation is intact bilaterally  Facial strength is intact bilaterally  Hearing is normal as tested by gross conversation Palate elevates midline, normal phonation  Shoulder shrug strength is intact  Tongue protrudes midline   Coordination/Cerebellar Finger to Nose: WNL Heel to Shin: WNL Rapid alternating movements: WNL Finger Opposition: WNL Pronator Drift: Negative   FUNCTIONAL OUTCOME MEASURES   Results Comments  BERG /56 Fall risk, in need of intervention  DGI /24   FGA /30    TUG seconds   5TSTS seconds   6 Minute Walk Test    10 Meter Gait Speed Self-selected: s = m/s; Fastest: s = m/s Below normative values for full community  ambulation  (Blank rows = not tested)    TODAY'S TREATMENT  ***   PATIENT EDUCATION:  Education details: *** Person educated: {Person educated:25204} Education method: {Education Method:25205} Education comprehension: {Education Comprehension:25206}   HOME EXERCISE PROGRAM: ***   ASSESSMENT:  CLINICAL IMPRESSION: Patient is a *** y.o. *** who was seen today for physical therapy evaluation and treatment for back pain. Objective impairments include {opptimpairments:25111}. These impairments are limiting patient from {activity limitations:25113}. Personal factors including {Personal factors:25162} are also affecting patient's functional outcome. Patient will benefit from skilled PT to address above impairments and improve overall function.  REHAB POTENTIAL: {rehabpotential:25112}  CLINICAL DECISION MAKING: {clinical decision making:25114}  EVALUATION COMPLEXITY: {Evaluation complexity:25115}   GOALS: Goals reviewed with patient? {yes/no:20286}  SHORT TERM GOALS: Target date: {follow up:25551}  Pt will be independent with HEP in order to improve strength and balance in order to decrease fall risk and improve function at home. Baseline: *** Goal status: INITIAL   LONG TERM GOALS: Target date: {follow up:25551}  Pt will increase FOTO to at least *** to demonstrate significant improvement in function at home related to balance  Baseline:  Goal status: INITIAL  2.  Pt will improve BERG by at least 3 points in order to demonstrate clinically significant improvement in balance.   Baseline: *** Goal status: INITIAL  3.  Pt will improve ABC by at least 13% in order to demonstrate clinically significant improvement in balance confidence.      Baseline: *** Goal status: INITIAL  4. Pt will decrease 5TSTS by at least 3  seconds in order to demonstrate clinically significant improvement in LE strength      Baseline: *** Goal status: INITIAL  5. Pt will improve DGI by at least 3 points in order to demonstrate clinically significant improvement in balance and decreased risk for falls.     Baseline: *** Goal status: INITIAL  6. Pt will decrease TUG to below 14 seconds/decrease in order to demonstrate decreased fall risk.  Baseline: *** Goal status: INITIAL  7. Pt will increase 6MWT by at least 78m (176ft) in order to demonstrate clinically significant improvement in cardiopulmonary endurance and community ambulation   Baseline: *** Goal status: INITIAL   PLAN: PT FREQUENCY: 2x/week  PT DURATION: 12 weeks  PLANNED INTERVENTIONS: Therapeutic exercises, Therapeutic activity, Neuromuscular re-education, Balance training, Gait training, Patient/Family education, Joint manipulation, Joint mobilization, Canalith repositioning, Aquatic Therapy, Dry Needling, Cognitive remediation, Electrical stimulation, Spinal manipulation, Spinal mobilization, Cryotherapy, Moist heat, Traction, Ultrasound, Ionotophoresis 4mg /ml Dexamethasone, and Manual therapy  PLAN FOR NEXT SESSION: ***   Eilleen Kempf 01/18/2022, 7:46 AM

## 2022-03-15 ENCOUNTER — Encounter: Payer: Self-pay | Admitting: Physical Therapy

## 2022-03-15 ENCOUNTER — Ambulatory Visit: Payer: Medicaid Other | Attending: Student | Admitting: Physical Therapy

## 2022-03-15 DIAGNOSIS — M6281 Muscle weakness (generalized): Secondary | ICD-10-CM | POA: Insufficient documentation

## 2022-03-15 DIAGNOSIS — M79604 Pain in right leg: Secondary | ICD-10-CM | POA: Insufficient documentation

## 2022-03-15 DIAGNOSIS — R262 Difficulty in walking, not elsewhere classified: Secondary | ICD-10-CM

## 2022-03-15 NOTE — Addendum Note (Signed)
Addended by: Valentina Gu T on: 03/15/2022 02:23 PM   Modules accepted: Orders

## 2022-03-15 NOTE — Therapy (Signed)
OUTPATIENT PHYSICAL THERAPY BALANCE EVALUATION   Patient Name: Kristin Lopez MRN: [id number] DOB:[birth year], 57 y.o., female Today's Date: 03/15/2022   PT End of Session - 03/15/22 0849     Visit Number 1    Number of Visits 17    Date for PT Re-Evaluation 04/22/22    Authorization Type initial eval 03/15/22    PT Start Time 0731    PT Stop Time 0817    PT Time Calculation (min) 46 min    Activity Tolerance Patient tolerated treatment well    Behavior During Therapy [facility] for tasks assessed/performed             Past Medical History:  Diagnosis Date   Hyperlipidemia    Past Surgical History:  Procedure Laterality Date   ABDOMINAL HYSTERECTOMY  2021   FEMUR FRACTURE SURGERY     FEMUR IM NAIL Left 02/03/2021   Procedure: INTRAMEDULLARY (IM) RETROGRADE FEMORAL EXCHANGE NAILING;  Surgeon: Roby Lofts, MD;  Location: [facility] OR;  Service: Orthopedics;  Laterality: Left;   FEMUR IM NAIL Right 06/30/2021   Procedure: EXCHANGE NAILING FEMUR NONUNION;  Surgeon: Roby Lofts, MD;  Location: [facility] OR;  Service: Orthopedics;  Laterality: Right;   HARDWARE REMOVAL Left 02/03/2021   Procedure: HARDWARE REMOVAL;  Surgeon: Roby Lofts, MD;  Location: [facility] OR;  Service: Orthopedics;  Laterality: Left;   KNEE SURGERY     SUBMANDIBULAR GLAND EXCISION  1982   Patient Active Problem List   Diagnosis Date Noted   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 06/30/2021   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 02/03/2021   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 01/14/2021    PCP: none on file  REFERRING PROVIDER: HaddixGillie Manners, MD  REFERRING DIAGNOSIS: S72.302K (ICD-10-CM) - Unspecified fracture of shaft of left femur, subsequent encounter for closed fracture with nonunion   THERAPY DIAG: Difficulty in walking, not elsewhere classified  Muscle weakness (generalized)  ONSET DATE: 06/30/2021  FOLLOW UP APPT WITH  PROVIDER: No    RATIONALE FOR EVALUATION AND TREATMENT: Rehabilitation  SUBJECTIVE:                                                                                                                                                                                         Chief Complaint: Pt is 57 year old female s/p bilateral femoral shaft nail exchange (most recent surgery 06/30/21) with deficits in gait, mobility, and strength. Pt reports that they have had difficulty with using the stairs, and getting up from a low chair. Pt reports they are in  consistent 3/10 pain in the R LE.     Pertinent History: Pt reports that she typically walks without use of single point cane and only uses it for long distances and shopping. Getting up from a chair and couch is hard without pushing off from arm rest or legs. (Independent sit to stand goal). Can't sleep on right side, because hardware in distal femur is uncomfortable in this position. Some buckling but less recently. Still can't get on her knees for those type of chores. Staying in one position for long periods of time causes them pain and after a long day of being on their feet causes more pain and fatigue. R LE tends to always be numb and has some tingling but less than previous visit.    Pain: Yes  R/L thigh/knee: Now/Best 3/10, Worst 5/10    R Ankle: Now/Best 0/10, Worst 7/10 (tends to improve with repeated motion) Numbness/Tingling: Yes Recent changes in overall health/medication: No Prior history of physical therapy for present condition: Yes, previous episode of care s/p R femoral exchange nailing Falls: Has patient fallen in last 6 months? Yes, Number of falls: 2 Directional pattern for falls: No  Imaging: Yes ,  X-ray 06/30/21:  IMPRESSION: Internal fixation of incompletely healed mid femoral shaft fracture, which is in near anatomic alignment.   Prior level of function: Independent and Independent with household mobility without  device Occupational demands: -   Precautions: None  Weight Bearing Restrictions: No  Living Environment Lives with: lives with their family Lives in: House/apartment ; Pt reports having13 steps and uses railing. Has following equipment at home: Single point cane, grab bars in the shower, and raised commode over the toilet. Some uneven surfaces getting into home that she has to pay attention to decrease occurrence of falls.    Patient Goals: Patient would like to be able to run and exercise, move more, and be able to lift more than 40 lbs. Pt would ike to get back to dancing with a little more ease.     OBJECTIVE:   Patient Surveys  FOTO: Pt reported 25 (FOTO risk adjusted 46),predicted improvement to 28 ABC: deferred   Cognition Patient is oriented to person, place, and time.  Recent memory is intact.  Remote memory is intact.  Attention span and concentration are intact.  Expressive speech is intact.  Patient's fund of knowledge is within normal limits for educational level.     Gross Musculoskeletal Assessment Tremor: None Bulk: Normal Tone: Normal   GAIT: Distance walked: deferred Assistive device utilized: Single point cane used for longer distances Level of assistance: Complete Independence Comments: lateral trunk sway, decreased step length, decreased R knee flexion in swing phase.   Posture: No gross abnormalities noted in standing or seated posture    AROM  AROM (Normal range in degrees) AROM  03/15/2022      Hip Right Left  Flexion (125) 100 110  Extension (15)    Abduction (40)    Adduction     Internal Rotation (45)    External Rotation (45)        Knee    Flexion (135) 135* WNL  Extension (0) +3 +3      Ankle    Dorsiflexion (20)    Plantarflexion (50)    Inversion (35)    Eversion (15    (* = pain; Blank rows = not tested)   PROM Hip flexion: R WNL, L WNL  Knee PROM WNL, pain with end-range R  knee flexion    LE MMT:  MMT  (out of 5) Right 03/15/2022 Left 03/15/2022  Hip flexion 4 4  Hip extension    Hip abduction 3 3  Hip adduction (seated) 5 5  Hip internal rotation 3 3  Hip external rotation 5 5   Knee flexion 5 5  Knee extension 3* 5  Ankle dorsiflexion 5 5  Ankle plantarflexion    Ankle inversion 4 5  Ankle eversion    (* = pain; Blank rows = not tested)   Sensation Deferred   Reflexes Deferred    FUNCTIONAL OUTCOME MEASURES   Results Comments  TUG 8.66 seconds   5TSTS Deferred due to deficit in sit to stand performance   (Blank rows = not tested)    TODAY'S TREATMENT     Therapeutic Exercise - for HEP review, discussion on appropriate exercise/activity modification, PT education  Reviewed baseline home exercises and provided handout for [employer] program (see Access Code); tactile cueing and therapist demonstration utilized as needed for carryover of proper technique to HEP.    Patient education on current condition, role of PT, prognosis, plan of care.     PATIENT EDUCATION:  Education details: Pt was educated on modifications on seat height for increased ability to perform sit to stand, use of single point cane in longer distances for decreased risk of falls, use of HEP for strength gains and increased ambulation, and attention to appropriate fatigue level for decreased risk of falls.  Person educated: Patient Education method: Explanation Education comprehension: verbalized understanding   HOME EXERCISE PROGRAM:  Access Code [email or web address]    ASSESSMENT:  CLINICAL IMPRESSION: Patient is a 57 y.o. female who was seen today for physical therapy evaluation and treatment for bilateral LE pain. Objective impairments include Abnormal gait, decreased balance, decreased endurance, decreased strength, and pain. These impairments are limiting patient from cleaning in quadriped position, shopping for longer distances due to fatigue, and difficulty with sit to stand functions for  transfer activities. Personal factors including Past/current experiences, Time since onset of injury/illness/exacerbation, and 1 comorbidity: Hyperlipidemia  are also affecting patient's functional outcome. Patient will benefit from skilled PT to address above impairments and improve overall function.  REHAB POTENTIAL: Good  CLINICAL DECISION MAKING: Stable/uncomplicated  EVALUATION COMPLEXITY: Low   GOALS: Goals reviewed with patient? No  SHORT TERM GOALS: Target date: 04/15/22  Pt will be independent with HEP in order to improve strength and balance in order to decrease fall risk and improve function at home. Baseline: pt HEP was reviewed for carryover effect 03/15/22 Goal status: INITIAL   LONG TERM GOALS: Target date: 05/13/22  Pt will increase FOTO to at least 61 to demonstrate significant improvement in function at home related to balance and pain. Baseline: 60 (FOTO adjusted score of 46) Goal status: INITIAL  2.  Pt will increase in R knee extension MMT strength for ability to tolerate standing/walking demands needed for negotiating community ambulation. Baseline: 3*/5 03/15/22 Goal status: INITIAL  3.  Pt will increase ability to perform 5TSTS to demonstrate ability to transfer to and from commode with less difficulty.  Baseline: unable to accomplish 5TSTS without arm rests 03/15/22 Goal status: INITIAL  4.  Pt will be able to independently perform climbing a flight of stairs with unilateral UE or less support and reciprocal pattern to demonstrate increased ability to navigate home staircase.  Baseline: heavy upper extremity reliance for stair negotiation and having to perform step-to with her home's stair height.  Goal  status: INITIAL    PLAN: PT FREQUENCY: 2x/week  PT DURATION: 12 weeks  PLANNED INTERVENTIONS: Therapeutic exercises, Therapeutic activity, Neuromuscular re-education, Balance training, Gait training, Patient/Family education, Joint manipulation, Joint  mobilization, Canalith repositioning, Aquatic Therapy, Dry Needling, Cognitive remediation, Electrical stimulation, Spinal manipulation, Spinal mobilization, Cryotherapy, Moist heat, Traction, Ultrasound, Ionotophoresis 4mg /ml Dexamethasone, and Manual therapy  PLAN FOR NEXT SESSION: perform STM for decreased short term pain relief, abduction and quadriceps strengthening, walking with negotiating obstacles, sit-to-stand with Airex on chair for increased height.    Willford Rabideau 03/15/2022, 11:23 AM

## 2022-03-17 ENCOUNTER — Encounter: Payer: Self-pay | Admitting: Physical Therapy

## 2022-03-17 ENCOUNTER — Ambulatory Visit: Payer: Medicaid Other | Admitting: Physical Therapy

## 2022-03-17 DIAGNOSIS — M6281 Muscle weakness (generalized): Secondary | ICD-10-CM

## 2022-03-17 DIAGNOSIS — M79604 Pain in right leg: Secondary | ICD-10-CM

## 2022-03-17 DIAGNOSIS — R262 Difficulty in walking, not elsewhere classified: Secondary | ICD-10-CM | POA: Diagnosis not present

## 2022-03-17 NOTE — Therapy (Signed)
OUTPATIENT PHYSICAL THERAPY TREATMENT NOTE   Patient Name: Kristin Lopez MRN: [id number] DOB:1965/11/26, 57 y.o., female Today's Date: 03/17/2022  PCP: none on file  REFERRING PROVIDER: Haddix, Gillie Manners, MD   REFERRING DIAGNOSIS: S72.302K (ICD-10-CM) - Unspecified fracture of shaft of left femur, subsequent encounter for closed fracture with nonunion    END OF SESSION:   PT End of Session - 03/17/22 0839     Visit Number 2    Number of Visits 17    Date for PT Re-Evaluation 04/22/22    PT Start Time 0741    PT Stop Time 0817    PT Time Calculation (min) 36 min    Activity Tolerance Patient tolerated treatment well    Behavior During Therapy [facility] for tasks assessed/performed             Past Medical History:  Diagnosis Date   Hyperlipidemia    Past Surgical History:  Procedure Laterality Date   ABDOMINAL HYSTERECTOMY  2021   FEMUR FRACTURE SURGERY     FEMUR IM NAIL Left 02/03/2021   Procedure: INTRAMEDULLARY (IM) RETROGRADE FEMORAL EXCHANGE NAILING;  Surgeon: Roby Lofts, MD;  Location: [facility] OR;  Service: Orthopedics;  Laterality: Left;   FEMUR IM NAIL Right 06/30/2021   Procedure: EXCHANGE NAILING FEMUR NONUNION;  Surgeon: Roby Lofts, MD;  Location: [facility] OR;  Service: Orthopedics;  Laterality: Right;   HARDWARE REMOVAL Left 02/03/2021   Procedure: HARDWARE REMOVAL;  Surgeon: Roby Lofts, MD;  Location: [facility] OR;  Service: Orthopedics;  Laterality: Left;   KNEE SURGERY     SUBMANDIBULAR GLAND EXCISION  1982   Patient Active Problem List   Diagnosis Date Noted   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 Getting up from a chair and couch is hard without pushing off from arm rest or legs. (Independent sit to stand goal). Can't sleep on right side, because hardware in distal femur is uncomfortable in this position. Some buckling but less recently. Still can't get on her knees for those type of chores. Staying in one position for long periods of time causes them pain and after a long day of being on their feet causes more pain and fatigue. R LE tends to always be numb and has some tingling but less than previous visit.      Pain: Yes  R/L thigh/knee: Now/Best 3/10, Worst 5/10    R Ankle: Now/Best 0/10, Worst 7/10 (tends to improve with repeated motion) Numbness/Tingling: Yes Recent changes in overall health/medication: No Prior history of physical therapy for present condition: Yes, previous episode of care s/p R femoral exchange nailing Falls: Has patient fallen in last 6 months? Yes, Number of falls: 2 Directional pattern for falls: No   Imaging: Yes ,   X-ray 06/30/21:  IMPRESSION: Internal fixation of incompletely healed mid femoral shaft fracture, which is in near anatomic alignment.     Prior level of  function: Independent and Independent with household mobility without device Occupational demands: -    Precautions: None   Weight Bearing Restrictions: No   Living Environment Lives with: lives with their family Lives in: House/apartment ; Pt reports having13 steps and uses railing. Has following equipment at home: Single point cane, grab bars in the shower, and raised commode over the toilet. Some uneven surfaces getting into home that she has to pay attention to decrease occurrence of falls.      Patient Goals: Patient would like to be able to run and  exercise, move more, and be able to lift more than 40 lbs. Pt would ike to get back to dancing with a little more ease.    PRECAUTIONS: none    SUBJECTIVE:                                                                                                                                                                                      SUBJECTIVE STATEMENT:  Pt reports there have been no major changes. Pt states some soreness from doing HEP with no increase in pain over the past couple of days.    PAIN:  Are you having pain? No   OBJECTIVE: (objective measures completed at initial evaluation unless otherwise dated)   Patient Surveys  FOTO: Pt reported 81 (FOTO risk adjusted 46),predicted improvement to 28 ABC: deferred     Cognition Patient is oriented to person, place, and time.  Recent memory is intact.  Remote memory is intact.  Attention span and concentration are intact.  Expressive speech is intact.  Patient's fund of knowledge is within normal limits for educational level.                            Gross Musculoskeletal Assessment Tremor: None Bulk: Normal Tone: Normal     GAIT: Distance walked: deferred Assistive device utilized: Single point cane used for longer distances Level of assistance: Complete Independence Comments: lateral trunk sway, decreased step length, decreased R knee flexion in swing phase.     Posture: No gross abnormalities noted in standing or seated posture     AROM       AROM (Normal range in degrees) AROM  03/15/2022         Hip Right Left  Flexion (125) 100 110  Extension (15)      Abduction (40)      Adduction       Internal Rotation (45)      External Rotation (45)             Knee  Flexion (135) 135* WNL  Extension (0) +3 +3         Ankle      Dorsiflexion (20)      Plantarflexion (50)      Inversion (35)      Eversion (15      (* = pain; Blank rows = not tested)     PROM Hip flexion: R WNL, L WNL   Knee PROM WNL, pain with end-range R knee flexion      LE MMT:   MMT (out of 5) Right 03/15/2022 Left 03/15/2022  Hip flexion 4 4  Hip extension      Hip abduction 3 3  Hip adduction (seated) 5 5  Hip internal rotation 3 3  Hip external rotation 5 5    Knee flexion 5 5  Knee extension 3* 5  Ankle dorsiflexion 5 5  Ankle plantarflexion      Ankle inversion 4 5  Ankle eversion      (* = pain; Blank rows = not tested)     Sensation Deferred     Reflexes Deferred       FUNCTIONAL OUTCOME MEASURES     Results Comments  TUG 8.66 seconds    5TSTS Deferred due to deficit in sit to stand performance    (Blank rows = not tested)    TODAY'S TREATMENT     Therapeutic Exercise - for improved soft tissue flexibility and extensibility as needed for ROM, improved strength as needed to improve performance of CKC activities/functional movements  - NuStep Seat 10 handles 8 Resistance 3 for 6 minutes - Sit-to-stand from an Airex pad in chair 2x10 - negotiate obstacles for length of treadmill 2x8 down and backs  - Airex pad and two 6 inch hurdles - Banded lateral stepping with Red Theraband on blue ladder 2x15 each leg - Step-ups on 6 inch step with 3 sec hold on single leg 2x10 each leg   PATIENT EDUCATION:  Education details: Pt was educated on modifications on seat height for increased ability to perform sit to stand, use of single point cane in longer distances for decreased risk of falls, use of HEP for strength gains and increased ambulation, and attention to appropriate fatigue level for decreased risk of falls.  Person educated: Patient Education method: Explanation Education comprehension: verbalized understanding     HOME EXERCISE PROGRAM: Access Code: [email or web address] URL: [email or web address] Date: 03/17/2022 Prepared by: Valentina Gu  Exercises - Supine Active Straight Leg Raise  - 2 x daily - 7 x weekly - 2 sets - 10 reps - Supine Bridge  - 2 x  daily - 7 x weekly - 2 sets - 10 reps - Standing Anterior Toe Taps  - 2 x daily - 7 x weekly - 2 sets - 10 reps - Standing 3-Way Kick  - 2 x daily - 7 x weekly - 2 sets - 10 reps - Side Stepping with Counter Support  - 2 x daily - 7 x weekly - 2 sets - 10 reps - Mini Squat with Counter Support  - 2 x daily - 7 x weekly - 2 sets - 10 reps     ASSESSMENT:   CLINICAL IMPRESSION: Patient fatigued by R single leg stance with Airex and single leg step up that mimicking stance phase of gait for community ambulation and leg strength for standing transfers. When negotiating obstacles right single leg stance bought some challenge and improved with obstacle clearance with repetition and reported no  increase in pain. Patient has ongoing LE weakness and difficulty with transferring and stair/obstacle negotiation as well as community-level mobility necessitating further PT intervention. Pt will continue to benefit from skilled PT services to address deficits and improve function.    GOALS: Goals reviewed with patient? No   SHORT TERM GOALS: Target date: 04/15/22   Pt will be independent with HEP in order to improve strength and balance in order to decrease fall risk and improve function at home. Baseline: pt HEP was reviewed for carryover effect 03/15/22 Goal status: INITIAL     LONG TERM GOALS: Target date: 05/13/22   Pt will increase FOTO to at least 61 to demonstrate significant improvement in function at home related to balance and pain. Baseline: 60 (FOTO adjusted score of 46) Goal status: INITIAL   2.  Pt will increase in R knee extension MMT strength for ability to tolerate standing/walking demands needed for negotiating community ambulation. Baseline: 3*/5 03/15/22 Goal status: INITIAL   3.  Pt will increase ability to perform 5TSTS to demonstrate ability to transfer to and from commode with less difficulty.  Baseline: unable to accomplish 5TSTS without arm rests 03/15/22 Goal status:  INITIAL   4.  Pt will be able to independently perform climbing a flight of stairs with unilateral UE or less support and reciprocal pattern to demonstrate increased ability to navigate home staircase.  Baseline: heavy upper extremity reliance for stair negotiation and having to perform step-to with her home's stair height.  Goal status: INITIAL     PLAN: PT FREQUENCY: 2x/week   PT DURATION: 12 weeks   PLANNED INTERVENTIONS: Therapeutic exercises, Therapeutic activity, Neuromuscular re-education, Balance training, Gait training, Patient/Family education, Joint manipulation, Joint mobilization, Canalith repositioning, Aquatic Therapy, Dry Needling, Cognitive remediation, Electrical stimulation, Spinal manipulation, Spinal mobilization, Cryotherapy, Moist heat, Traction, Ultrasound, Ionotophoresis 4mg /ml Dexamethasone, and Manual therapy   PLAN FOR NEXT SESSION: perform STM for decreased short term pain relief, abduction and quadriceps strengthening, walking with negotiating obstacles, sit-to-stand with Airex on chair for increased height.     Alexi Geibel, Student-PT 03/17/2022, 10:32 AM

## 2022-03-22 ENCOUNTER — Encounter: Payer: Self-pay | Admitting: Physical Therapy

## 2022-03-22 ENCOUNTER — Ambulatory Visit: Payer: Medicaid Other | Admitting: Physical Therapy

## 2022-03-22 DIAGNOSIS — M6281 Muscle weakness (generalized): Secondary | ICD-10-CM

## 2022-03-22 DIAGNOSIS — R262 Difficulty in walking, not elsewhere classified: Secondary | ICD-10-CM | POA: Diagnosis not present

## 2022-03-22 NOTE — Therapy (Unsigned)
OUTPATIENT PHYSICAL THERAPY TREATMENT NOTE   Patient Name: Kristin Lopez MRN: [id number] DOB:1965/10/20, 57 y.o., female Today's Date: 03/22/2022  PCP: none on file  REFERRING PROVIDER: Haddix, Gillie Manners, MD   REFERRING DIAGNOSIS: S72.302K (ICD-10-CM) - Unspecified fracture of shaft of left femur, subsequent encounter for closed fracture with nonunion    END OF SESSION:   PT End of Session - 03/22/22 0925     Visit Number 3    Number of Visits 17    Date for PT Re-Evaluation 04/22/22    PT Start Time 0815    PT Stop Time 0859    PT Time Calculation (min) 44 min    Activity Tolerance Patient tolerated treatment well    Behavior During Therapy [facility] for tasks assessed/performed              Past Medical History:  Diagnosis Date   Hyperlipidemia    Past Surgical History:  Procedure Laterality Date   ABDOMINAL HYSTERECTOMY  2021   FEMUR FRACTURE SURGERY     FEMUR IM NAIL Left 02/03/2021   Procedure: INTRAMEDULLARY (IM) RETROGRADE FEMORAL EXCHANGE NAILING;  Surgeon: Roby Lofts, MD;  Location: [facility] OR;  Service: Orthopedics;  Laterality: Left;   FEMUR IM NAIL Right 06/30/2021   Procedure: EXCHANGE NAILING FEMUR NONUNION;  Surgeon: Roby Lofts, MD;  Location: [facility] OR;  Service: Orthopedics;  Laterality: Right;   HARDWARE REMOVAL Left 02/03/2021   Procedure: HARDWARE REMOVAL;  Surgeon: Roby Lofts, MD;  Location: [facility] OR;  Service: Orthopedics;  Laterality: Left;   KNEE SURGERY     SUBMANDIBULAR GLAND EXCISION  1982   Patient Active Problem List   Diagnosis Date Noted   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  Rationale for Evaluation and Treatment Rehabilitation  PERTINENT HISTORY: Pt reports that she typically walks without use of single point cane and only uses it for long distances and shopping. Getting up from a chair and couch is hard without pushing off from arm rest or legs. (Independent sit to stand goal). Can't sleep on right side, because hardware in distal femur is uncomfortable in this position. Some buckling but less recently. Still can't get on her knees for those type of chores. Staying in one position for long periods of time causes them pain and after a long day of being on their feet causes more pain and fatigue. R LE tends to always be numb and has some tingling but less than previous visit.      Pain: Yes  R/L thigh/knee: Now/Best 3/10, Worst 5/10    R Ankle: Now/Best 0/10, Worst 7/10 (tends to improve with repeated motion) Numbness/Tingling: Yes Recent changes in overall health/medication: No Prior history of physical therapy for present condition: Yes, previous episode of care s/p R femoral exchange nailing Falls: Has patient fallen in last 6 months? Yes, Number of falls: 2 Directional pattern for falls: No   Imaging: Yes ,   X-ray 06/30/21:  IMPRESSION: Internal fixation of incompletely healed mid femoral shaft fracture, which is in near anatomic alignment.     Prior level of function: Independent and Independent  with household mobility without device Occupational demands: -    Precautions: None   Weight Bearing Restrictions: No   Living Environment Lives with: lives with their family Lives in: House/apartment ; Pt reports having13 steps and uses railing. Has following equipment at home: Single point cane, grab bars in the shower, and raised commode over the toilet. Some uneven surfaces getting into home that she has to pay attention to decrease occurrence of falls.      Patient Goals: Patient would like to be able to run and exercise, move more, and be  able to lift more than 40 lbs. Pt would ike to get back to dancing with a little more ease.    PRECAUTIONS: none    SUBJECTIVE:                                                                                                                                                                                      SUBJECTIVE STATEMENT:  Pt reports there have been no major changes. Pt states there has been no increase in pain over the weekend. Pt reports that she will be moving into a new home come March and new home only has a few steps into the single level home. Pt reports that they have done their HEP occasionally at home but not as consistently has they would like.    PAIN:  Are you having pain? No   OBJECTIVE: (objective measures completed at initial evaluation unless otherwise dated)   Patient Surveys  FOTO: Pt reported 25 (FOTO risk adjusted 46),predicted improvement to 79 ABC: deferred     Cognition Patient is oriented to person, place, and time.  Recent memory is intact.  Remote memory is intact.  Attention span and concentration are intact.  Expressive speech is intact.  Patient's fund of knowledge is within normal limits for educational level.                            Gross Musculoskeletal Assessment Tremor: None Bulk: Normal Tone: Normal     GAIT: Distance walked: deferred Assistive device utilized: Single point cane used for longer distances Level of assistance: Complete Independence Comments: lateral trunk sway, decreased step length, decreased R knee flexion in swing phase.     Posture: No gross abnormalities noted in standing or seated posture     AROM       AROM (Normal range in degrees) AROM  03/15/2022         Hip Right Left  Flexion (125) 100 110  Extension (15)      Abduction (40)  Adduction       Internal Rotation (45)      External Rotation (45)             Knee      Flexion (135) 135* WNL  Extension (0) +3 +3         Ankle       Dorsiflexion (20)      Plantarflexion (50)      Inversion (35)      Eversion (15      (* = pain; Blank rows = not tested)     PROM Hip flexion: R WNL, L WNL  Knee PROM WNL, pain with end-range R knee flexion      LE MMT:   MMT (out of 5) Right 03/15/2022 Left 03/15/2022  Hip flexion 4 4  Hip extension      Hip abduction 3 3  Hip adduction (seated) 5 5  Hip internal rotation 3 3  Hip external rotation 5 5    Knee flexion 5 5  Knee extension 3* 5  Ankle dorsiflexion 5 5  Ankle plantarflexion      Ankle inversion 4 5  Ankle eversion      (* = pain; Blank rows = not tested)     Sensation Deferred     Reflexes Deferred       FUNCTIONAL OUTCOME MEASURES     Results Comments  TUG 8.66 seconds    5TSTS Deferred due to deficit in sit to stand performance    (Blank rows = not tested)    TODAY'S TREATMENT     Therapeutic Exercise - for improved soft tissue flexibility and extensibility as needed for ROM, improved strength as needed to improve performance of CKC activities/functional movements  - NuStep Seat 10 handles 8 Resistance 3 for 5 minutes  - Sit-to-stand from an Airex pad in chair 2x12  - negotiate obstacles for length of  // bars 3x5 D/B   - Airex pad and two 12 inch hurdles  - Banded lateral stepping with Red Theraband on blue ladder 5 D/B length of the agility ladder   - Step-ups on 6 inch step with 3 sec hold on single limb 2x10 each leg  - used unilateral UE support and minimal cueing on holding single limb stance   PATIENT EDUCATION:  Education details: Pt was educated on use of HEP for strength gains and increased ambulation.  Person educated: Patient Education method: Explanation Education comprehension: verbalized understanding     HOME EXERCISE PROGRAM: Access Code: [email or web address] URL: [email or web address] Date: 03/17/2022 Prepared by: Valentina Gu  Exercises - Supine Active Straight Leg Raise  - 2 x daily - 7 x  weekly - 2 sets - 10 reps - Supine Bridge  - 2 x daily - 7 x weekly - 2 sets - 10 reps - Standing Anterior Toe Taps  - 2 x daily - 7 x weekly - 2 sets - 10 reps - Standing 3-Way Kick  - 2 x daily - 7 x weekly - 2 sets - 10 reps - Side Stepping with Counter Support  - 2 x daily - 7 x weekly - 2 sets - 10 reps - Mini Squat with Counter Support  - 2 x daily - 7 x weekly - 2 sets - 10 reps     ASSESSMENT:   CLINICAL IMPRESSION: Patient did well with obstacle negotiation with 12 inch hurdles and Airex pad and had a small decrease in walking pace. No increase in pain  with obstacle negotiation. Patient has ongoing LE weakness and difficulty using stairs with a single limb hold. Pt will continue to benefit from skilled PT services to address deficits and improve function.    GOALS: Goals reviewed with patient? No   SHORT TERM GOALS: Target date: 04/15/22   Pt will be independent with HEP in order to improve strength and balance in order to decrease fall risk and improve function at home. Baseline: pt HEP was reviewed for carryover effect 03/15/22 Goal status: INITIAL     LONG TERM GOALS: Target date: 05/13/22   Pt will increase FOTO to at least 61 to demonstrate significant improvement in function at home related to balance and pain. Baseline: 60 (FOTO adjusted score of 46) Goal status: INITIAL   2.  Pt will increase in R knee extension MMT strength for ability to tolerate standing/walking demands needed for negotiating community ambulation. Baseline: 3*/5 03/15/22 Goal status: INITIAL   3.  Pt will increase ability to perform 5TSTS to demonstrate ability to transfer to and from commode with less difficulty.  Baseline: unable to accomplish 5TSTS without arm rests 03/15/22 Goal status: INITIAL   4.  Pt will be able to independently perform climbing a flight of stairs with unilateral UE or less support and reciprocal pattern to demonstrate increased ability to navigate home staircase.   Baseline: heavy upper extremity reliance for stair negotiation and having to perform step-to with her home's stair height.  Goal status: INITIAL     PLAN: PT FREQUENCY: 2x/week   PT DURATION: 12 weeks   PLANNED INTERVENTIONS: Therapeutic exercises, Therapeutic activity, Neuromuscular re-education, Balance training, Gait training, Patient/Family education, Joint manipulation, Joint mobilization, Canalith repositioning, Aquatic Therapy, Dry Needling, Cognitive remediation, Electrical stimulation, Spinal manipulation, Spinal mobilization, Cryotherapy, Moist heat, Traction, Ultrasound, Ionotophoresis 4mg /ml Dexamethasone, and Manual therapy   PLAN FOR NEXT SESSION: perform STM for decreased short term pain relief, abduction and quadriceps strengthening, walking with negotiating obstacles, sit-to-stand with Airex on chair for increased height.     Raegan Winders, Student-PT 03/22/2022, 9:28 AM

## 2022-03-24 ENCOUNTER — Ambulatory Visit: Payer: Medicaid Other | Admitting: Physical Therapy

## 2022-03-24 ENCOUNTER — Encounter: Payer: Self-pay | Admitting: Physical Therapy

## 2022-03-24 DIAGNOSIS — R262 Difficulty in walking, not elsewhere classified: Secondary | ICD-10-CM | POA: Diagnosis not present

## 2022-03-24 DIAGNOSIS — M6281 Muscle weakness (generalized): Secondary | ICD-10-CM

## 2022-03-24 NOTE — Therapy (Signed)
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    END OF SESSION:   PT End of Session - 03/24/22 0948     Visit Number 4    Number of Visits 17    Date for PT Re-Evaluation 04/22/22    PT Start Time 0815    PT Stop Time 0900    PT Time Calculation (min) 45 min    Activity Tolerance Patient tolerated treatment well    Behavior During Therapy [facility] for tasks assessed/performed               Past Medical History:  Diagnosis Date   Hyperlipidemia    Past Surgical History:  Procedure Laterality Date   ABDOMINAL HYSTERECTOMY  2021   FEMUR FRACTURE SURGERY     FEMUR IM NAIL Left 02/03/2021   Procedure: INTRAMEDULLARY (IM) RETROGRADE FEMORAL EXCHANGE NAILING;  Surgeon: Roby Lofts, MD;  Location: [facility] OR;  Service: Orthopedics;  Laterality: Left;   FEMUR IM NAIL Right 06/30/2021   Procedure: EXCHANGE NAILING FEMUR NONUNION;  Surgeon: Roby Lofts, MD;  Location: [facility] OR;  Service: Orthopedics;  Laterality: Right;   HARDWARE REMOVAL Left 02/03/2021   Procedure: HARDWARE REMOVAL;  Surgeon: Roby Lofts, MD;  Location: [facility] OR;  Service: Orthopedics;  Laterality: Left;   KNEE SURGERY     SUBMANDIBULAR GLAND EXCISION  1982   Patient Active Problem List   Diagnosis Date Noted   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 Getting up from a chair and couch is hard without pushing off from arm rest or legs. (Independent sit to stand goal). Can't sleep on right side, because hardware in distal femur is uncomfortable in this position. Some buckling but less recently. Still can't get on her knees for those type of chores. Staying in one position for long periods of time causes them pain and after a long day of being on their feet causes more pain and fatigue. R LE tends to always be numb and has some tingling but less than previous visit.      Pain: Yes  R/L thigh/knee: Now/Best 3/10, Worst 5/10    R Ankle: Now/Best 0/10, Worst 7/10 (tends to improve with repeated motion) Numbness/Tingling: Yes Recent changes in overall health/medication: No Prior history of physical therapy for present condition: Yes, previous episode of care s/p R femoral exchange nailing Falls: Has patient fallen in last 6 months? Yes, Number of falls: 2 Directional pattern for falls: No   Imaging: Yes ,   X-ray 06/30/21:  IMPRESSION: Internal fixation of incompletely healed mid femoral shaft fracture, which is in near anatomic alignment.  Prior level of function: Independent and Independent with household mobility without device Occupational demands: -    Precautions: None   Weight Bearing Restrictions: No   Living Environment Lives with: lives with their family Lives in: House/apartment ; Pt reports having13 steps and uses railing. Has following equipment at home: Single point cane, grab bars in the shower, and raised commode over the toilet. Some uneven surfaces getting into home that she has to pay attention to decrease occurrence of falls.      Patient Goals:  Patient would like to be able to run and exercise, move more, and be able to lift more than 40 lbs. Pt would ike to get back to dancing with a little more ease.    PRECAUTIONS: none    SUBJECTIVE:                                                                                                                                                                                      SUBJECTIVE STATEMENT: Pt reports no pain increase more than usual but does notice a twinge of pain when she twists her knee. Pt reports there have been no major changes. Pt reports that she will be moving into a new home come March and new home only has a few steps into the single level home. Pt reports that they have not been able to do their HEP because of packing to move to new home.    PAIN:  Are you having pain? No   OBJECTIVE: (objective measures completed at initial evaluation unless otherwise dated)   Patient Surveys  FOTO: Pt reported 27 (FOTO risk adjusted 46),predicted improvement to 33 ABC: deferred     Cognition Patient is oriented to person, place, and time.  Recent memory is intact.  Remote memory is intact.  Attention span and concentration are intact.  Expressive speech is intact.  Patient's fund of knowledge is within normal limits for educational level.                            Gross Musculoskeletal Assessment Tremor: None Bulk: Normal Tone: Normal     GAIT: Distance walked: deferred Assistive device utilized: Single point cane used for longer distances Level of assistance: Complete Independence Comments: lateral trunk sway, decreased step length, decreased R knee flexion in swing phase.     Posture: No gross abnormalities noted in standing or seated posture     AROM       AROM (Normal range in degrees) AROM  03/15/2022         Hip Right Left  Flexion (125) 100 110  Extension (15)      Abduction (40)      Adduction       Internal Rotation (45)      External  Rotation (45)             Knee      Flexion (135) 135* WNL  Extension (0) +3 +3         Ankle      Dorsiflexion (20)      Plantarflexion (50)      Inversion (35)      Eversion (15      (* = pain; Blank rows = not tested)     PROM Hip flexion: R WNL, L WNL  Knee PROM WNL, pain with end-range R knee flexion      LE MMT:   MMT (out of 5) Right 03/15/2022 Left 03/15/2022  Hip flexion 4 4  Hip extension      Hip abduction 3 3  Hip adduction (seated) 5 5  Hip internal rotation 3 3  Hip external rotation 5 5    Knee flexion 5 5  Knee extension 3* 5  Ankle dorsiflexion 5 5  Ankle plantarflexion      Ankle inversion 4 5  Ankle eversion      (* = pain; Blank rows = not tested)     Sensation Deferred     Reflexes Deferred       FUNCTIONAL OUTCOME MEASURES     Results Comments  TUG 8.66 seconds    5TSTS Deferred due to deficit in sit to stand performance    (Blank rows = not tested)    TODAY'S TREATMENT     Therapeutic Exercise - improved strength as needed to improve performance of CKC activities/functional movements, for improved ability to perform stair negotiation and transferring  - NuStep Seat 10 handles 8 Resistance 3 for 5 minutes - for soft tissue warm-up to improve muscle performance and improve mobility   -subjective information gathered during this time   - Sit-to-stand from an Airex pad in chair 2x12  - Tandem stance on Airex: 2x45 secs (RLE behind and then switched to LLE)  - Banded lateral stepping with Red Theraband on blue ladder 5 D/B length of the agility ladder   - Step-ups on 6 inch step with 3 sec hold on single limb 3x12 each leg - used unilateral UE support and minimal cueing on holding single limb stance - 3-way hip exercise with 4lbs. 2x12 bilateral  - used unilateral UE support with minimal cueing for slow and controlled movements     PATIENT EDUCATION:  Education details: Pt was educated on use of HEP for strength gains and  increased ambulation.  Person educated: Patient Education method: Explanation Education comprehension: verbalized understanding     HOME EXERCISE PROGRAM: Access Code: [email or web address] URL: [email or web address] Date: 03/17/2022 Prepared by: Consuela Mimes  Exercises - Supine Active Straight Leg Raise  - 2 x daily - 7 x weekly - 2 sets - 10 reps - Supine Bridge  - 2 x daily - 7 x weekly - 2 sets - 10 reps - Standing Anterior Toe Taps  - 2 x daily - 7 x weekly - 2 sets - 10 reps - Standing 3-Way Kick  - 2 x daily - 7 x weekly - 2 sets - 10 reps - Side Stepping with Counter Support  - 2 x daily - 7 x weekly - 2 sets - 10 reps - Mini Squat with Counter Support  -  2 x daily - 7 x weekly - 2 sets - 10 reps     ASSESSMENT:   CLINICAL IMPRESSION: Patient had some fatigue with increase in repetitions with the step-ups and 3 sec hold. Pt had difficulty with weighted 3-way hip exercise with 4lbs and tandem stance on Airex. Patient has ongoing LE weakness and difficulty using stairs with a single limb hold. Pt will continue to benefit from skilled PT services to address deficits and improve function.    GOALS: Goals reviewed with patient? No   SHORT TERM GOALS: Target date: 04/15/22   Pt will be independent with HEP in order to improve strength and balance in order to decrease fall risk and improve function at home. Baseline: pt HEP was reviewed for carryover effect 03/15/22 Goal status: INITIAL     LONG TERM GOALS: Target date: 05/13/22   Pt will increase FOTO to at least 61 to demonstrate significant improvement in function at home related to balance and pain. Baseline: 60 (FOTO adjusted score of 46) Goal status: INITIAL   2.  Pt will increase in R knee extension MMT strength for ability to tolerate standing/walking demands needed for negotiating community ambulation. Baseline: 3*/5 03/15/22 Goal status: INITIAL   3.  Pt will increase ability to perform 5TSTS to demonstrate  ability to transfer to and from commode with less difficulty.  Baseline: unable to accomplish 5TSTS without arm rests 03/15/22 Goal status: INITIAL   4.  Pt will be able to independently perform climbing a flight of stairs with unilateral UE or less support and reciprocal pattern to demonstrate increased ability to navigate home staircase.  Baseline: heavy upper extremity reliance for stair negotiation and having to perform step-to with her home's stair height.  Goal status: INITIAL     PLAN: PT FREQUENCY: 2x/week   PT DURATION: 12 weeks   PLANNED INTERVENTIONS: Therapeutic exercises, Therapeutic activity, Neuromuscular re-education, Balance training, Gait training, Patient/Family education, Joint manipulation, Joint mobilization, Canalith repositioning, Aquatic Therapy, Dry Needling, Cognitive remediation, Electrical stimulation, Spinal manipulation, Spinal mobilization, Cryotherapy, Moist heat, Traction, Ultrasound, Ionotophoresis 4mg /ml Dexamethasone, and Manual therapy   PLAN FOR NEXT SESSION: perform STM for decreased short term pain relief, abduction and quadriceps strengthening, walking with negotiating obstacles, sit-to-stand with Airex on chair for increased height.      Itay Mella, SPT 03/24/2022, 10:58 AM

## 2022-03-29 ENCOUNTER — Ambulatory Visit: Payer: Medicaid Other | Admitting: Physical Therapy

## 2022-03-29 ENCOUNTER — Encounter: Payer: Self-pay | Admitting: Physical Therapy

## 2022-03-29 DIAGNOSIS — R262 Difficulty in walking, not elsewhere classified: Secondary | ICD-10-CM

## 2022-03-29 DIAGNOSIS — M6281 Muscle weakness (generalized): Secondary | ICD-10-CM

## 2022-03-29 NOTE — Therapy (Signed)
OUTPATIENT PHYSICAL THERAPY TREATMENT NOTE   Patient Name: Kristin Lopez MRN: [id number] DOB:28-Oct-1965, 57 y.o., female Today's Date: 03/29/2022   END OF SESSION:   PT End of Session - 03/29/22 0931     Visit Number 5    Number of Visits 17    Date for PT Re-Evaluation 04/22/22    PT Start Time 0815    PT Stop Time 0901    PT Time Calculation (min) 46 min    Activity Tolerance Patient tolerated treatment well    Behavior During Therapy WFL for tasks assessed/performed            PCP: none on file  REFERRING PROVIDER: Haddix, Gillie Manners, MD   REFERRING DIAGNOSIS: S72.302K (ICD-10-CM) - Unspecified fracture of shaft of left femur, subsequent encounter for closed fracture with nonunion     Past Medical History:  Diagnosis Date   Hyperlipidemia    Past Surgical History:  Procedure Laterality Date   ABDOMINAL HYSTERECTOMY  2021   FEMUR FRACTURE SURGERY     FEMUR IM NAIL Left 02/03/2021   Procedure: INTRAMEDULLARY (IM) RETROGRADE FEMORAL EXCHANGE NAILING;  Surgeon: Roby Lofts, MD;  Location: [facility];  Service: Orthopedics;  Laterality: Left;   FEMUR IM NAIL Right 06/30/2021   Procedure: EXCHANGE NAILING FEMUR NONUNION;  Surgeon: Roby Lofts, MD;  Location: [facility];  Service: Orthopedics;  Laterality: Right;   HARDWARE REMOVAL Left 02/03/2021   Procedure: HARDWARE REMOVAL;  Surgeon: Roby Lofts, MD;  Location: [facility];  Service: Orthopedics;  Laterality: Left;   KNEE SURGERY     SUBMANDIBULAR GLAND EXCISION  1982   Patient Active Problem List   Diagnosis Date Noted   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 Getting up from a chair and couch is hard without pushing off from arm rest or legs. (Independent sit to stand goal). Can't sleep on right side, because hardware in distal femur is uncomfortable in this position. Some buckling but less recently. Still can't get on her knees for those type of chores. Staying in one position for long periods of time causes them pain and after a long day of being on their feet causes more pain and fatigue. R LE tends to always be numb and has some tingling but less than previous visit.      Pain: Yes  R/L thigh/knee: Now/Best 3/10, Worst 5/10    R Ankle: Now/Best 0/10, Worst 7/10 (tends to improve with repeated motion) Numbness/Tingling: Yes Recent changes in overall health/medication: No Prior history of physical therapy for present condition: Yes, previous episode of care s/p R femoral exchange nailing Falls: Has patient fallen in last 6 months? Yes, Number of falls: 2 Directional pattern for falls: No   Imaging: Yes ,   X-ray 06/30/21:  IMPRESSION: Internal fixation of incompletely healed mid femoral shaft fracture, which is in near anatomic alignment.  Prior level of function: Independent and Independent with household mobility without device Occupational demands: -    Precautions: None   Weight Bearing Restrictions: No   Living Environment Lives with: lives with their family Lives in: House/apartment ; Pt reports having13 steps and uses railing. Has following equipment at home: Single point cane, grab bars in the shower, and raised commode over the toilet. Some uneven surfaces getting into home that she has to pay attention to decrease occurrence of falls.      Patient Goals: Patient  would like to be able to run and exercise, move more, and be able to lift more than 40 lbs. Pt would ike to get back to dancing with a little more ease.    PRECAUTIONS: none    SUBJECTIVE:                                                                                                                                                                                      SUBJECTIVE STATEMENT: Pt reports that they have been doing their HEP but has some discomfort in their back with supine bridges. Pt reports there have been no major changes. Pt reports that she will be moving into a new home come March and new home only has a few steps into the single level home.    PAIN:  Are you having pain? No   OBJECTIVE: (objective measures completed at initial evaluation unless otherwise dated)   Patient Surveys  FOTO: Pt reported 55 (FOTO risk adjusted 46),predicted improvement to 61 ABC: deferred     Cognition Patient is oriented to person, place, and time.  Recent memory is intact.  Remote memory is intact.  Attention span and concentration are intact.  Expressive speech is intact.  Patient's fund of knowledge is within normal limits for educational level.                            Gross Musculoskeletal Assessment Tremor: None Bulk: Normal Tone: Normal     GAIT: Distance walked: deferred Assistive device utilized: Single point cane used for longer distances Level of assistance: Complete Independence Comments: lateral trunk sway, decreased step length, decreased R knee flexion in swing phase.     Posture: No gross abnormalities noted in standing or seated posture     AROM       AROM (Normal range in degrees) AROM  03/15/2022         Hip Right Left  Flexion (125) 100 110  Extension (15)      Abduction (40)      Adduction  Internal Rotation (45)      External Rotation (45)             Knee      Flexion (135) 135* WNL  Extension (0) +3 +3         Ankle       Dorsiflexion (20)      Plantarflexion (50)      Inversion (35)      Eversion (15      (* = pain; Blank rows = not tested)     PROM Hip flexion: R WNL, L WNL  Knee PROM WNL, pain with end-range R knee flexion      LE MMT:   MMT (out of 5) Right 03/15/2022 Left 03/15/2022  Hip flexion 4 4  Hip extension      Hip abduction 3 3  Hip adduction (seated) 5 5  Hip internal rotation 3 3  Hip external rotation 5 5    Knee flexion 5 5  Knee extension 3* 5  Ankle dorsiflexion 5 5  Ankle plantarflexion      Ankle inversion 4 5  Ankle eversion      (* = pain; Blank rows = not tested)     Sensation Deferred     Reflexes Deferred       FUNCTIONAL OUTCOME MEASURES     Results Comments  TUG 8.66 seconds    5TSTS Deferred due to deficit in sit to stand performance    (Blank rows = not tested)    TODAY'S TREATMENT     Therapeutic Exercise - improved strength as needed to improve performance of CKC activities/functional movements, for improved ability to perform stair negotiation and transferring  - NuStep Seat 10 handles 8 Resistance 3 for 6 minutes - for soft tissue warm-up to improve muscle performance and improve mobility   -subjective information gathered during this time   - 3-way hip exercise with 4lbs. 2x12 bilateral  - used unilateral UE support with minimal cueing for slow and controlled movements  - Sit-to-stand from chair 2x12  - Tandem stance on Airex: 2x1 minute (RLE behind and then switched to LLE)  - Step-ups on 6 inch step with 3 sec hold on single limb 3x12 bilateral  - used unilateral UE support and minimal cueing on holding single limb stance  - Total gym 2x12  - moderate cueing on keeping heels on the platform and widening stance     *not today* - Banded lateral stepping with Red Theraband on blue ladder 7 D/B length of the agility ladder     PATIENT EDUCATION:  Education details: Pt was educated on use of HEP for strength gains and  increased ambulation. Discussed holding on bridges at this time and using other alternatives for gluteal/posterior chain strengthening.  Person educated: Patient Education method: Explanation Education comprehension: verbalized understanding     HOME EXERCISE PROGRAM: Access Code: [email or web address] URL: [email or web address] Date: 03/17/2022 Prepared by: Valentina Gu  Exercises - Supine Active Straight Leg Raise  - 2 x daily - 7 x weekly - 2 sets - 10 reps - Supine Bridge  - 2 x daily - 7 x weekly - 2 sets - 10 reps - Standing Anterior Toe Taps  - 2 x daily - 7 x weekly - 2 sets - 10 reps - Standing 3-Way Kick  - 2 x daily - 7 x weekly - 2 sets - 10 reps - Side Stepping with Counter Support  - 2 x daily - 7 x  weekly - 2 sets - 10 reps - Mini Squat with Counter Support  - 2 x daily - 7 x weekly - 2 sets - 10 reps     ASSESSMENT:   CLINICAL IMPRESSION: Patient had fatigue with 3-way hip and total gym. When using the total gym, moderate cueing was required for proper mechanics. Pt has difficulty with maintaining consistent balance with tandem stance on Airex with pain in ankle. Pt progressed to deeper chair for simulation of home couch sit-to-stand and had difficulty but was able to accomplish two sets. Patient has ongoing LE weakness and difficulty using stairs with a single limb hold. Pt will continue to benefit from skilled PT services to address deficits and improve function.     GOALS: Goals reviewed with patient? No   SHORT TERM GOALS: Target date: 04/15/22   Pt will be independent with HEP in order to improve strength and balance in order to decrease fall risk and improve function at home. Baseline: pt HEP was reviewed for carryover effect 03/15/22 Goal status: INITIAL     LONG TERM GOALS: Target date: 05/13/22   Pt will increase FOTO to at least 61 to demonstrate significant improvement in function at home related to balance and pain. Baseline: 60 (FOTO adjusted  score of 46) Goal status: INITIAL   2.  Pt will increase in R knee extension MMT strength for ability to tolerate standing/walking demands needed for negotiating community ambulation. Baseline: 3*/5 03/15/22 Goal status: INITIAL   3.  Pt will increase ability to perform 5TSTS to demonstrate ability to transfer to and from commode with less difficulty.  Baseline: unable to accomplish 5TSTS without arm rests 03/15/22 Goal status: INITIAL   4.  Pt will be able to independently perform climbing a flight of stairs with unilateral UE or less support and reciprocal pattern to demonstrate increased ability to navigate home staircase.  Baseline: heavy upper extremity reliance for stair negotiation and having to perform step-to with her home's stair height.  Goal status: INITIAL     PLAN: PT FREQUENCY: 2x/week   PT DURATION: 12 weeks   PLANNED INTERVENTIONS: Therapeutic exercises, Therapeutic activity, Neuromuscular re-education, Balance training, Gait training, Patient/Family education, Joint manipulation, Joint mobilization, Canalith repositioning, Aquatic Therapy, Dry Needling, Cognitive remediation, Electrical stimulation, Spinal manipulation, Spinal mobilization, Cryotherapy, Moist heat, Traction, Ultrasound, Ionotophoresis 4mg /ml Dexamethasone, and Manual therapy   PLAN FOR NEXT SESSION: perform STM for decreased short term pain relief, abduction and quadriceps strengthening, walking with negotiating obstacles, sit-to-stand with Airex on chair for increased height.      Vonna Drafts, SPT 03/29/2022  12:42 PM

## 2022-03-31 ENCOUNTER — Encounter: Payer: Self-pay | Admitting: Physical Therapy

## 2022-03-31 ENCOUNTER — Ambulatory Visit: Payer: Medicaid Other | Attending: Student | Admitting: Physical Therapy

## 2022-03-31 DIAGNOSIS — R262 Difficulty in walking, not elsewhere classified: Secondary | ICD-10-CM | POA: Diagnosis present

## 2022-03-31 DIAGNOSIS — M6281 Muscle weakness (generalized): Secondary | ICD-10-CM | POA: Insufficient documentation

## 2022-03-31 NOTE — Therapy (Signed)
OUTPATIENT PHYSICAL THERAPY TREATMENT NOTE   Patient Name: Kristin Lopez MRN: [id number] DOB:September 15, 1965, 57 y.o., female Today's Date: 03/31/2022   END OF SESSION:   PT End of Session - 03/31/22 0856     Visit Number 6    Number of Visits 17    Date for PT Re-Evaluation 04/22/22    PT Start Time 0800    PT Stop Time 0846    PT Time Calculation (min) 46 min    Activity Tolerance Patient tolerated treatment well    Behavior During Therapy WFL for tasks assessed/performed             PCP: none on file  REFERRING PROVIDER: Haddix, Gillie Manners, MD   REFERRING DIAGNOSIS: S72.302K (ICD-10-CM) - Unspecified fracture of shaft of left femur, subsequent encounter for closed fracture with nonunion     Past Medical History:  Diagnosis Date   Hyperlipidemia    Past Surgical History:  Procedure Laterality Date   ABDOMINAL HYSTERECTOMY  2021   FEMUR FRACTURE SURGERY     FEMUR IM NAIL Left 02/03/2021   Procedure: INTRAMEDULLARY (IM) RETROGRADE FEMORAL EXCHANGE NAILING;  Surgeon: Roby Lofts, MD;  Location: [facility];  Service: Orthopedics;  Laterality: Left;   FEMUR IM NAIL Right 06/30/2021   Procedure: EXCHANGE NAILING FEMUR NONUNION;  Surgeon: Roby Lofts, MD;  Location: [facility];  Service: Orthopedics;  Laterality: Right;   HARDWARE REMOVAL Left 02/03/2021   Procedure: HARDWARE REMOVAL;  Surgeon: Roby Lofts, MD;  Location: [facility];  Service: Orthopedics;  Laterality: Left;   KNEE SURGERY     SUBMANDIBULAR GLAND EXCISION  1982   Patient Active Problem List   Diagnosis Date Noted   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 Getting up from a chair and couch is hard without pushing off from arm rest or legs. (Independent sit to stand goal). Can't sleep on right side, because hardware in distal femur is uncomfortable in this position. Some buckling but less recently. Still can't get on her knees for those type of chores. Staying in one position for long periods of time causes them pain and after a long day of being on their feet causes more pain and fatigue. R LE tends to always be numb and has some tingling but less than previous visit.      Pain: Yes  R/L thigh/knee: Now/Best 3/10, Worst 5/10    R Ankle: Now/Best 0/10, Worst 7/10 (tends to improve with repeated motion) Numbness/Tingling: Yes Recent changes in overall health/medication: No Prior history of physical therapy for present condition: Yes, previous episode of care s/p R femoral exchange nailing Falls: Has patient fallen in last 6 months? Yes, Number of falls: 2 Directional pattern for falls: No   Imaging: Yes ,   X-ray 06/30/21:  IMPRESSION: Internal fixation of incompletely healed mid femoral shaft fracture, which is in near anatomic alignment.  Prior level of function: Independent and Independent with household mobility without device Occupational demands: -    Precautions: None   Weight Bearing Restrictions: No   Living Environment Lives with: lives with their family Lives in: House/apartment ; Pt reports having13 steps and uses railing. Has following equipment at home: Single point cane, grab bars in the shower, and raised commode over the toilet. Some uneven surfaces getting into home that she has to pay attention to decrease occurrence of falls.      Patient Goals: Patient  would like to be able to run and exercise, move more, and be able to lift more than 40 lbs. Pt would ike to get back to dancing with a little more ease.    PRECAUTIONS: none    SUBJECTIVE:                                                                                                                                                                                      SUBJECTIVE STATEMENT: Pt states that she has been doing her HEP at home and currently feels sore from that. Pt reports some pain but is in her normal range at 3/10. Pt reports there have been no major changes. Pt reports that she will be moving into a new home come March and new home only has a few steps into the single level home.    PAIN:  Are you having pain? No   OBJECTIVE: (objective measures completed at initial evaluation unless otherwise dated)   Patient Surveys  FOTO: Pt reported 60 (FOTO risk adjusted 46),predicted improvement to 70 ABC: deferred     Cognition Patient is oriented to person, place, and time.  Recent memory is intact.  Remote memory is intact.  Attention span and concentration are intact.  Expressive speech is intact.  Patient's fund of knowledge is within normal limits for educational level.                            Gross Musculoskeletal Assessment Tremor: None Bulk: Normal Tone: Normal     GAIT: Distance walked: deferred Assistive device utilized: Single point cane used for longer distances Level of assistance: Complete Independence Comments: lateral trunk sway, decreased step length, decreased R knee flexion in swing phase.     Posture: No gross abnormalities noted in standing or seated posture     AROM       AROM (Normal range in degrees) AROM  03/15/2022         Hip Right Left  Flexion (125) 100 110  Extension (15)      Abduction (40)  Adduction       Internal Rotation (45)      External Rotation (45)             Knee      Flexion (135) 135* WNL   Extension (0) +3 +3         Ankle      Dorsiflexion (20)      Plantarflexion (50)      Inversion (35)      Eversion (15      (* = pain; Blank rows = not tested)     PROM Hip flexion: R WNL, L WNL  Knee PROM WNL, pain with end-range R knee flexion      LE MMT:   MMT (out of 5) Right 03/15/2022 Left 03/15/2022  Hip flexion 4 4  Hip extension      Hip abduction 3 3  Hip adduction (seated) 5 5  Hip internal rotation 3 3  Hip external rotation 5 5    Knee flexion 5 5  Knee extension 3* 5  Ankle dorsiflexion 5 5  Ankle plantarflexion      Ankle inversion 4 5  Ankle eversion      (* = pain; Blank rows = not tested)     Sensation Deferred     Reflexes Deferred       FUNCTIONAL OUTCOME MEASURES     Results Comments  TUG 8.66 seconds    5TSTS Deferred due to deficit in sit to stand performance    (Blank rows = not tested)    TODAY'S TREATMENT     Therapeutic Exercise - improved strength as needed to improve performance of CKC activities/functional movements, for improved ability to perform stair negotiation and transferring  - NuStep Seat 10 handles 8 Resistance 3 for 5 minutes - for soft tissue warm-up to improve muscle performance and improve mobility   -subjective information gathered during this time    - Sit-to-stand from chair 3x12  - Tandem stance on Airex: 4x1 minute (RLE behind and then switched to LLE)  - Banded lateral stepping with Red Theraband on blue ladder 5 D/B length of the agility ladder   - Total gym 3x10  - moderate cueing on keeping heels on the platform and widening stance     *not today* - Step-ups on 6 inch step with 3 sec hold on single limb 3x12 bilateral  - used unilateral UE support and minimal cueing on holding single limb stance - 3-way hip exercise with 4lbs. 2x12 bilateral  - used unilateral UE support with minimal cueing for slow and controlled movements  PATIENT EDUCATION:  Education details: Pt was educated on  use of HEP for strength gains and increased ambulation. Discussed holding on bridges at this time and using other alternatives for gluteal/posterior chain strengthening.  Person educated: Patient Education method: Explanation Education comprehension: verbalized understanding     HOME EXERCISE PROGRAM: Access Code: [email or web address] URL: [email or web address] Date: 03/17/2022 Prepared by: Valentina Gu  Exercises - Supine Active Straight Leg Raise  - 2 x daily - 7 x weekly - 2 sets - 10 reps - Supine Bridge  - 2 x daily - 7 x weekly - 2 sets - 10 reps - Standing Anterior Toe Taps  - 2 x daily - 7 x weekly - 2 sets - 10 reps - Standing 3-Way Kick  - 2 x daily - 7 x weekly - 2 sets - 10 reps - Side Stepping with Counter Support  - 2  x daily - 7 x weekly - 2 sets - 10 reps - Mini Squat with Counter Support  - 2 x daily - 7 x weekly - 2 sets - 10 reps     ASSESSMENT:   CLINICAL IMPRESSION: Patient had fatigue coming into the clinic today and did sit-to-stand with Airex due to the fatigue. When using the total gym, minimal cueing was required for proper mechanics. Pt performed better with maintaining consistent balance with tandem stance on Airex with pain in ankle and had minimal touches on the // bars for balance corrections. Pt progressed to deeper chair for simulation of home couch sit-to-stand and had difficulty but was able to accomplish two sets. Patient has ongoing LE weakness and difficulty using stairs with a single limb hold. Pt will continue to benefit from skilled PT services to address deficits and improve function.     GOALS: Goals reviewed with patient? No   SHORT TERM GOALS: Target date: 04/15/22   Pt will be independent with HEP in order to improve strength and balance in order to decrease fall risk and improve function at home. Baseline: pt HEP was reviewed for carryover effect 03/15/22 Goal status: INITIAL     LONG TERM GOALS: Target date: 05/13/22   Pt will  increase FOTO to at least 61 to demonstrate significant improvement in function at home related to balance and pain. Baseline: 60 (FOTO adjusted score of 46) Goal status: INITIAL   2.  Pt will increase in R knee extension MMT strength for ability to tolerate standing/walking demands needed for negotiating community ambulation. Baseline: 3*/5 03/15/22 Goal status: INITIAL   3.  Pt will increase ability to perform 5TSTS to demonstrate ability to transfer to and from commode with less difficulty.  Baseline: unable to accomplish 5TSTS without arm rests 03/15/22 Goal status: INITIAL   4.  Pt will be able to independently perform climbing a flight of stairs with unilateral UE or less support and reciprocal pattern to demonstrate increased ability to navigate home staircase.  Baseline: heavy upper extremity reliance for stair negotiation and having to perform step-to with her home's stair height.  Goal status: INITIAL     PLAN: PT FREQUENCY: 2x/week   PT DURATION: 12 weeks   PLANNED INTERVENTIONS: Therapeutic exercises, Therapeutic activity, Neuromuscular re-education, Balance training, Gait training, Patient/Family education, Joint manipulation, Joint mobilization, Canalith repositioning, Aquatic Therapy, Dry Needling, Cognitive remediation, Electrical stimulation, Spinal manipulation, Spinal mobilization, Cryotherapy, Moist heat, Traction, Ultrasound, Ionotophoresis 4mg /ml Dexamethasone, and Manual therapy   PLAN FOR NEXT SESSION: perform STM for decreased short term pain relief, abduction and quadriceps strengthening, walking with negotiating obstacles, sit-to-stand with Airex on chair for increased height.      Vonna Drafts, SPT 03/31/2022  3:54 PM

## 2022-04-05 ENCOUNTER — Ambulatory Visit: Payer: Medicaid Other | Admitting: Physical Therapy

## 2022-04-05 ENCOUNTER — Encounter: Payer: Self-pay | Admitting: Physical Therapy

## 2022-04-05 DIAGNOSIS — R262 Difficulty in walking, not elsewhere classified: Secondary | ICD-10-CM | POA: Diagnosis not present

## 2022-04-05 DIAGNOSIS — M6281 Muscle weakness (generalized): Secondary | ICD-10-CM

## 2022-04-05 NOTE — Therapy (Signed)
OUTPATIENT PHYSICAL THERAPY TREATMENT NOTE   Patient Name: Kristin Lopez MRN: [id number] DOB:[birth year], 57 y.o., female Today's Date: 04/05/2022   END OF SESSION:   PT End of Session - 04/05/22 0920     Visit Number 7    Number of Visits 17    Date for PT Re-Evaluation 04/22/22    PT Start Time 0800    PT Stop Time 0845    PT Time Calculation (min) 45 min    Activity Tolerance Patient tolerated treatment well    Behavior During Therapy WFL for tasks assessed/performed              PCP: none on file  REFERRING PROVIDER: Haddix, Thomasene Lot, MD   REFERRING DIAGNOSIS: S72.302K (ICD-10-CM) - Unspecified fracture of shaft of left femur, subsequent encounter for closed fracture with nonunion     Past Medical History:  Diagnosis Date   Hyperlipidemia    Past Surgical History:  Procedure Laterality Date   ABDOMINAL HYSTERECTOMY  2021   FEMUR FRACTURE SURGERY     FEMUR IM NAIL Left 02/03/2021   Procedure: INTRAMEDULLARY (IM) RETROGRADE FEMORAL EXCHANGE NAILING;  Surgeon: Shona Needles, MD;  Location: [place];  Service: Orthopedics;  Laterality: Left;   FEMUR IM NAIL Right 06/30/2021   Procedure: EXCHANGE NAILING FEMUR NONUNION;  Surgeon: Shona Needles, MD;  Location: [place];  Service: Orthopedics;  Laterality: Right;   HARDWARE REMOVAL Left 02/03/2021   Procedure: HARDWARE REMOVAL;  Surgeon: Shona Needles, MD;  Location: [place];  Service: Orthopedics;  Laterality: Left;   KNEE SURGERY     SUBMANDIBULAR GLAND EXCISION  1982   Patient Active Problem List   Diagnosis Date Noted   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 Getting up from a chair and couch is hard without pushing off from arm rest or legs. (Independent sit to stand goal). Can't sleep on right side, because hardware in distal femur is uncomfortable in this position. Some buckling but less recently. Still can't get on her knees for those type of chores. Staying in one position for long periods of time causes them pain and after a long day of being on their feet causes more pain and fatigue. R LE tends to always be numb and has some tingling but less than previous visit.      Pain: Yes  R/L thigh/knee: Now/Best 3/10, Worst 5/10    R Ankle: Now/Best 0/10, Worst 7/10 (tends to improve with repeated motion) Numbness/Tingling: Yes Recent changes in overall health/medication: No Prior history of physical therapy for present condition: Yes, previous episode of care s/p R femoral exchange nailing Falls: Has patient fallen in last 6 months? Yes, Number of falls: 2 Directional pattern for falls: No   Imaging: Yes ,   X-ray 06/30/21:  IMPRESSION: Internal fixation of incompletely healed mid femoral shaft fracture, which is in near anatomic alignment.  Prior level of function: Independent and Independent with household mobility without device Occupational demands: -    Precautions: None   Weight Bearing Restrictions: No   Living Environment Lives with: lives with their family Lives in: House/apartment ; Pt reports having13 steps and uses railing. Has following equipment at home: Single point cane, grab bars in the shower, and raised commode over the toilet. Some uneven surfaces getting into home that she has to pay attention to decrease occurrence of falls.      Patient  Goals: Patient would like to be able to run and exercise, move more, and be able to lift more than 40 lbs. Pt would ike to get back to dancing with a little more ease.    PRECAUTIONS: none    SUBJECTIVE:                                                                                                                                                                                      SUBJECTIVE STATEMENT: Patient reported that she felt sore the day after her visit last week and took a day off from activity the next day then resumed HEP on Sunday. Pt states that yesterday she did a lot of walking to the grocery store across the street from her home. Pt states that she always brings her cane with her and the farthest she has walked without her cane is to her mailbox with no incidence. Pt reports that she will be moving into a new home come March and new home only has a few steps into the single level home. Pt reports that she has an appointment with her doctor today after her visit with PT.     PAIN:  Are you having pain? No   OBJECTIVE: (objective measures completed at initial evaluation unless otherwise dated)   Patient Surveys  FOTO: Pt reported 55 (FOTO risk adjusted 46),predicted improvement to 69 ABC: deferred     Cognition Patient is oriented to person, place, and time.  Recent memory is intact.  Remote memory is intact.  Attention span and concentration are intact.  Expressive speech is intact.  Patient's fund of knowledge is within normal limits for educational level.                            Gross Musculoskeletal Assessment Tremor: None Bulk: Normal Tone: Normal     GAIT: Distance walked: deferred Assistive device utilized: Single point cane used for longer distances Level of assistance: Complete Independence Comments: lateral trunk sway, decreased step length, decreased R knee flexion in swing phase.     Posture: No  gross abnormalities noted in standing or  seated posture     AROM       AROM (Normal range in degrees) AROM  03/15/2022         Hip Right Left  Flexion (125) 100 110  Extension (15)      Abduction (40)      Adduction       Internal Rotation (45)      External Rotation (45)             Knee      Flexion (135) 135* WNL  Extension (0) +3 +3         Ankle      Dorsiflexion (20)      Plantarflexion (50)      Inversion (35)      Eversion (15      (* = pain; Blank rows = not tested)     PROM Hip flexion: R WNL, L WNL  Knee PROM WNL, pain with end-range R knee flexion      LE MMT:   MMT (out of 5) Right 03/15/2022 Left 03/15/2022  Hip flexion 4 4  Hip extension      Hip abduction 3 3  Hip adduction (seated) 5 5  Hip internal rotation 3 3  Hip external rotation 5 5    Knee flexion 5 5  Knee extension 3* 5  Ankle dorsiflexion 5 5  Ankle plantarflexion      Ankle inversion 4 5  Ankle eversion      (* = pain; Blank rows = not tested)     Sensation Deferred     Reflexes Deferred       FUNCTIONAL OUTCOME MEASURES     Results Comments  TUG 8.66 seconds    5TSTS Deferred due to deficit in sit to stand performance    (Blank rows = not tested)    TODAY'S TREATMENT     Therapeutic Exercise - improved strength as needed to improve performance of CKC activities/functional movements, for improved ability to perform stair negotiation and transferring  - NuStep Seat 10 handles 8 Resistance 4 for 5 minutes - for soft tissue warm-up to improve muscle performance and improve mobility   -subjective information gathered during this time   - Sit-to-stand from chair 3x12  - Step-ups on 6 inch step with contralateral march; 2x10 bilateral  - used unilateral UE support and minimal cueing on holding single limb stance  - 3-way hip exercise with 4lbs. 3x12 bilateral  - used unilateral UE support with minimal cueing for slow and controlled movements  - Total gym double-limb squat; 3x10  - moderate cueing on  keeping heels on the platform and widening stance  - Bosu forward mini lunge, adjacent to treadmill armrest as needed for balance; 2x10 bilateral   *not today* - Tandem stance on Airex: 4x1 minute (RLE behind and then switched to LLE)  - Banded lateral stepping with Red Theraband on blue ladder 5 D/B length of the agility ladder     PATIENT EDUCATION:  Education details: Pt was educated on use of HEP for strength gains and increased ambulation. Discussed holding on bridges at this time and using other alternatives for gluteal/posterior chain strengthening.  Person educated: Patient Education method: Explanation Education comprehension: verbalized understanding     HOME EXERCISE PROGRAM: Access Code: [email or web address] URL: [email or web address] Date: 03/17/2022 Prepared by: Valentina Gu  Exercises - Supine Active Straight Leg Raise  - 2 x daily - 7 x weekly -  2 sets - 10 reps - Supine Bridge  - 2 x daily - 7 x weekly - 2 sets - 10 reps - Standing Anterior Toe Taps  - 2 x daily - 7 x weekly - 2 sets - 10 reps - Standing 3-Way Kick  - 2 x daily - 7 x weekly - 2 sets - 10 reps - Side Stepping with Counter Support  - 2 x daily - 7 x weekly - 2 sets - 10 reps - Mini Squat with Counter Support  - 2 x daily - 7 x weekly - 2 sets - 10 reps     ASSESSMENT:   CLINICAL IMPRESSION: Patient has progressed to a deeper chair height for sit-to-stand with no Airex pad with moderate difficulty and was able to achieve multiple repetitions with no pushing from armrests or her legs. Patient performed well with stepping onto 6 inch stair with contralateral march and did not show fatigue. Pt continues to show difficulty and fatigue with 3-way hip exercise with single limb stance. Pt demonstrated mini lunge well with minimal cueing for appropriate depth and technique. Patient has ongoing LE weakness with a single limb hold. Pt will continue to benefit from skilled PT services to address deficits  and improve function.     GOALS: Goals reviewed with patient? No   SHORT TERM GOALS: Target date: 04/15/22   Pt will be independent with HEP in order to improve strength and balance in order to decrease fall risk and improve function at home. Baseline: pt HEP was reviewed for carryover effect 03/15/22 Goal status: INITIAL     LONG TERM GOALS: Target date: 05/13/22   Pt will increase FOTO to at least 61 to demonstrate significant improvement in function at home related to balance and pain. Baseline: 03/15/22: 60 (FOTO adjusted score of 46) Goal status: INITIAL   2.  Pt will increase in R knee extension MMT strength for ability to tolerate standing/walking demands needed for negotiating community ambulation. Baseline: 03/15/22: 3*/5  Goal status: INITIAL   3.  Pt will increase ability to perform 5TSTS to demonstrate ability to transfer to and from commode with less difficulty.  Baseline: 03/15/22: Unable to accomplish 5TSTS without arm rests Goal status: INITIAL   4.  Pt will be able to independently perform climbing a flight of stairs with unilateral UE or less support and reciprocal pattern to demonstrate increased ability to navigate home staircase.  Baseline: 03/15/22: heavy upper extremity reliance for stair negotiation and having to perform step-to with her home's stair height.  Goal status: INITIAL     PLAN: PT FREQUENCY: 2x/week   PT DURATION: 12 weeks   PLANNED INTERVENTIONS: Therapeutic exercises, Therapeutic activity, Neuromuscular re-education, Balance training, Gait training, Patient/Family education, Joint manipulation, Joint mobilization, Canalith repositioning, Aquatic Therapy, Dry Needling, Cognitive remediation, Electrical stimulation, Spinal manipulation, Spinal mobilization, Cryotherapy, Moist heat, Traction, Ultrasound, Ionotophoresis 4mg /ml Dexamethasone, and Manual therapy   PLAN FOR NEXT SESSION: Abduction and quadriceps strengthening, walking with negotiating  obstacles, sit-to-stand, internal rotation exercise, and update HEP for increase in difficulty appropriate to progress.      , SPT 04/05/2022  11:06 AM

## 2022-04-07 ENCOUNTER — Ambulatory Visit: Payer: Medicaid Other | Admitting: Physical Therapy

## 2022-04-07 DIAGNOSIS — R262 Difficulty in walking, not elsewhere classified: Secondary | ICD-10-CM | POA: Diagnosis not present

## 2022-04-07 DIAGNOSIS — M6281 Muscle weakness (generalized): Secondary | ICD-10-CM

## 2022-04-07 NOTE — Therapy (Signed)
OUTPATIENT PHYSICAL THERAPY TREATMENT NOTE   Patient Name: Kristin Lopez MRN: [id number] DOB:[birth year], 57 y.o., female Today's Date: 04/07/2022   END OF SESSION:   PT End of Session - 04/07/22 1409     Visit Number 8    Number of Visits 17    Date for PT Re-Evaluation 04/22/22    PT Start Time 0800    PT Stop Time 0845    PT Time Calculation (min) 45 min    Activity Tolerance Patient tolerated treatment well    Behavior During Therapy WFL for tasks assessed/performed               PCP: none on file  REFERRING PROVIDER: Haddix, Thomasene Lot, MD   REFERRING DIAGNOSIS: S72.302K (ICD-10-CM) - Unspecified fracture of shaft of left femur, subsequent encounter for closed fracture with nonunion     Past Medical History:  Diagnosis Date   Hyperlipidemia    Past Surgical History:  Procedure Laterality Date   ABDOMINAL HYSTERECTOMY  2021   FEMUR FRACTURE SURGERY     FEMUR IM NAIL Left 02/03/2021   Procedure: INTRAMEDULLARY (IM) RETROGRADE FEMORAL EXCHANGE NAILING;  Surgeon: Shona Needles, MD;  Location: [place];  Service: Orthopedics;  Laterality: Left;   FEMUR IM NAIL Right 06/30/2021   Procedure: EXCHANGE NAILING FEMUR NONUNION;  Surgeon: Shona Needles, MD;  Location: [place];  Service: Orthopedics;  Laterality: Right;   HARDWARE REMOVAL Left 02/03/2021   Procedure: HARDWARE REMOVAL;  Surgeon: Shona Needles, MD;  Location: [place];  Service: Orthopedics;  Laterality: Left;   KNEE SURGERY     SUBMANDIBULAR GLAND EXCISION  1982   Patient Active Problem List   Diagnosis Date Noted   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 Getting up from a chair and couch is hard without pushing off from arm rest or legs. (Independent sit to stand goal). Can't sleep on right side, because hardware in distal femur is uncomfortable in this position. Some buckling but less recently. Still can't get on her knees for those type of chores. Staying in one position for long periods of time causes them pain and after a long day of being on their feet causes more pain and fatigue. R LE tends to always be numb and has some tingling but less than previous visit.      Pain: Yes  R/L thigh/knee: Now/Best 3/10, Worst 5/10    R Ankle: Now/Best 0/10, Worst 7/10 (tends to improve with repeated motion) Numbness/Tingling: Yes Recent changes in overall health/medication: No Prior history of physical therapy for present condition: Yes, previous episode of care s/p R femoral exchange nailing Falls: Has patient fallen in last 6 months? Yes, Number of falls: 2 Directional pattern for falls: No   Imaging: Yes ,   X-ray 06/30/21:  IMPRESSION: Internal fixation of incompletely healed mid femoral shaft fracture, which is in near anatomic alignment.  Prior level of function: Independent and Independent with household mobility without device Occupational demands: -    Precautions: None   Weight Bearing Restrictions: No   Living Environment Lives with: lives with their family Lives in: House/apartment ; Pt reports having13 steps and uses railing. Has following equipment at home: Single point cane, grab bars in the shower, and raised commode over the toilet. Some uneven surfaces getting into home that she has to pay attention to decrease occurrence of falls.      Patient  Goals: Patient would like to be able to run and exercise, move more, and be able to lift more than 40 lbs. Pt would ike to get back to dancing with a little more ease.    PRECAUTIONS: none    SUBJECTIVE:                                                                                                                                                                                      SUBJECTIVE STATEMENT: Patient reported that the doctor informed patient that fracture is well healed per follow-up radiographs. Pt reported feeling tired today due to inconsistent sleep the past couple of nights. Pt reported that she also felt sore the day after her visit last week and took a day off from activity the next day. Pt states that she tried to do a little jump at home and felt less pain in her legs than the last time she tried to jump a month ago. Pt reports that she will be moving into a new home come March and new home only has a few steps into the single level home. Pt states difficulty with going on to her knees when trying to clean and reach under the bed.     PAIN:  Are you having pain? No   OBJECTIVE: (objective measures completed at initial evaluation unless otherwise dated)   Patient Surveys  FOTO: Pt reported 60 (FOTO risk adjusted 46),predicted improvement to 95 ABC: deferred     Cognition Patient is oriented to person, place, and time.  Recent memory is intact.  Remote memory is intact.  Attention span and concentration are intact.  Expressive speech is intact.  Patient's fund of knowledge is within normal limits for educational level.                            Gross Musculoskeletal Assessment Tremor: None Bulk: Normal Tone: Normal     GAIT: Distance walked: deferred Assistive device utilized: Single point cane used for longer distances Level of assistance: Complete Independence Comments: lateral trunk sway, decreased step length, decreased R  knee flexion in swing  phase.     Posture: No gross abnormalities noted in standing or seated posture     AROM       AROM (Normal range in degrees) AROM  03/15/2022         Hip Right Left  Flexion (125) 100 110  Extension (15)      Abduction (40)      Adduction       Internal Rotation (45)      External Rotation (45)             Knee      Flexion (135) 135* WNL  Extension (0) +3 +3         Ankle      Dorsiflexion (20)      Plantarflexion (50)      Inversion (35)      Eversion (15      (* = pain; Blank rows = not tested)     PROM Hip flexion: R WNL, L WNL  Knee PROM WNL, pain with end-range R knee flexion      LE MMT:   MMT (out of 5) Right 03/15/2022 Left 03/15/2022  Hip flexion 4 4  Hip extension      Hip abduction 3 3  Hip adduction (seated) 5 5  Hip internal rotation 3 3  Hip external rotation 5 5    Knee flexion 5 5  Knee extension 3* 5  Ankle dorsiflexion 5 5  Ankle plantarflexion      Ankle inversion 4 5  Ankle eversion      (* = pain; Blank rows = not tested)     Sensation Deferred     Reflexes Deferred       FUNCTIONAL OUTCOME MEASURES     Results Comments  TUG 8.66 seconds    5TSTS Deferred due to deficit in sit to stand performance    (Blank rows = not tested)    TODAY'S TREATMENT     Therapeutic Exercise - improved strength as needed to improve performance of CKC activities/functional movements, for improved ability to perform stair negotiation and transferring  - NuStep Seat 10 handles 8 Resistance 4 for 5 minutes - for soft tissue warm-up to improve muscle performance and improve mobility   -subjective information gathered during this time   - Sit-to-stand from chair; 3x8  - limited by fatigue and ache in knees  - Banded lateral stepping with Red Theraband on agility ladder 2 D/B length of the agility ladder  - limited by fatigue and ache in knees  - side-lying hip abduction; 2x10 bilateral  - prone hip extension; 2x10 bilateral  -  reverse clamshells; 2x10 bilateral  - Total gym double-limb squat; 2x10  - moderate cueing on keeping heels on the platform and widening stance  - forward mini lunge, adjacent to treadmill armrest as needed for balance; 3x10    *not today* - Tandem stance on Airex: 4x1 minute (RLE behind and then switched to LLE) - Step-ups on 6 inch step with contralateral march; 2x10 bilateral  - used unilateral UE support and minimal cueing on holding single limb stance - 3-way hip exercise with 4lbs. 3x12 bilateral  - used unilateral UE support with minimal cueing for slow and controlled movements   PATIENT EDUCATION:  Education details: Pt educated on appropriate level of fatigue and pain when performing activities at home and preparing for their move. Discussed current healing timeline for ORIFs. Person educated: Patient Education method:  Explanation Education comprehension: verbalized understanding     HOME EXERCISE PROGRAM: Access Code: [email or web address] URL: [email or web address] Date: 03/17/2022 Prepared by: Valentina Gu  Exercises - Supine Active Straight Leg Raise  - 2 x daily - 7 x weekly - 2 sets - 10 reps - Supine Bridge  - 2 x daily - 7 x weekly - 2 sets - 10 reps - Standing Anterior Toe Taps  - 2 x daily - 7 x weekly - 2 sets - 10 reps - Standing 3-Way Kick  - 2 x daily - 7 x weekly - 2 sets - 10 reps - Side Stepping with Counter Support  - 2 x daily - 7 x weekly - 2 sets - 10 reps - Mini Squat with Counter Support  - 2 x daily - 7 x weekly - 2 sets - 10 reps     ASSESSMENT:   CLINICAL IMPRESSION: Patient had more difficulty with deeper sit-to-stand due to fatigue and was able to complete 3 sets of 8 with difficulty. Pt had an increase in effort with more weight bearing activities so plan was adjusted for prone and side-lying positions to decrease pressure on knees. Patient has ongoing LE weakness with a single limb hold and narrowed base of support positions. Pt will  continue to benefit from skilled PT services to address deficits and improve function.     GOALS: Goals reviewed with patient? No   SHORT TERM GOALS: Target date: 04/15/22   Pt will be independent with HEP in order to improve strength and balance in order to decrease fall risk and improve function at home. Baseline: pt HEP was reviewed for carryover effect 03/15/22 Goal status: INITIAL     LONG TERM GOALS: Target date: 05/13/22   Pt will increase FOTO to at least 61 to demonstrate significant improvement in function at home related to balance and pain. Baseline: 03/15/22: 60 (FOTO adjusted score of 46) Goal status: INITIAL   2.  Pt will increase in R knee extension MMT strength for ability to tolerate standing/walking demands needed for negotiating community ambulation. Baseline: 03/15/22: 3*/5  Goal status: INITIAL   3.  Pt will increase ability to perform 5TSTS to demonstrate ability to transfer to and from commode with less difficulty.  Baseline: 03/15/22: Unable to accomplish 5TSTS without arm rests Goal status: INITIAL   4.  Pt will be able to independently perform climbing a flight of stairs with unilateral UE or less support and reciprocal pattern to demonstrate increased ability to navigate home staircase.  Baseline: 03/15/22: heavy upper extremity reliance for stair negotiation and having to perform step-to with her home's stair height.  Goal status: INITIAL     PLAN: PT FREQUENCY: 2x/week   PT DURATION: 12 weeks   PLANNED INTERVENTIONS: Therapeutic exercises, Therapeutic activity, Neuromuscular re-education, Balance training, Gait training, Patient/Family education, Joint manipulation, Joint mobilization, Canalith repositioning, Aquatic Therapy, Dry Needling, Cognitive remediation, Electrical stimulation, Spinal manipulation, Spinal mobilization, Cryotherapy, Moist heat, Traction, Ultrasound, Ionotophoresis 4mg /ml Dexamethasone, and Manual therapy   PLAN FOR NEXT SESSION:  Abduction and quadriceps strengthening, walking with negotiating obstacles, sit-to-stand, internal rotation exercise, and update HEP for increase in difficulty appropriate to progress.      Vonna Drafts, SPT 04/07/2022  2:11 PM

## 2022-04-12 ENCOUNTER — Encounter: Payer: Self-pay | Admitting: Physical Therapy

## 2022-04-12 ENCOUNTER — Ambulatory Visit: Payer: Medicaid Other | Admitting: Physical Therapy

## 2022-04-12 DIAGNOSIS — R262 Difficulty in walking, not elsewhere classified: Secondary | ICD-10-CM

## 2022-04-12 DIAGNOSIS — M6281 Muscle weakness (generalized): Secondary | ICD-10-CM

## 2022-04-12 NOTE — Therapy (Signed)
OUTPATIENT PHYSICAL THERAPY TREATMENT NOTE   Patient Name: Kristin Lopez MRN: [id number] DOB:January 28, 1966, 57 y.o., female Today's Date: 04/12/2022   END OF SESSION:   PT End of Session - 04/12/22 0905     Visit Number 9    Number of Visits 17    Date for PT Re-Evaluation 04/22/22    PT Start Time 0813    PT Stop Time 0851    PT Time Calculation (min) 38 min    Activity Tolerance Patient tolerated treatment well    Behavior During Therapy WFL for tasks assessed/performed               PCP: none on file  REFERRING PROVIDER: Shona Needles, MD   REFERRING DIAGNOSIS: S72.302K (ICD-10-CM) - Unspecified fracture of shaft of left femur, subsequent encounter for closed fracture with nonunion     Past Medical History:  Diagnosis Date   Hyperlipidemia    Past Surgical History:  Procedure Laterality Date   ABDOMINAL HYSTERECTOMY  2021   FEMUR FRACTURE SURGERY     FEMUR IM NAIL Left 02/03/2021   Procedure: INTRAMEDULLARY (IM) RETROGRADE FEMORAL EXCHANGE NAILING;  Surgeon: Shona Needles, MD;  Location: [facility];  Service: Orthopedics;  Laterality: Left;   FEMUR IM NAIL Right 06/30/2021   Procedure: EXCHANGE NAILING FEMUR NONUNION;  Surgeon: Shona Needles, MD;  Location: [place];  Service: Orthopedics;  Laterality: Right;   HARDWARE REMOVAL Left 02/03/2021   Procedure: HARDWARE REMOVAL;  Surgeon: Shona Needles, MD;  Location: [place];  Service: Orthopedics;  Laterality: Left;   KNEE SURGERY     SUBMANDIBULAR GLAND EXCISION  1982   Patient Active Problem List   Diagnosis Date Noted   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 Getting up from a chair and couch is hard without pushing off from arm rest or legs. (Independent sit to stand goal). Can't sleep on right side, because hardware in distal femur is uncomfortable in this position. Some buckling but less recently. Still can't get on her knees for those type of chores. Staying in one position for long periods of time causes them pain and after a long day of being on their feet causes more pain and fatigue. R LE tends to always be numb and has some tingling but less than previous visit.      Pain: Yes  R/L thigh/knee: Now/Best 3/10, Worst 5/10    R Ankle: Now/Best 0/10, Worst 7/10 (tends to improve with repeated motion) Numbness/Tingling: Yes Recent changes in overall health/medication: No Prior history of physical therapy for present condition: Yes, previous episode of care s/p R femoral exchange nailing Falls: Has patient fallen in last 6 months? Yes, Number of falls: 2 Directional pattern for falls: No   Imaging: Yes ,   X-ray 06/30/21:  IMPRESSION: Internal fixation of incompletely healed mid femoral shaft fracture, which is in near anatomic alignment.  Prior level of function: Independent and Independent with household mobility without device Occupational demands: -    Precautions: None   Weight Bearing Restrictions: No   Living Environment Lives with: lives with their family Lives in: House/apartment ; Pt reports having13 steps and uses railing. Has following equipment at home: Single point cane, grab bars in the shower, and raised commode over the toilet. Some uneven surfaces getting into home that she has to pay attention to decrease occurrence of falls.      Patient  Goals: Patient would like to be able to run and exercise, move more, and be able to lift more than 40 lbs. Pt would ike to get back to dancing with a little more ease.    PRECAUTIONS: none    SUBJECTIVE:                                                                                                                                                                                      SUBJECTIVE STATEMENT: Pt reports feeling tired today due to inconsistent sleep the past couple of nights. Pt report that they have been packing a lot at home for the upcoming move. Pt states that she also has been baby-sitting her nephew and is having chronic back pain that has limited her activity the past couple of days. Pt reports that she has a doctor appointment today for her procedure that she had on her nose.    PAIN:  Are you having pain? No   OBJECTIVE: (objective measures completed at initial evaluation unless otherwise dated)   Patient Surveys  FOTO: Pt reported 7 (FOTO risk adjusted 46),predicted improvement to 61 ABC: deferred     Cognition Patient is oriented to person, place, and time.  Recent memory is intact.  Remote memory is intact.  Attention span and concentration are intact.  Expressive speech is intact.  Patient's fund of knowledge is within normal limits for educational level.                            Gross Musculoskeletal Assessment Tremor: None Bulk: Normal Tone: Normal     GAIT: Distance walked: deferred Assistive device utilized: Single point cane used for longer distances Level of assistance: Complete Independence Comments: lateral trunk sway, decreased step length, decreased R knee flexion in swing phase.     Posture: No gross abnormalities noted in standing or seated posture     AROM       AROM (Normal range in degrees) AROM  03/15/2022         Hip Right Left  Flexion (125) 100 110  Extension (15)      Abduction (40)      Adduction        Internal Rotation (45)      External Rotation (45)             Knee      Flexion (135) 135* WNL  Extension (0) +3 +3         Ankle      Dorsiflexion (20)      Plantarflexion (50)      Inversion (35)      Eversion (15      (* = pain; Blank rows = not tested)     PROM Hip flexion: R WNL, L WNL  Knee PROM WNL, pain with end-range R knee flexion      LE MMT:   MMT (out of 5) Right 03/15/2022 Left 03/15/2022  Hip flexion 4 4  Hip extension      Hip abduction 3 3  Hip adduction (seated) 5 5  Hip internal rotation 3 3  Hip external rotation 5 5    Knee flexion 5 5  Knee extension 3* 5  Ankle dorsiflexion 5 5  Ankle plantarflexion      Ankle inversion 4 5  Ankle eversion      (* = pain; Blank rows = not tested)     Sensation Deferred     Reflexes Deferred       FUNCTIONAL OUTCOME MEASURES     Results Comments  TUG 8.66 seconds    5TSTS Deferred due to deficit in sit to stand performance    (Blank rows = not tested)    TODAY'S TREATMENT     Therapeutic Exercise - improved strength as needed to improve performance of CKC activities/functional movements, for improved ability to perform stair negotiation and transferring  - NuStep Seat 10 handles 8 Resistance 4 for 5 minutes - for soft tissue warm-up to improve muscle performance and improve mobility   -subjective information gathered during this time   - Sit-to-stand from chair; 3x10  - limited by fatigue and ache in knees  - Step-ups on 6 inch step and Airex pad; 2x10 bilateral  - used unilateral UE support and minimal cueing on holding single limb stance  - single limb stance hold;  2x10 with 10 sec hold      *not today* - Banded lateral stepping with Red Theraband on agility ladder 2 D/B length of the agility ladder  - limited by fatigue and ache in knees - Mini squat in pain free range; 1x10  - forward mini lunge, adjacent to treadmill armrest as needed for balance; 1x10  - 3-way hip exercise  with 4lbs. 3x12 bilateral - used unilateral UE support with minimal cueing for slow and controlled movements - side-lying hip abduction; 2x10 bilateral - prone hip extension; 2x10 bilateral - reverse clamshells; 2x10 bilateral - Total gym double-limb squat; 2x10  - moderate cueing on keeping heels on the platform and widening stance - Tandem stance on Airex: 4x1 minute (RLE behind and then switched to LLE)    PATIENT EDUCATION:  Education details: Pt educated on appropriate level of fatigue and pain when performing activities at home and preparing for their move. Discussed current healing timeline for ORIFs. Person educated: Patient Education method: Explanation Education comprehension: verbalized understanding     HOME EXERCISE PROGRAM: Access Code: [email or web address] URL: [email or web address] Date: 03/17/2022 Prepared by: Valentina Gu  Exercises - Supine Active Straight Leg Raise  - 2 x daily -  7 x weekly - 2 sets - 10 reps - Supine Bridge  - 2 x daily - 7 x weekly - 2 sets - 10 reps - Standing Anterior Toe Taps  - 2 x daily - 7 x weekly - 2 sets - 10 reps - Standing 3-Way Kick  - 2 x daily - 7 x weekly - 2 sets - 10 reps - Side Stepping with Counter Support  - 2 x daily - 7 x weekly - 2 sets - 10 reps - Mini Squat with Counter Support  - 2 x daily - 7 x weekly - 2 sets - 10 reps     ASSESSMENT:   CLINICAL IMPRESSION: Patient had more difficulty with deeper sit-to-stand due to fatigue and was able to complete 3 sets of 10 with difficulty. Pt used more UE support when stepping down from the increased step height. Pt was limited in their exercise today due to her most recent episode of chronic low back pain. Patient has ongoing LE weakness with a single limb hold and narrowed base of support positions. Pt will continue to benefit from skilled PT services to address deficits and improve function.     GOALS: Goals reviewed with patient? No   SHORT TERM GOALS: Target  date: 04/15/22   Pt will be independent with HEP in order to improve strength and balance in order to decrease fall risk and improve function at home. Baseline: pt HEP was reviewed for carryover effect 03/15/22 Goal status: INITIAL     LONG TERM GOALS: Target date: 05/13/22   Pt will increase FOTO to at least 61 to demonstrate significant improvement in function at home related to balance and pain. Baseline: 03/15/22: 60 (FOTO adjusted score of 46) Goal status: INITIAL   2.  Pt will increase in R knee extension MMT strength for ability to tolerate standing/walking demands needed for negotiating community ambulation. Baseline: 03/15/22: 3*/5  Goal status: INITIAL   3.  Pt will increase ability to perform 5TSTS to demonstrate ability to transfer to and from commode with less difficulty.  Baseline: 03/15/22: Unable to accomplish 5TSTS without arm rests Goal status: INITIAL   4.  Pt will be able to independently perform climbing a flight of stairs with unilateral UE or less support and reciprocal pattern to demonstrate increased ability to navigate home staircase.  Baseline: 03/15/22: heavy upper extremity reliance for stair negotiation and having to perform step-to with her home's stair height.  Goal status: INITIAL     PLAN: PT FREQUENCY: 2x/week   PT DURATION: 12 weeks   PLANNED INTERVENTIONS: Therapeutic exercises, Therapeutic activity, Neuromuscular re-education, Balance training, Gait training, Patient/Family education, Joint manipulation, Joint mobilization, Canalith repositioning, Aquatic Therapy, Dry Needling, Cognitive remediation, Electrical stimulation, Spinal manipulation, Spinal mobilization, Cryotherapy, Moist heat, Traction, Ultrasound, Ionotophoresis 57m/ml Dexamethasone, and Manual therapy   PLAN FOR NEXT SESSION: Abduction and quadriceps strengthening, sit-to-stand, internal rotation exercise, and update HEP for increase in difficulty appropriate to progress.      SVonna Drafts SPT 04/12/2022  9:07 AM

## 2022-04-14 ENCOUNTER — Ambulatory Visit: Payer: Medicaid Other | Admitting: Physical Therapy

## 2022-04-19 ENCOUNTER — Ambulatory Visit: Payer: Medicaid Other | Admitting: Physical Therapy

## 2022-04-19 NOTE — Therapy (Incomplete)
OUTPATIENT PHYSICAL THERAPY TREATMENT / PROGRESS NOTE   Patient Name: Kristin Lopez MRN: [id number] DOB:05/22/1965, 57 y.o., female Today's Date: 04/19/2022   END OF SESSION:        PCP: none on file  REFERRING PROVIDER: Haddix, Thomasene Lot, MD   REFERRING DIAGNOSIS: S72.302K (ICD-10-CM) - Unspecified fracture of shaft of left femur, subsequent encounter for closed fracture with nonunion     Past Medical History:  Diagnosis Date   Hyperlipidemia    Past Surgical History:  Procedure Laterality Date   ABDOMINAL HYSTERECTOMY  2021   FEMUR FRACTURE SURGERY     FEMUR IM NAIL Left 02/03/2021   Procedure: INTRAMEDULLARY (IM) RETROGRADE FEMORAL EXCHANGE NAILING;  Surgeon: Shona Needles, MD;  Location: [place];  Service: Orthopedics;  Laterality: Left;   FEMUR IM NAIL Right 06/30/2021   Procedure: EXCHANGE NAILING FEMUR NONUNION;  Surgeon: Shona Needles, MD;  Location: [place];  Service: Orthopedics;  Laterality: Right;   HARDWARE REMOVAL Left 02/03/2021   Procedure: HARDWARE REMOVAL;  Surgeon: Shona Needles, MD;  Location: [place];  Service: Orthopedics;  Laterality: Left;   KNEE SURGERY     SUBMANDIBULAR GLAND EXCISION  1982   Patient Active Problem List   Diagnosis Date Noted   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  Rationale for Evaluation and Treatment Rehabilitation  PERTINENT HISTORY: Pt reports that she typically walks without use of single point cane and only uses it for long distances and shopping. Getting up from a chair and couch is hard without pushing off from arm rest or legs. (Independent sit to  stand goal). Can't sleep on right side, because hardware in distal femur is uncomfortable in this position. Some buckling but less recently. Still can't get on her knees for those type of chores. Staying in one position for long periods of time causes them pain and after a long day of being on their feet causes more pain and fatigue. R LE tends to always be numb and has some tingling but less than previous visit.      Pain: Yes  R/L thigh/knee: Now/Best 3/10, Worst 5/10    R Ankle: Now/Best 0/10, Worst 7/10 (tends to improve with repeated motion) Numbness/Tingling: Yes Recent changes in overall health/medication: No Prior history of physical therapy for present condition: Yes, previous episode of care s/p R femoral exchange nailing Falls: Has patient fallen in last 6 months? Yes, Number of falls: 2 Directional pattern for falls: No   Imaging: Yes ,   X-ray 06/30/21:  IMPRESSION: Internal fixation of incompletely healed mid femoral shaft fracture, which is in near anatomic alignment.     Prior level of function: Independent and Independent with household mobility without device Occupational demands: -    Precautions: None   Weight Bearing Restrictions: No   Living Environment Lives with: lives with their family Lives in: House/apartment ; Pt reports having13 steps and uses railing. Has following equipment at home: Single point cane, grab bars in the shower, and raised commode over the toilet. Some uneven surfaces getting into home that she has to pay attention to decrease occurrence of  falls.      Patient Goals: Patient would like to be able to run and exercise, move more, and be able to lift more than 40 lbs. Pt would ike to get back to dancing with a little more ease.    PRECAUTIONS: none    SUBJECTIVE:                                                                                                                                                                                       SUBJECTIVE STATEMENT: Pt reports feeling tired today due to inconsistent sleep the past couple of nights. Pt report that they have been packing a lot at home for the upcoming move. Pt states that she also has been baby-sitting her nephew and is having chronic back pain that has limited her activity the past couple of days. Pt reports that she has a doctor appointment today for her procedure that she had on her nose.    PAIN:  Are you having pain? No   OBJECTIVE: (objective measures completed at initial evaluation unless otherwise dated)   Patient Surveys  FOTO: Pt reported 34 (FOTO risk adjusted 46),predicted improvement to 47 ABC: deferred     Cognition Patient is oriented to person, place, and time.  Recent memory is intact.  Remote memory is intact.  Attention span and concentration are intact.  Expressive speech is intact.  Patient's fund of knowledge is within normal limits for educational level.                            Gross Musculoskeletal Assessment Tremor: None Bulk: Normal Tone: Normal     GAIT: Distance walked: deferred Assistive device utilized: Single point cane used for longer distances Level of assistance: Complete Independence Comments: lateral trunk sway, decreased step length, decreased R knee flexion in swing phase.     Posture: No gross abnormalities noted in standing or seated posture     AROM       AROM (Normal range in degrees) AROM  03/15/2022         Hip Right Left  Flexion (125) 100 110  Extension (15)      Abduction (40)      Adduction       Internal Rotation (45)      External Rotation (45)             Knee      Flexion (135) 135* WNL  Extension (0) +3 +3         Ankle      Dorsiflexion (20)      Plantarflexion (50)  Inversion (35)      Eversion (15      (* = pain; Blank rows = not tested)     PROM Hip flexion: R WNL, L WNL  Knee PROM WNL, pain with end-range R knee flexion      LE MMT:   MMT (out of 5)  Right 03/15/2022 Left 03/15/2022  Hip flexion 4 4  Hip extension      Hip abduction 3 3  Hip adduction (seated) 5 5  Hip internal rotation 3 3  Hip external rotation 5 5    Knee flexion 5 5  Knee extension 3* 5  Ankle dorsiflexion 5 5  Ankle plantarflexion      Ankle inversion 4 5  Ankle eversion      (* = pain; Blank rows = not tested)     Sensation Deferred     Reflexes Deferred       FUNCTIONAL OUTCOME MEASURES     Results Comments  TUG 8.66 seconds    5TSTS Deferred due to deficit in sit to stand performance    (Blank rows = not tested)    TODAY'S TREATMENT     Therapeutic Exercise - improved strength as needed to improve performance of CKC activities/functional movements, for improved ability to perform stair negotiation and transferring  - NuStep Seat 10 handles 8 Resistance 4 for 5 minutes - for soft tissue warm-up to improve muscle performance and improve mobility   -subjective information gathered during this time   - Sit-to-stand from chair; 3x10  - limited by fatigue and ache in knees  - Step-ups on 6 inch step and Airex pad; 2x10 bilateral  - used unilateral UE support and minimal cueing on holding single limb stance  - single limb stance hold;  2x10 with 10 sec hold      *not today* - Banded lateral stepping with Red Theraband on agility ladder 2 D/B length of the agility ladder  - limited by fatigue and ache in knees - Mini squat in pain free range; 1x10  - forward mini lunge, adjacent to treadmill armrest as needed for balance; 1x10  - 3-way hip exercise with 4lbs. 3x12 bilateral - used unilateral UE support with minimal cueing for slow and controlled movements - side-lying hip abduction; 2x10 bilateral - prone hip extension; 2x10 bilateral - reverse clamshells; 2x10 bilateral - Total gym double-limb squat; 2x10  - moderate cueing on keeping heels on the platform and widening stance - Tandem stance on Airex: 4x1 minute (RLE behind and  then switched to LLE)    PATIENT EDUCATION:  Education details: Pt educated on appropriate level of fatigue and pain when performing activities at home and preparing for their move. Discussed current healing timeline for ORIFs. Person educated: Patient Education method: Explanation Education comprehension: verbalized understanding     HOME EXERCISE PROGRAM: Access Code: [email or web address] URL: [email or web address] Date: 03/17/2022 Prepared by: Valentina Gu  Exercises - Supine Active Straight Leg Raise  - 2 x daily - 7 x weekly - 2 sets - 10 reps - Supine Bridge  - 2 x daily - 7 x weekly - 2 sets - 10 reps - Standing Anterior Toe Taps  - 2 x daily - 7 x weekly - 2 sets - 10 reps - Standing 3-Way Kick  - 2 x daily - 7 x weekly - 2 sets - 10 reps - Side Stepping with Counter Support  - 2 x daily - 7 x weekly - 2 sets - 10  reps - Mini Squat with Counter Support  - 2 x daily - 7 x weekly - 2 sets - 10 reps     ASSESSMENT:   CLINICAL IMPRESSION: Patient had more difficulty with deeper sit-to-stand due to fatigue and was able to complete 3 sets of 10 with difficulty. Pt used more UE support when stepping down from the increased step height. Pt was limited in their exercise today due to her most recent episode of chronic low back pain. Patient has ongoing LE weakness with a single limb hold and narrowed base of support positions. Pt will continue to benefit from skilled PT services to address deficits and improve function.     GOALS: Goals reviewed with patient? No   SHORT TERM GOALS: Target date: 04/15/22   Pt will be independent with HEP in order to improve strength and balance in order to decrease fall risk and improve function at home. Baseline: pt HEP was reviewed for carryover effect 03/15/22 Goal status: INITIAL     LONG TERM GOALS: Target date: 05/13/22   Pt will increase FOTO to at least 61 to demonstrate significant improvement in function at home related to  balance and pain. Baseline: 03/15/22: 60 (FOTO adjusted score of 46) Goal status: INITIAL   2.  Pt will increase in R knee extension MMT strength for ability to tolerate standing/walking demands needed for negotiating community ambulation. Baseline: 03/15/22: 3*/5  Goal status: INITIAL   3.  Pt will increase ability to perform 5TSTS to demonstrate ability to transfer to and from commode with less difficulty.  Baseline: 03/15/22: Unable to accomplish 5TSTS without arm rests Goal status: INITIAL   4.  Pt will be able to independently perform climbing a flight of stairs with unilateral UE or less support and reciprocal pattern to demonstrate increased ability to navigate home staircase.  Baseline: 03/15/22: heavy upper extremity reliance for stair negotiation and having to perform step-to with her home's stair height.  Goal status: INITIAL     PLAN: PT FREQUENCY: 2x/week   PT DURATION: 12 weeks   PLANNED INTERVENTIONS: Therapeutic exercises, Therapeutic activity, Neuromuscular re-education, Balance training, Gait training, Patient/Family education, Joint manipulation, Joint mobilization, Canalith repositioning, Aquatic Therapy, Dry Needling, Cognitive remediation, Electrical stimulation, Spinal manipulation, Spinal mobilization, Cryotherapy, Moist heat, Traction, Ultrasound, Ionotophoresis 69m/ml Dexamethasone, and Manual therapy   PLAN FOR NEXT SESSION: Abduction and quadriceps strengthening, sit-to-stand, internal rotation exercise, and update HEP for increase in difficulty appropriate to progress.      SVonna Drafts SPT 04/19/2022  7:37 AM

## 2022-04-21 ENCOUNTER — Ambulatory Visit: Payer: Medicaid Other | Admitting: Physical Therapy

## 2022-04-21 NOTE — Therapy (Incomplete)
OUTPATIENT PHYSICAL THERAPY TREATMENT / PROGRESS NOTE   Patient Name: Kristin Lopez MRN: [id number] DOB:03/02/1965, 57 y.o., female Today's Date: 04/21/2022   END OF SESSION:        PCP: none on file  REFERRING PROVIDER: Haddix, Thomasene Lot, MD   REFERRING DIAGNOSIS: S72.302K (ICD-10-CM) - Unspecified fracture of shaft of left femur, subsequent encounter for closed fracture with nonunion     Past Medical History:  Diagnosis Date   Hyperlipidemia    Past Surgical History:  Procedure Laterality Date   ABDOMINAL HYSTERECTOMY  2021   FEMUR FRACTURE SURGERY     FEMUR IM NAIL Left 02/03/2021   Procedure: INTRAMEDULLARY (IM) RETROGRADE FEMORAL EXCHANGE NAILING;  Surgeon: Shona Needles, MD;  Location: [place];  Service: Orthopedics;  Laterality: Left;   FEMUR IM NAIL Right 06/30/2021   Procedure: EXCHANGE NAILING FEMUR NONUNION;  Surgeon: Shona Needles, MD;  Location: [place];  Service: Orthopedics;  Laterality: Right;   HARDWARE REMOVAL Left 02/03/2021   Procedure: HARDWARE REMOVAL;  Surgeon: Shona Needles, MD;  Location: [place];  Service: Orthopedics;  Laterality: Left;   KNEE SURGERY     SUBMANDIBULAR GLAND EXCISION  1982   Patient Active Problem List   Diagnosis Date Noted   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  Rationale for Evaluation and Treatment Rehabilitation  PERTINENT HISTORY: Pt reports that she typically walks without use of single point cane and only uses it for long distances and shopping. Getting up from a chair and couch is hard without pushing off from arm rest or legs. (Independent sit to  stand goal). Can't sleep on right side, because hardware in distal femur is uncomfortable in this position. Some buckling but less recently. Still can't get on her knees for those type of chores. Staying in one position for long periods of time causes them pain and after a long day of being on their feet causes more pain and fatigue. R LE tends to always be numb and has some tingling but less than previous visit.      Pain: Yes  R/L thigh/knee: Now/Best 3/10, Worst 5/10    R Ankle: Now/Best 0/10, Worst 7/10 (tends to improve with repeated motion) Numbness/Tingling: Yes Recent changes in overall health/medication: No Prior history of physical therapy for present condition: Yes, previous episode of care s/p R femoral exchange nailing Falls: Has patient fallen in last 6 months? Yes, Number of falls: 2 Directional pattern for falls: No   Imaging: Yes ,   X-ray 06/30/21:  IMPRESSION: Internal fixation of incompletely healed mid femoral shaft fracture, which is in near anatomic alignment.     Prior level of function: Independent and Independent with household mobility without device Occupational demands: -    Precautions: None   Weight Bearing Restrictions: No   Living Environment Lives with: lives with their family Lives in: House/apartment ; Pt reports having13 steps and uses railing. Has following equipment at home: Single point cane, grab bars in the shower, and raised commode over the toilet. Some uneven surfaces getting into home that she has to pay attention to decrease occurrence of  falls.      Patient Goals: Patient would like to be able to run and exercise, move more, and be able to lift more than 40 lbs. Pt would ike to get back to dancing with a little more ease.    PRECAUTIONS: none    SUBJECTIVE:                                                                                                                                                                                       SUBJECTIVE STATEMENT: Pt reports feeling tired today due to inconsistent sleep the past couple of nights. Pt report that they have been packing a lot at home for the upcoming move. Pt states that she also has been baby-sitting her nephew and is having chronic back pain that has limited her activity the past couple of days. Pt reports that she has a doctor appointment today for her procedure that she had on her nose.    PAIN:  Are you having pain? No   OBJECTIVE: (objective measures completed at initial evaluation unless otherwise dated)   Patient Surveys  FOTO: Pt reported 58 (FOTO risk adjusted 46),predicted improvement to 47 ABC: deferred     Cognition Patient is oriented to person, place, and time.  Recent memory is intact.  Remote memory is intact.  Attention span and concentration are intact.  Expressive speech is intact.  Patient's fund of knowledge is within normal limits for educational level.                            Gross Musculoskeletal Assessment Tremor: None Bulk: Normal Tone: Normal     GAIT: Distance walked: deferred Assistive device utilized: Single point cane used for longer distances Level of assistance: Complete Independence Comments: lateral trunk sway, decreased step length, decreased R knee flexion in swing phase.     Posture: No gross abnormalities noted in standing or seated posture     AROM       AROM (Normal range in degrees) AROM  03/15/2022         Hip Right Left  Flexion (125) 100 110  Extension (15)      Abduction (40)      Adduction       Internal Rotation (45)      External Rotation (45)             Knee      Flexion (135) 135* WNL  Extension (0) +3 +3         Ankle      Dorsiflexion (20)      Plantarflexion (50)  Inversion (35)      Eversion (15      (* = pain; Blank rows = not tested)     PROM Hip flexion: R WNL, L WNL  Knee PROM WNL, pain with end-range R knee flexion      LE MMT:   MMT (out of 5)  Right 03/15/2022 Left 03/15/2022  Hip flexion 4 4  Hip extension      Hip abduction 3 3  Hip adduction (seated) 5 5  Hip internal rotation 3 3  Hip external rotation 5 5    Knee flexion 5 5  Knee extension 3* 5  Ankle dorsiflexion 5 5  Ankle plantarflexion      Ankle inversion 4 5  Ankle eversion      (* = pain; Blank rows = not tested)     Sensation Deferred     Reflexes Deferred       FUNCTIONAL OUTCOME MEASURES     Results Comments  TUG 8.66 seconds    5TSTS Deferred due to deficit in sit to stand performance    (Blank rows = not tested)    TODAY'S TREATMENT     Therapeutic Exercise - improved strength as needed to improve performance of CKC activities/functional movements, for improved ability to perform stair negotiation and transferring  - NuStep Seat 10 handles 8 Resistance 4 for 5 minutes - for soft tissue warm-up to improve muscle performance and improve mobility   -subjective information gathered during this time   - Sit-to-stand from chair; 3x10  - limited by fatigue and ache in knees  - Step-ups on 6 inch step and Airex pad; 2x10 bilateral  - used unilateral UE support and minimal cueing on holding single limb stance  - single limb stance hold;  2x10 with 10 sec hold      *not today* - Banded lateral stepping with Red Theraband on agility ladder 2 D/B length of the agility ladder  - limited by fatigue and ache in knees - Mini squat in pain free range; 1x10  - forward mini lunge, adjacent to treadmill armrest as needed for balance; 1x10  - 3-way hip exercise with 4lbs. 3x12 bilateral - used unilateral UE support with minimal cueing for slow and controlled movements - side-lying hip abduction; 2x10 bilateral - prone hip extension; 2x10 bilateral - reverse clamshells; 2x10 bilateral - Total gym double-limb squat; 2x10  - moderate cueing on keeping heels on the platform and widening stance - Tandem stance on Airex: 4x1 minute (RLE behind and  then switched to LLE)    PATIENT EDUCATION:  Education details: Pt educated on appropriate level of fatigue and pain when performing activities at home and preparing for their move. Discussed current healing timeline for ORIFs. Person educated: Patient Education method: Explanation Education comprehension: verbalized understanding     HOME EXERCISE PROGRAM: Access Code: [email or web address] URL: [email or web address] Date: 03/17/2022 Prepared by: Valentina Gu  Exercises - Supine Active Straight Leg Raise  - 2 x daily - 7 x weekly - 2 sets - 10 reps - Supine Bridge  - 2 x daily - 7 x weekly - 2 sets - 10 reps - Standing Anterior Toe Taps  - 2 x daily - 7 x weekly - 2 sets - 10 reps - Standing 3-Way Kick  - 2 x daily - 7 x weekly - 2 sets - 10 reps - Side Stepping with Counter Support  - 2 x daily - 7 x weekly - 2 sets - 10  reps - Mini Squat with Counter Support  - 2 x daily - 7 x weekly - 2 sets - 10 reps     ASSESSMENT:   CLINICAL IMPRESSION: Patient had more difficulty with deeper sit-to-stand due to fatigue and was able to complete 3 sets of 10 with difficulty. Pt used more UE support when stepping down from the increased step height. Pt was limited in their exercise today due to her most recent episode of chronic low back pain. Patient has ongoing LE weakness with a single limb hold and narrowed base of support positions. Pt will continue to benefit from skilled PT services to address deficits and improve function.     GOALS: Goals reviewed with patient? No   SHORT TERM GOALS: Target date: 04/15/22   Pt will be independent with HEP in order to improve strength and balance in order to decrease fall risk and improve function at home. Baseline: pt HEP was reviewed for carryover effect 03/15/22 Goal status: INITIAL     LONG TERM GOALS: Target date: 05/13/22   Pt will increase FOTO to at least 61 to demonstrate significant improvement in function at home related to  balance and pain. Baseline: 03/15/22: 60 (FOTO adjusted score of 46) Goal status: INITIAL   2.  Pt will increase in R knee extension MMT strength for ability to tolerate standing/walking demands needed for negotiating community ambulation. Baseline: 03/15/22: 3*/5  Goal status: INITIAL   3.  Pt will increase ability to perform 5TSTS to demonstrate ability to transfer to and from commode with less difficulty.  Baseline: 03/15/22: Unable to accomplish 5TSTS without arm rests Goal status: INITIAL   4.  Pt will be able to independently perform climbing a flight of stairs with unilateral UE or less support and reciprocal pattern to demonstrate increased ability to navigate home staircase.  Baseline: 03/15/22: heavy upper extremity reliance for stair negotiation and having to perform step-to with her home's stair height.  Goal status: INITIAL     PLAN: PT FREQUENCY: 2x/week   PT DURATION: 12 weeks   PLANNED INTERVENTIONS: Therapeutic exercises, Therapeutic activity, Neuromuscular re-education, Balance training, Gait training, Patient/Family education, Joint manipulation, Joint mobilization, Canalith repositioning, Aquatic Therapy, Dry Needling, Cognitive remediation, Electrical stimulation, Spinal manipulation, Spinal mobilization, Cryotherapy, Moist heat, Traction, Ultrasound, Ionotophoresis 16m/ml Dexamethasone, and Manual therapy   PLAN FOR NEXT SESSION: Abduction and quadriceps strengthening, sit-to-stand, internal rotation exercise, and update HEP for increase in difficulty appropriate to progress.      SVonna Drafts SPT 04/21/2022  7:27 AM

## 2022-04-26 ENCOUNTER — Encounter: Payer: Self-pay | Admitting: Physical Therapy

## 2022-04-26 ENCOUNTER — Ambulatory Visit: Payer: Medicaid Other | Admitting: Physical Therapy

## 2022-04-26 DIAGNOSIS — R262 Difficulty in walking, not elsewhere classified: Secondary | ICD-10-CM | POA: Diagnosis not present

## 2022-04-26 DIAGNOSIS — M6281 Muscle weakness (generalized): Secondary | ICD-10-CM

## 2022-04-26 NOTE — Therapy (Signed)
OUTPATIENT PHYSICAL THERAPY TREATMENT / PROGRESS NOTE   Patient Name: Kristin Lopez MRN: [id number] DOB:[birth year], 57 y.o., female Today's Date: 04/26/2022   END OF SESSION:   PT End of Session - 04/26/22 1635     Visit Number 10    Number of Visits 17    Date for PT Re-Evaluation 04/22/22    PT Start Time 0815    PT Stop Time 0902    PT Time Calculation (min) 47 min    Activity Tolerance Patient tolerated treatment well    Behavior During Therapy WFL for tasks assessed/performed             PCP: none on file  REFERRING PROVIDER: Haddix, Thomasene Lot, MD   REFERRING DIAGNOSIS: S72.302K (ICD-10-CM) - Unspecified fracture of shaft of left femur, subsequent encounter for closed fracture with nonunion     Past Medical History:  Diagnosis Date   Hyperlipidemia    Past Surgical History:  Procedure Laterality Date   ABDOMINAL HYSTERECTOMY  2021   FEMUR FRACTURE SURGERY     FEMUR IM NAIL Left 02/03/2021   Procedure: INTRAMEDULLARY (IM) RETROGRADE FEMORAL EXCHANGE NAILING;  Surgeon: Shona Needles, MD;  Location: [place];  Service: Orthopedics;  Laterality: Left;   FEMUR IM NAIL Right 06/30/2021   Procedure: EXCHANGE NAILING FEMUR NONUNION;  Surgeon: Shona Needles, MD;  Location: [place];  Service: Orthopedics;  Laterality: Right;   HARDWARE REMOVAL Left 02/03/2021   Procedure: HARDWARE REMOVAL;  Surgeon: Shona Needles, MD;  Location: [place];  Service: Orthopedics;  Laterality: Left;   KNEE SURGERY     SUBMANDIBULAR GLAND EXCISION  1982   Patient Active Problem List   Diagnosis Date Noted   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 Getting up from a chair and couch is hard without pushing off from arm rest or legs. (Independent sit to stand goal). Can't sleep on right side, because hardware in distal femur is uncomfortable in this position. Some buckling but less recently. Still can't get on her knees for those type of chores. Staying in one position for long periods of time causes them pain and after a long day of being on their feet causes more pain and fatigue. R LE tends to always be numb and has some tingling but less than previous visit.      Pain: Yes  R/L thigh/knee: Now/Best 3/10, Worst 5/10    R Ankle: Now/Best 0/10, Worst 7/10 (tends to improve with repeated motion) Numbness/Tingling: Yes Recent changes in overall health/medication: No Prior history of physical therapy for present condition: Yes, previous episode of care s/p R femoral exchange nailing Falls: Has patient fallen in last 6 months? Yes, Number of falls: 2 Directional pattern for falls: No   Imaging: Yes ,   X-ray 06/30/21:  IMPRESSION: Internal fixation of incompletely healed mid femoral shaft fracture, which is in near anatomic alignment.  Prior level of function: Independent and Independent with household mobility without device Occupational demands: -    Precautions: None   Weight Bearing Restrictions: No   Living Environment Lives with: lives with their family Lives in: House/apartment ; Pt reports having13 steps and uses railing. Has following equipment at home: Single point cane, grab bars in the shower, and raised commode over the toilet. Some uneven surfaces getting into home that she has to pay attention to decrease occurrence of falls.      Patient  Goals: Patient would like to be able to run and exercise, move more, and be able to lift more than 40 lbs. Pt would ike to get back to dancing with a little more ease.    PRECAUTIONS: none    SUBJECTIVE:                                                                                                                                                                                      SUBJECTIVE STATEMENT: Pt reports that she feels about 50% back to normal. Pt states that she used the ladder for painting in the house and felt like she was not strong enough to maintain balance. Pt reports that walking about one block is her limit for distance before she gets tired.    PAIN:  Are you having pain? No   OBJECTIVE: (objective measures completed at initial evaluation unless otherwise dated)   Patient Surveys  FOTO: Pt reported 52 (FOTO risk adjusted 46),predicted improvement to 7 ABC: deferred     Cognition Patient is oriented to person, place, and time.  Recent memory is intact.  Remote memory is intact.  Attention span and concentration are intact.  Expressive speech is intact.  Patient's fund of knowledge is within normal limits for educational level.                            Gross Musculoskeletal Assessment Tremor: None Bulk: Normal Tone: Normal     GAIT: Distance walked: deferred Assistive device utilized: Single point cane used for longer distances Level of assistance: Complete Independence Comments: lateral trunk sway, decreased step length, decreased R knee flexion in swing phase.     Posture: No gross abnormalities noted in standing or seated posture     AROM       AROM (Normal range in degrees) AROM  03/15/2022         Hip Right Left  Flexion (125) 100 110  Extension (15)      Abduction (40)      Adduction       Internal Rotation (45)  External Rotation (45)             Knee      Flexion (135) 135* WNL  Extension (0) +3 +3         Ankle       Dorsiflexion (20)      Plantarflexion (50)      Inversion (35)      Eversion (15      (* = pain; Blank rows = not tested)     PROM Hip flexion: R WNL, L WNL  Knee PROM WNL, pain with end-range R knee flexion      LE MMT:   MMT (out of 5) Right 03/15/2022 Left 03/15/2022 Right 04/26/22 Left 04/26/22  Hip flexion '4 4 4 4  '$ Hip extension        Hip abduction 3 3 4- 4-  Hip adduction (seated) 5 5    Hip internal rotation '3 3 4 3  '$ Hip external rotation '5 5   5 5  '$ Knee flexion '5 5 5 5  '$ Knee extension 3* 5 3* 5  Ankle dorsiflexion '5 5 5 5  '$ Ankle plantarflexion        Ankle inversion '4 5 5 5  '$ Ankle eversion        (* = pain; Blank rows = not tested)     Sensation Deferred     Reflexes Deferred       FUNCTIONAL OUTCOME MEASURES INITIAL EVAL   Results Comments  TUG 8.66 seconds    5TSTS Deferred due to deficit in sit to stand performance    (Blank rows = not tested)    TODAY'S TREATMENT     Therapeutic Exercise - improved strength as needed to improve performance of CKC activities/functional movements, for improved ability to perform stair negotiation and transferring   *GOAL UPDATE PERFORMED  - NuStep Seat 10 handles 8 Resistance 4 for 5 minutes - for soft tissue warm-up to improve muscle performance and improve mobility   -subjective information gathered during this time   - Sit-to-stand from chair; 3x10  - limited by fatigue and ache in knees  - Banded lateral stepping with Red Tband on agility ladder 2x4 D/B length of the agility ladder  - limited by fatigue and ache in knees  - performance of 5TSTS; x3 for patient understanding and average of movement  - performance of MMT  - performance of outside staircase; performed with reciprocal pattern and unilateral UE support going up and down the staircase      *not today* - Step-ups on 6 inch step and Airex pad; 2x10 bilateral  - used unilateral UE support and minimal cueing on holding single limb  stance - single limb stance hold;  2x10 with 10 sec hold - Banded lateral stepping with Red Theraband on agility ladder 2 D/B length of the agility ladder  - limited by fatigue and ache in knees - Mini squat in pain free range; 1x10  - forward mini lunge, adjacent to treadmill armrest as needed for balance; 1x10  - 3-way hip exercise with 4lbs. 3x12 bilateral - used unilateral UE support with minimal cueing for slow and controlled movements - side-lying hip abduction; 2x10 bilateral - prone hip extension; 2x10 bilateral - reverse clamshells; 2x10 bilateral - Total gym double-limb squat; 2x10  - moderate cueing on keeping heels on the platform and widening stance - Tandem stance on Airex: 4x1 minute (RLE behind and then switched to LLE)    PATIENT EDUCATION:  Education  details: Pt educated on performing HEP for long term goals.  Person educated: Patient Education method: Explanation Education comprehension: verbalized understanding     HOME EXERCISE PROGRAM: Access Code: [email or web address] URL: [email or web address] Date: 03/17/2022 Prepared by: Valentina Gu  Exercises - Supine Active Straight Leg Raise  - 2 x daily - 7 x weekly - 2 sets - 10 reps - Supine Bridge  - 2 x daily - 7 x weekly - 2 sets - 10 reps - Standing Anterior Toe Taps  - 2 x daily - 7 x weekly - 2 sets - 10 reps - Standing 3-Way Kick  - 2 x daily - 7 x weekly - 2 sets - 10 reps - Side Stepping with Counter Support  - 2 x daily - 7 x weekly - 2 sets - 10 reps - Mini Squat with Counter Support  - 2 x daily - 7 x weekly - 2 sets - 10 reps     ASSESSMENT:   CLINICAL IMPRESSION: Patient demonstrated minimal increase in R MMT strength testing with hip abduction, IR, and ankle inversion. Pt has remaining improvements to gain with hip flexion, abduction, IR, and knee extension. Pt was able to perform sit-to-stand activity without UE support and performed a rocking trunk strategy for generation of momentum for  completion of activity. Pt has remaining deficit with sit-to-stand transfer and 5TSTS goal has been updated for future assessment activity performance. Pt demonstrated unilateral UE support with step-to gait pattern on outside staircase. Pt required moderate unilateral UE support with ascending the staircase and heavy unilateral UE support for descending the staircase. Pt is still unable to perform reciprocal step pattern with stair ascent or descent and uses step-to pattern for safety. Pt had a FOTO score of 55/61 and score may reflect pt's more recent back pain and limitations associated with recent episode for overall progression with PT. Pt continues to demonstrate antalgic gait and use of single-point cane with walking. Pt will continue to benefit from skilled PT services to address deficits and improve function.     GOALS: Goals reviewed with patient? No   SHORT TERM GOALS: Target date: 04/15/22   Pt will be independent with HEP in order to improve strength and balance in order to decrease fall risk and improve function at home. Baseline: 03/15/22: pt HEP was reviewed for carryover effect.    04/26/22: pt has held off on doing HEP in the past couple of weeks with recently moving and latest episode of chronic low back pain.  Goal status: ON-GOING     LONG TERM GOALS: Target date: 05/13/22   Pt will increase FOTO to at least 61 to demonstrate significant improvement in function at home related to balance and pain. Baseline: 03/15/22: 60 (FOTO adjusted score of 46).   04/26/22: 55 Goal status: ON-GOING   2.  Pt will increase in R knee extension MMT strength for ability to tolerate standing/walking demands needed for negotiating community ambulation. Baseline: 03/15/22: 3*/5      04/26/22: 3*/5 Goal status: ON-GOING   3.  Pt will decrease time of performance of 5TSTS by 4 seconds to demonstrate improved ability to transfer to-and-from commode with decreased difficulty.  Baseline: 03/15/22: Unable  to accomplish 5TSTS without arm rests    04/26/22: pt performed 5TSTS in 17.73 sec. Goal status: ON-GOING   4.  Pt will be able to independently perform climbing a flight of stairs with unilateral UE or less support and reciprocal pattern to demonstrate increased ability to navigate  home staircase.  Baseline: 03/15/22: heavy upper extremity reliance for stair negotiation and having to perform step-to with her home's stair height.   04/26/22: moderate unilateral UE support with step-to-step pattern when ascending and descending the stairs; pt has more UE reliance in descending staircase.  Goal status: ON-GOING     PLAN: PT FREQUENCY: 2x/week   PT DURATION: 4-6 weeks   PLANNED INTERVENTIONS: Therapeutic exercises, Therapeutic activity, Neuromuscular re-education, Balance training, Gait training, Patient/Family education, Joint manipulation, Joint mobilization, Canalith repositioning, Aquatic Therapy, Dry Needling, Cognitive remediation, Electrical stimulation, Spinal manipulation, Spinal mobilization, Cryotherapy, Moist heat, Traction, Ultrasound, Ionotophoresis '4mg'$ /ml Dexamethasone, and Manual therapy   PLAN FOR NEXT SESSION: Abduction and quadriceps strengthening, sit-to-stand, internal rotation exercise, and update HEP for increase in difficulty appropriate to progress.      Vonna Drafts, SPT 04/26/2022  4:56 PM

## 2022-04-28 ENCOUNTER — Encounter: Payer: Medicaid Other | Admitting: Physical Therapy

## 2022-05-03 ENCOUNTER — Ambulatory Visit: Payer: Medicare HMO | Attending: Student | Admitting: Physical Therapy

## 2022-05-03 NOTE — Therapy (Deleted)
OUTPATIENT PHYSICAL THERAPY TREATMENT / PROGRESS NOTE   Patient Name: Kristin Lopez MRN: [id number] DOB:02-Jan-1966, 57 y.o., female Today's Date: 05/03/2022   END OF SESSION:     PCP: none on file  REFERRING PROVIDER: Haddix, Thomasene Lot, MD   REFERRING DIAGNOSIS: S72.302K (ICD-10-CM) - Unspecified fracture of shaft of left femur, subsequent encounter for closed fracture with nonunion     Past Medical History:  Diagnosis Date   Hyperlipidemia    Past Surgical History:  Procedure Laterality Date   ABDOMINAL HYSTERECTOMY  2021   FEMUR FRACTURE SURGERY     FEMUR IM NAIL Left 02/03/2021   Procedure: INTRAMEDULLARY (IM) RETROGRADE FEMORAL EXCHANGE NAILING;  Surgeon: Shona Needles, MD;  Location: [place];  Service: Orthopedics;  Laterality: Left;   FEMUR IM NAIL Right 06/30/2021   Procedure: EXCHANGE NAILING FEMUR NONUNION;  Surgeon: Shona Needles, MD;  Location: [place];  Service: Orthopedics;  Laterality: Right;   HARDWARE REMOVAL Left 02/03/2021   Procedure: HARDWARE REMOVAL;  Surgeon: Shona Needles, MD;  Location: [place];  Service: Orthopedics;  Laterality: Left;   KNEE SURGERY     SUBMANDIBULAR GLAND EXCISION  1982   Patient Active Problem List   Diagnosis Date Noted   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 Getting up from a chair and couch is hard without  pushing off from arm rest or legs. (Independent sit to stand goal). Can't sleep on right side, because hardware in distal femur is uncomfortable in this position. Some buckling but less recently. Still can't get on her knees for those type of chores. Staying in one position for long periods of time causes them pain and after a long day of being on their feet causes more pain and fatigue. R LE tends to always be numb and has some tingling but less than previous visit.      Pain: Yes  R/L thigh/knee: Now/Best 3/10, Worst 5/10    R Ankle: Now/Best 0/10, Worst 7/10 (tends to improve with repeated motion) Numbness/Tingling: Yes Recent changes in overall health/medication: No Prior history of physical therapy for present condition: Yes, previous episode of care s/p R femoral exchange nailing Falls: Has patient fallen in last 6 months? Yes, Number of falls: 2 Directional pattern for falls: No   Imaging: Yes ,   X-ray 06/30/21:  IMPRESSION: Internal fixation of incompletely healed mid femoral shaft fracture, which is in near anatomic alignment.     Prior level of function: Independent and Independent with household mobility without device Occupational demands: -    Precautions: None   Weight Bearing Restrictions: No   Living Environment Lives with: lives with their family Lives in: House/apartment ; Pt reports having13 steps and uses railing. Has following equipment at home: Single point cane, grab bars in the shower, and raised commode over the toilet. Some uneven surfaces getting into home that she has to pay attention  to decrease occurrence of falls.      Patient Goals: Patient would like to be able to run and exercise, move more, and be able to lift more than 40 lbs. Pt would ike to get back to dancing with a little more ease.    PRECAUTIONS: none    SUBJECTIVE:                                                                                                                                                                                       SUBJECTIVE STATEMENT: Pt reports that she feels about 50% back to normal. Pt states that she used the ladder for painting in the house and felt like she was not strong enough to maintain balance. Pt reports that walking about one block is her limit for distance before she gets tired.    PAIN:  Are you having pain? No   OBJECTIVE: (objective measures completed at initial evaluation unless otherwise dated)   Patient Surveys  FOTO: Pt reported 24 (FOTO risk adjusted 46),predicted improvement to 79 ABC: deferred     Cognition Patient is oriented to person, place, and time.  Recent memory is intact.  Remote memory is intact.  Attention span and concentration are intact.  Expressive speech is intact.  Patient's fund of knowledge is within normal limits for educational level.                            Gross Musculoskeletal Assessment Tremor: None Bulk: Normal Tone: Normal     GAIT: Distance walked: deferred Assistive device utilized: Single point cane used for longer distances Level of assistance: Complete Independence Comments: lateral trunk sway, decreased step length, decreased R knee flexion in swing phase.     Posture: No gross abnormalities noted in standing or seated posture     AROM       AROM (Normal range in degrees) AROM  03/15/2022         Hip Right Left  Flexion (125) 100 110  Extension (15)      Abduction (40)      Adduction       Internal Rotation (45)      External Rotation (45)             Knee      Flexion (135) 135* WNL  Extension (0) +3 +3         Ankle      Dorsiflexion (20)      Plantarflexion (50)      Inversion (35)      Eversion (15      (* =  pain; Blank rows = not tested)     PROM Hip flexion: R WNL, L WNL  Knee PROM WNL, pain with end-range R knee flexion      LE MMT:   MMT (out of 5) Right 03/15/2022 Left 03/15/2022 Right 04/26/22 Left 04/26/22  Hip flexion '4 4 4  4  '$ Hip extension        Hip abduction 3 3 4- 4-  Hip adduction (seated) 5 5    Hip internal rotation '3 3 4 3  '$ Hip external rotation '5 5   5 5  '$ Knee flexion '5 5 5 5  '$ Knee extension 3* 5 3* 5  Ankle dorsiflexion '5 5 5 5  '$ Ankle plantarflexion        Ankle inversion '4 5 5 5  '$ Ankle eversion        (* = pain; Blank rows = not tested)     Sensation Deferred     Reflexes Deferred       FUNCTIONAL OUTCOME MEASURES INITIAL EVAL   Results Comments  TUG 8.66 seconds    5TSTS Deferred due to deficit in sit to stand performance    (Blank rows = not tested)    TODAY'S TREATMENT     Therapeutic Exercise - improved strength as needed to improve performance of CKC activities/functional movements, for improved ability to perform stair negotiation and transferring   *GOAL UPDATE PERFORMED  - NuStep Seat 10 handles 8 Resistance 4 for 5 minutes - for soft tissue warm-up to improve muscle performance and improve mobility   -subjective information gathered during this time   - Sit-to-stand from chair; 3x10  - limited by fatigue and ache in knees  - Banded lateral stepping with Red Tband on agility ladder 2x4 D/B length of the agility ladder  - limited by fatigue and ache in knees  - performance of 5TSTS; x3 for patient understanding and average of movement  - performance of MMT  - performance of outside staircase; performed with reciprocal pattern and unilateral UE support going up and down the staircase      *not today* - Step-ups on 6 inch step and Airex pad; 2x10 bilateral  - used unilateral UE support and minimal cueing on holding single limb stance - single limb stance hold;  2x10 with 10 sec hold - Banded lateral stepping with Red Theraband on agility ladder 2 D/B length of the agility ladder  - limited by fatigue and ache in knees - Mini squat in pain free range; 1x10  - forward mini lunge, adjacent to treadmill armrest as needed for balance; 1x10  - 3-way hip  exercise with 4lbs. 3x12 bilateral - used unilateral UE support with minimal cueing for slow and controlled movements - side-lying hip abduction; 2x10 bilateral - prone hip extension; 2x10 bilateral - reverse clamshells; 2x10 bilateral - Total gym double-limb squat; 2x10  - moderate cueing on keeping heels on the platform and widening stance - Tandem stance on Airex: 4x1 minute (RLE behind and then switched to LLE)    PATIENT EDUCATION:  Education details: Pt educated on performing HEP for long term goals.  Person educated: Patient Education method: Explanation Education comprehension: verbalized understanding     HOME EXERCISE PROGRAM: Access Code: [email or web address] URL: [email or web address] Date: 03/17/2022 Prepared by: Valentina Gu  Exercises - Supine Active Straight Leg Raise  - 2 x daily - 7 x weekly - 2 sets - 10 reps - Supine Bridge  - 2 x daily - 7 x weekly -  2 sets - 10 reps - Standing Anterior Toe Taps  - 2 x daily - 7 x weekly - 2 sets - 10 reps - Standing 3-Way Kick  - 2 x daily - 7 x weekly - 2 sets - 10 reps - Side Stepping with Counter Support  - 2 x daily - 7 x weekly - 2 sets - 10 reps - Mini Squat with Counter Support  - 2 x daily - 7 x weekly - 2 sets - 10 reps     ASSESSMENT:   CLINICAL IMPRESSION: Patient demonstrated minimal increase in R MMT strength testing with hip abduction, IR, and ankle inversion. Pt has remaining improvements to gain with hip flexion, abduction, IR, and knee extension. Pt was able to perform sit-to-stand activity without UE support and performed a rocking trunk strategy for generation of momentum for completion of activity. Pt has remaining deficit with sit-to-stand transfer and 5TSTS goal has been updated for future assessment activity performance. Pt demonstrated unilateral UE support with step-to gait pattern on outside staircase. Pt required moderate unilateral UE support with ascending the staircase and heavy unilateral  UE support for descending the staircase. Pt is still unable to perform reciprocal step pattern with stair ascent or descent and uses step-to pattern for safety. Pt had a FOTO score of 55/61 and score may reflect pt's more recent back pain and limitations associated with recent episode for overall progression with PT. Pt continues to demonstrate antalgic gait and use of single-point cane with walking. Pt will continue to benefit from skilled PT services to address deficits and improve function.     GOALS: Goals reviewed with patient? No   SHORT TERM GOALS: Target date: 04/15/22   Pt will be independent with HEP in order to improve strength and balance in order to decrease fall risk and improve function at home. Baseline: 03/15/22: pt HEP was reviewed for carryover effect.    04/26/22: pt has held off on doing HEP in the past couple of weeks with recently moving and latest episode of chronic low back pain.  Goal status: ON-GOING     LONG TERM GOALS: Target date: 05/13/22   Pt will increase FOTO to at least 61 to demonstrate significant improvement in function at home related to balance and pain. Baseline: 03/15/22: 60 (FOTO adjusted score of 46).   04/26/22: 55 Goal status: ON-GOING   2.  Pt will increase in R knee extension MMT strength for ability to tolerate standing/walking demands needed for negotiating community ambulation. Baseline: 03/15/22: 3*/5      04/26/22: 3*/5 Goal status: ON-GOING   3.  Pt will decrease time of performance of 5TSTS by 4 seconds to demonstrate improved ability to transfer to-and-from commode with decreased difficulty.  Baseline: 03/15/22: Unable to accomplish 5TSTS without arm rests    04/26/22: pt performed 5TSTS in 17.73 sec. Goal status: ON-GOING   4.  Pt will be able to independently perform climbing a flight of stairs with unilateral UE or less support and reciprocal pattern to demonstrate increased ability to navigate home staircase.  Baseline: 03/15/22: heavy  upper extremity reliance for stair negotiation and having to perform step-to with her home's stair height.   04/26/22: moderate unilateral UE support with step-to-step pattern when ascending and descending the stairs; pt has more UE reliance in descending staircase.  Goal status: ON-GOING     PLAN: PT FREQUENCY: 2x/week   PT DURATION: 4-6 weeks   PLANNED INTERVENTIONS: Therapeutic exercises, Therapeutic activity, Neuromuscular re-education, Balance  training, Gait training, Patient/Family education, Joint manipulation, Joint mobilization, Canalith repositioning, Aquatic Therapy, Dry Needling, Cognitive remediation, Electrical stimulation, Spinal manipulation, Spinal mobilization, Cryotherapy, Moist heat, Traction, Ultrasound, Ionotophoresis '4mg'$ /ml Dexamethasone, and Manual therapy   PLAN FOR NEXT SESSION: Abduction and quadriceps strengthening, sit-to-stand, internal rotation exercise, and update HEP for increase in difficulty appropriate to progress.      Vonna Drafts, SPT 05/03/2022  8:31 AM

## 2022-05-05 ENCOUNTER — Ambulatory Visit: Payer: Medicare HMO | Admitting: Physical Therapy

## 2022-05-10 ENCOUNTER — Ambulatory Visit: Payer: Medicare HMO | Attending: Student | Admitting: Physical Therapy

## 2022-05-10 DIAGNOSIS — R262 Difficulty in walking, not elsewhere classified: Secondary | ICD-10-CM | POA: Insufficient documentation

## 2022-05-10 DIAGNOSIS — M6281 Muscle weakness (generalized): Secondary | ICD-10-CM | POA: Insufficient documentation

## 2022-05-10 NOTE — Therapy (Signed)
OUTPATIENT PHYSICAL THERAPY TREATMENT  Patient Name: Kristin Lopez MRN: [id number] DOB:03-28-1965, 57 y.o., female Today's Date: 05/10/2022   END OF SESSION:   PT End of Session - 05/10/22 0906     Visit Number 11    Number of Visits 17    Date for PT Re-Evaluation 06/22/22    PT Start Time 0903    PT Stop Time 0945    PT Time Calculation (min) 42 min    Activity Tolerance Patient tolerated treatment well    Behavior During Therapy [facility] for tasks assessed/performed             Past Medical History:  Diagnosis Date   Hyperlipidemia    Past Surgical History:  Procedure Laterality Date   ABDOMINAL HYSTERECTOMY  2021   FEMUR FRACTURE SURGERY     FEMUR IM NAIL Left 02/03/2021   Procedure: INTRAMEDULLARY (IM) RETROGRADE FEMORAL EXCHANGE NAILING;  Surgeon: Shona Needles, MD;  Location: [place];  Service: Orthopedics;  Laterality: Left;   FEMUR IM NAIL Right 06/30/2021   Procedure: EXCHANGE NAILING FEMUR NONUNION;  Surgeon: Shona Needles, MD;  Location: [place];  Service: Orthopedics;  Laterality: Right;   HARDWARE REMOVAL Left 02/03/2021   Procedure: HARDWARE REMOVAL;  Surgeon: Shona Needles, MD;  Location: [place];  Service: Orthopedics;  Laterality: Left;   KNEE SURGERY     SUBMANDIBULAR GLAND EXCISION  1982   Patient Active Problem List   Diagnosis Date Noted   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 06/30/2021   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 02/03/2021   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 01/14/2021     PCP: none on file  REFERRING PROVIDER: Haddix, Thomasene Lot, MD   REFERRING DIAGNOSIS: S72.302K (ICD-10-CM) - Unspecified fracture of shaft of left femur, subsequent encounter for closed fracture with nonunion    REFERRING DIAG: S72.302K (ICD-10-CM) - Unspecified fracture of shaft of left femur, subsequent encounter for closed fracture with nonunion    THERAPY DIAG:  Difficulty in  walking, not elsewhere classified  Muscle weakness (generalized)  Rationale for Evaluation and Treatment Rehabilitation  PERTINENT HISTORY: Pt reports that she typically walks without use of single point cane and only uses it for long distances and shopping. Getting up from a chair and couch is hard without pushing off from arm rest or legs. (Independent sit to stand goal). Can't sleep on right side, because hardware in distal femur is uncomfortable in this position. Some buckling but less recently. Still can't get on her knees for those type of chores. Staying in one position for long periods of time causes them pain and after a long day of being on their feet causes more pain and fatigue. R LE tends to always be numb and has some tingling but less than previous visit.      Pain: Yes  R/L thigh/knee: Now/Best 3/10, Worst 5/10    R Ankle: Now/Best 0/10, Worst 7/10 (tends to improve with repeated motion) Numbness/Tingling: Yes Recent changes in overall health/medication: No Prior history of physical therapy for present condition: Yes, previous episode of care s/p R femoral exchange nailing Falls: Has patient fallen in last 6 months? Yes, Number of falls: 2 Directional pattern for falls: No   Imaging: Yes ,   X-ray 06/30/21:  IMPRESSION: Internal fixation of incompletely healed mid femoral shaft fracture, which is in near anatomic alignment.     Prior  level of function: Independent and Independent with household mobility without device Occupational demands: -    Precautions: None   Weight Bearing Restrictions: No   Living Environment Lives with: lives with their family Lives in: House/apartment ; Pt reports having13 steps and uses railing. Has following equipment at home: Single point cane, grab bars in the shower, and raised commode over the toilet. Some uneven surfaces getting into home that she has to pay attention to decrease occurrence of falls.      Patient Goals: Patient  would like to be able to run and exercise, move more, and be able to lift more than 40 lbs. Pt would ike to get back to dancing with a little more ease.    PRECAUTIONS: none    SUBJECTIVE:                                                                                                                                                                                      SUBJECTIVE STATEMENT: Pt reports some swelling in her feet with prolonged standing. She reports no significant thigh pain at arrival. She reports remaining knee pain. She reports feeling generally sore and using her cane a lot recently. Patient has completed move to her new home.     PAIN:  Are you having pain? No   OBJECTIVE: (objective measures completed at initial evaluation unless otherwise dated)   Patient Surveys  FOTO: Pt reported 105 (FOTO risk adjusted 46),predicted improvement to 5 ABC: deferred     Cognition Patient is oriented to person, place, and time.  Recent memory is intact.  Remote memory is intact.  Attention span and concentration are intact.  Expressive speech is intact.  Patient's fund of knowledge is within normal limits for educational level.                            Gross Musculoskeletal Assessment Tremor: None Bulk: Normal Tone: Normal     GAIT: Distance walked: deferred Assistive device utilized: Single point cane used for longer distances Level of assistance: Complete Independence Comments: lateral trunk sway, decreased step length, decreased R knee flexion in swing phase.     Posture: No gross abnormalities noted in standing or seated posture     AROM       AROM (Normal range in degrees) AROM  03/15/2022         Hip Right Left  Flexion (125) 100 110  Extension (15)      Abduction (40)      Adduction       Internal Rotation (45)      External  Rotation (45)             Knee      Flexion (135) 135* WNL  Extension (0) +3 +3         Ankle      Dorsiflexion  (20)      Plantarflexion (50)      Inversion (35)      Eversion (15      (* = pain; Blank rows = not tested)     PROM Hip flexion: R WNL, L WNL  Knee PROM WNL, pain with end-range R knee flexion      LE MMT:   MMT (out of 5) Right 03/15/2022 Left 03/15/2022 Right 04/26/22 Left 04/26/22  Hip flexion '4 4 4 4  '$ Hip extension        Hip abduction 3 3 4- 4-  Hip adduction (seated) 5 5    Hip internal rotation '3 3 4 3  '$ Hip external rotation '5 5   5 5  '$ Knee flexion '5 5 5 5  '$ Knee extension 3* 5 3* 5  Ankle dorsiflexion '5 5 5 5  '$ Ankle plantarflexion        Ankle inversion '4 5 5 5  '$ Ankle eversion        (* = pain; Blank rows = not tested)     Sensation Deferred     Reflexes Deferred       FUNCTIONAL OUTCOME MEASURES INITIAL EVAL   Results Comments  TUG 8.66 seconds    5TSTS Deferred due to deficit in sit to stand performance    (Blank rows = not tested)    TODAY'S TREATMENT     Therapeutic Exercise - improved strength as needed to improve performance of CKC activities/functional movements, for improved ability to perform stair negotiation and transferring  - NuStep Seat 10 handles 8 Resistance 4 for 5 minutes - for soft tissue warm-up to improve muscle performance and improve mobility   -subjective information gathered during this time   -High knee along blue agility ladder; 4x D/B   - Banded lateral stepping with Green Tband on agility ladder 1x4 D/B length of the agility ladder   -Total Gym single-limb minisquat; performed bilaterally for 3x8  - Sit-to-stand from chair; 2x10, with 4-lb Medball goblet hold     -Forward minilunge; 2x10, in // bars  -Unipedal stance, multiple attempts with 2 sets of 10-sec holds obtained on either LE      *not today* - Step-ups on 6 inch step and Airex pad; 2x10 bilateral  - used unilateral UE support and minimal cueing on holding single limb stance - single limb stance hold;  2x10 with 10 sec hold - Banded lateral  stepping with Red Theraband on agility ladder 2 D/B length of the agility ladder  - limited by fatigue and ache in knees - Mini squat in pain free range; 1x10  - forward mini lunge, adjacent to treadmill armrest as needed for balance; 1x10  - 3-way hip exercise with 4lbs. 3x12 bilateral - used unilateral UE support with minimal cueing for slow and controlled movements - side-lying hip abduction; 2x10 bilateral - prone hip extension; 2x10 bilateral - reverse clamshells; 2x10 bilateral - Total gym double-limb squat; 2x10  - moderate cueing on keeping heels on the platform and widening stance - Tandem stance on Airex: 4x1 minute (RLE behind and then switched to LLE)    PATIENT EDUCATION:  Education details: Pt educated on performing HEP for long term goals.  Person  educated: Patient Education method: Explanation Education comprehension: verbalized understanding     HOME EXERCISE PROGRAM: Access Code: [email or web address] URL: [email or web address] Date: 03/17/2022 Prepared by: Valentina Gu  Exercises - Supine Active Straight Leg Raise  - 2 x daily - 7 x weekly - 2 sets - 10 reps - Supine Bridge  - 2 x daily - 7 x weekly - 2 sets - 10 reps - Standing Anterior Toe Taps  - 2 x daily - 7 x weekly - 2 sets - 10 reps - Standing 3-Way Kick  - 2 x daily - 7 x weekly - 2 sets - 10 reps - Side Stepping with Counter Support  - 2 x daily - 7 x weekly - 2 sets - 10 reps - Mini Squat with Counter Support  - 2 x daily - 7 x weekly - 2 sets - 10 reps     ASSESSMENT:   CLINICAL IMPRESSION: Patient demonstrates mild abductor lurch bilaterally during stance phase. She has mild antalgic patten that may also be associated with comorbid low back pain. She has made notable progress since her initial episode of PT in 2023 with pt weaning down from [facility] to [facility] - she has been able to complete household mobility without AD. We continued today with progressive closed-chain exercise with pt tolerating  each progression well. Patient still has remaining deficits in LE strength, postural control on either LE, and variable thigh/knee pain with associated activity limitations in transferring, gait, and obstacle/stair/curb negotiation. Pt will continue to benefit from skilled PT services to address her noted deficits and improve function.     GOALS: Goals reviewed with patient? No   SHORT TERM GOALS: Target date: 04/15/22   Pt will be independent with HEP in order to improve strength and balance in order to decrease fall risk and improve function at home. Baseline: 03/15/22: pt HEP was reviewed for carryover effect.    04/26/22: pt has held off on doing HEP in the past couple of weeks with recently moving and latest episode of chronic low back pain.  Goal status: ON-GOING     LONG TERM GOALS: Target date: 05/13/22   Pt will increase FOTO to at least 61 to demonstrate significant improvement in function at home related to balance and pain. Baseline: 03/15/22: 60 (FOTO adjusted score of 46).   04/26/22: 55 Goal status: ON-GOING   2.  Pt will increase in R knee extension MMT strength for ability to tolerate standing/walking demands needed for negotiating community ambulation. Baseline: 03/15/22: 3*/5      04/26/22: 3*/5 Goal status: ON-GOING   3.  Pt will decrease time of performance of 5TSTS by 4 seconds to demonstrate improved ability to transfer to-and-from commode with decreased difficulty.  Baseline: 03/15/22: Unable to accomplish 5TSTS without arm rests    04/26/22: pt performed 5TSTS in 17.73 sec. Goal status: ON-GOING   4.  Pt will be able to independently perform climbing a flight of stairs with unilateral UE or less support and reciprocal pattern to demonstrate increased ability to navigate home staircase.  Baseline: 03/15/22: heavy upper extremity reliance for stair negotiation and having to perform step-to with her home's stair height.   04/26/22: moderate unilateral UE support with  step-to-step pattern when ascending and descending the stairs; pt has more UE reliance in descending staircase.  Goal status: ON-GOING     PLAN: PT FREQUENCY: 2x/week   PT DURATION: 4-6 weeks   PLANNED INTERVENTIONS: Therapeutic exercises, Therapeutic activity, Neuromuscular re-education, Balance training, Gait  training, Patient/Family education, Joint manipulation, Joint mobilization, Canalith repositioning, Aquatic Therapy, Dry Needling, Cognitive remediation, Electrical stimulation, Spinal manipulation, Spinal mobilization, Cryotherapy, Moist heat, Traction, Ultrasound, Ionotophoresis '4mg'$ /ml Dexamethasone, and Manual therapy   PLAN FOR NEXT SESSION: Abduction and quadriceps strengthening, sit-to-stand, internal rotation exercise, and update HEP for increase in difficulty appropriate to progress.     Valentina Gu, PT, DPT [phone] 05/10/2022  9:06 AM

## 2022-05-12 ENCOUNTER — Encounter: Payer: Self-pay | Admitting: Physical Therapy

## 2022-05-12 ENCOUNTER — Ambulatory Visit: Payer: Medicare HMO | Admitting: Physical Therapy

## 2022-05-12 DIAGNOSIS — R262 Difficulty in walking, not elsewhere classified: Secondary | ICD-10-CM

## 2022-05-12 DIAGNOSIS — M6281 Muscle weakness (generalized): Secondary | ICD-10-CM

## 2022-05-12 NOTE — Therapy (Unsigned)
OUTPATIENT PHYSICAL THERAPY TREATMENT  Patient Name: Kristin Lopez MRN: [id number] DOB:[birth year], 57 y.o., female Today's Date: 05/16/2022   END OF SESSION:   PT End of Session - 05/16/22 1659     Visit Number 12    Number of Visits 17    Date for PT Re-Evaluation 06/22/22    PT Start Time 0905    PT Stop Time 0945    PT Time Calculation (min) 40 min    Activity Tolerance Patient tolerated treatment well    Behavior During Therapy [facility] for tasks assessed/performed              Past Medical History:  Diagnosis Date   Hyperlipidemia    Past Surgical History:  Procedure Laterality Date   ABDOMINAL HYSTERECTOMY  2021   FEMUR FRACTURE SURGERY     FEMUR IM NAIL Left 02/03/2021   Procedure: INTRAMEDULLARY (IM) RETROGRADE FEMORAL EXCHANGE NAILING;  Surgeon: Shona Needles, MD;  Location: [place];  Service: Orthopedics;  Laterality: Left;   FEMUR IM NAIL Right 06/30/2021   Procedure: EXCHANGE NAILING FEMUR NONUNION;  Surgeon: Shona Needles, MD;  Location: [place];  Service: Orthopedics;  Laterality: Right;   HARDWARE REMOVAL Left 02/03/2021   Procedure: HARDWARE REMOVAL;  Surgeon: Shona Needles, MD;  Location: [place];  Service: Orthopedics;  Laterality: Left;   KNEE SURGERY     SUBMANDIBULAR GLAND EXCISION  1982   Patient Active Problem List   Diagnosis Date Noted   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 06/30/2021   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 02/03/2021   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 01/14/2021     PCP: none on file  REFERRING PROVIDER: Haddix, Thomasene Lot, MD   REFERRING DIAGNOSIS: S72.302K (ICD-10-CM) - Unspecified fracture of shaft of left femur, subsequent encounter for closed fracture with nonunion    REFERRING DIAG: S72.302K (ICD-10-CM) - Unspecified fracture of shaft of left femur, subsequent encounter for closed fracture with nonunion    THERAPY DIAG:  Difficulty in  walking, not elsewhere classified  Muscle weakness (generalized)  Rationale for Evaluation and Treatment Rehabilitation  PERTINENT HISTORY: Pt reports that she typically walks without use of single point cane and only uses it for long distances and shopping. Getting up from a chair and couch is hard without pushing off from arm rest or legs. (Independent sit to stand goal). Can't sleep on right side, because hardware in distal femur is uncomfortable in this position. Some buckling but less recently. Still can't get on her knees for those type of chores. Staying in one position for long periods of time causes them pain and after a long day of being on their feet causes more pain and fatigue. R LE tends to always be numb and has some tingling but less than previous visit.      Pain: Yes  R/L thigh/knee: Now/Best 3/10, Worst 5/10    R Ankle: Now/Best 0/10, Worst 7/10 (tends to improve with repeated motion) Numbness/Tingling: Yes Recent changes in overall health/medication: No Prior history of physical therapy for present condition: Yes, previous episode of care s/p R femoral exchange nailing Falls: Has patient fallen in last 6 months? Yes, Number of falls: 2 Directional pattern for falls: No   Imaging: Yes ,   X-ray 06/30/21:  IMPRESSION: Internal fixation of incompletely healed mid femoral shaft fracture, which is in near anatomic alignment.  Prior level of function: Independent and Independent with household mobility without device Occupational demands: -    Precautions: None   Weight Bearing Restrictions: No   Living Environment Lives with: lives with their family Lives in: House/apartment ; Pt reports having13 steps and uses railing. Has following equipment at home: Single point cane, grab bars in the shower, and raised commode over the toilet. Some uneven surfaces getting into home that she has to pay attention to decrease occurrence of falls.      Patient Goals: Patient  would like to be able to run and exercise, move more, and be able to lift more than 40 lbs. Pt would ike to get back to dancing with a little more ease.    PRECAUTIONS: none    SUBJECTIVE:                                                                                                                                                                                      SUBJECTIVE STATEMENT: Pt reports some swelling in her feet with prolonged standing. She reports no significant thigh pain at arrival. She reports remaining knee pain. She reports feeling generally sore and using her cane a lot recently. Patient has completed move to her new home.     PAIN:  Are you having pain? No   OBJECTIVE: (objective measures completed at initial evaluation unless otherwise dated)   Patient Surveys  FOTO: Pt reported 28 (FOTO risk adjusted 46),predicted improvement to 101 ABC: deferred     Cognition Patient is oriented to person, place, and time.  Recent memory is intact.  Remote memory is intact.  Attention span and concentration are intact.  Expressive speech is intact.  Patient's fund of knowledge is within normal limits for educational level.                            Gross Musculoskeletal Assessment Tremor: None Bulk: Normal Tone: Normal     GAIT: Distance walked: deferred Assistive device utilized: Single point cane used for longer distances Level of assistance: Complete Independence Comments: lateral trunk sway, decreased step length, decreased R knee flexion in swing phase.     Posture: No gross abnormalities noted in standing or seated posture     AROM       AROM (Normal range in degrees) AROM  03/15/2022         Hip Right Left  Flexion (125) 100 110  Extension (15)      Abduction (40)      Adduction       Internal Rotation (45)  External Rotation (45)             Knee      Flexion (135) 135* WNL  Extension (0) +3 +3         Ankle      Dorsiflexion  (20)      Plantarflexion (50)      Inversion (35)      Eversion (15      (* = pain; Blank rows = not tested)     PROM Hip flexion: R WNL, L WNL  Knee PROM WNL, pain with end-range R knee flexion      LE MMT:   MMT (out of 5) Right 03/15/2022 Left 03/15/2022 Right 04/26/22 Left 04/26/22  Hip flexion 4 4 4 4   Hip extension        Hip abduction 3 3 4- 4-  Hip adduction (seated) 5 5    Hip internal rotation 3 3 4 3   Hip external rotation 5 5   5 5   Knee flexion 5 5 5 5   Knee extension 3* 5 3* 5  Ankle dorsiflexion 5 5 5 5   Ankle plantarflexion        Ankle inversion 4 5 5 5   Ankle eversion        (* = pain; Blank rows = not tested)     Sensation Deferred     Reflexes Deferred       FUNCTIONAL OUTCOME MEASURES INITIAL EVAL   Results Comments  TUG 8.66 seconds    5TSTS Deferred due to deficit in sit to stand performance    (Blank rows = not tested)    TODAY'S TREATMENT     Therapeutic Exercise - improved strength as needed to improve performance of CKC activities/functional movements, for improved ability to perform stair negotiation and transferring  - NuStep Seat 10 handles 8 Resistance 4 for 5 minutes - for soft tissue warm-up to improve muscle performance and improve mobility   -subjective information gathered during this time   -High knee along blue agility ladder; 5x D/B   - Banded lateral stepping with Blue Tband on agility ladder 1x5 D/B length of the agility ladder   -Total Gym single-limb minisquat; performed bilaterally for 3x8  - Sit-to-stand from chair; 2x10, with 4-lb Medball goblet hold    -Forward minilunge; 2x10, in // bars  -Unipedal stance, multiple attempts with 2 sets of 10-sec holds obtained on either LE  -Tandem stance on Airex; 2x30 sec with each LE posterior    *not today* - Step-ups on 6 inch step and Airex pad; 2x10 bilateral  - used unilateral UE support and minimal cueing on holding single limb stance - single limb  stance hold;  2x10 with 10 sec hold - Banded lateral stepping with Red Theraband on agility ladder 2 D/B length of the agility ladder  - limited by fatigue and ache in knees - Mini squat in pain free range; 1x10  - forward mini lunge, adjacent to treadmill armrest as needed for balance; 1x10  - 3-way hip exercise with 4lbs. 3x12 bilateral - used unilateral UE support with minimal cueing for slow and controlled movements - side-lying hip abduction; 2x10 bilateral - prone hip extension; 2x10 bilateral - reverse clamshells; 2x10 bilateral - Total gym double-limb squat; 2x10  - moderate cueing on keeping heels on the platform and widening stance - Tandem stance on Airex: 4x1 minute (RLE behind and then switched to LLE)    PATIENT EDUCATION:  Education details: Pt educated  on performing HEP for long term goals.  Person educated: Patient Education method: Explanation Education comprehension: verbalized understanding     HOME EXERCISE PROGRAM: Access Code: [email or web address] URL: [email or web address] Date: 03/17/2022 Prepared by: Valentina Gu  Exercises - Supine Active Straight Leg Raise  - 2 x daily - 7 x weekly - 2 sets - 10 reps - Supine Bridge  - 2 x daily - 7 x weekly - 2 sets - 10 reps - Standing Anterior Toe Taps  - 2 x daily - 7 x weekly - 2 sets - 10 reps - Standing 3-Way Kick  - 2 x daily - 7 x weekly - 2 sets - 10 reps - Side Stepping with Counter Support  - 2 x daily - 7 x weekly - 2 sets - 10 reps - Mini Squat with Counter Support  - 2 x daily - 7 x weekly - 2 sets - 10 reps     ASSESSMENT:   CLINICAL IMPRESSION: Patient demonstrates mild abductor lurch bilaterally during stance phase. She has mild antalgic patten that may also be associated with comorbid low back pain. She has made notable progress since her initial episode of PT in 2023 with pt weaning down from [facility] to [facility] - she has been able to complete household mobility without AD. We continued today with  progressive closed-chain exercise with pt tolerating each progression well. Patient still has remaining deficits in LE strength, postural control on either LE, and variable thigh/knee pain with associated activity limitations in transferring, gait, and obstacle/stair/curb negotiation. Pt will continue to benefit from skilled PT services to address her noted deficits and improve function.     GOALS: Goals reviewed with patient? No   SHORT TERM GOALS: Target date: 04/15/22   Pt will be independent with HEP in order to improve strength and balance in order to decrease fall risk and improve function at home. Baseline: 03/15/22: pt HEP was reviewed for carryover effect.    04/26/22: pt has held off on doing HEP in the past couple of weeks with recently moving and latest episode of chronic low back pain.  Goal status: ON-GOING     LONG TERM GOALS: Target date: 05/13/22   Pt will increase FOTO to at least 61 to demonstrate significant improvement in function at home related to balance and pain. Baseline: 03/15/22: 60 (FOTO adjusted score of 46).   04/26/22: 55 Goal status: ON-GOING   2.  Pt will increase in R knee extension MMT strength for ability to tolerate standing/walking demands needed for negotiating community ambulation. Baseline: 03/15/22: 3*/5      04/26/22: 3*/5 Goal status: ON-GOING   3.  Pt will decrease time of performance of 5TSTS by 4 seconds to demonstrate improved ability to transfer to-and-from commode with decreased difficulty.  Baseline: 03/15/22: Unable to accomplish 5TSTS without arm rests    04/26/22: pt performed 5TSTS in 17.73 sec. Goal status: ON-GOING   4.  Pt will be able to independently perform climbing a flight of stairs with unilateral UE or less support and reciprocal pattern to demonstrate increased ability to navigate home staircase.  Baseline: 03/15/22: heavy upper extremity reliance for stair negotiation and having to perform step-to with her home's stair height.    04/26/22: moderate unilateral UE support with step-to-step pattern when ascending and descending the stairs; pt has more UE reliance in descending staircase.  Goal status: ON-GOING     PLAN: PT FREQUENCY: 2x/week   PT DURATION: 4-6 weeks   PLANNED INTERVENTIONS:  Therapeutic exercises, Therapeutic activity, Neuromuscular re-education, Balance training, Gait training, Patient/Family education, Joint manipulation, Joint mobilization, Canalith repositioning, Aquatic Therapy, Dry Needling, Cognitive remediation, Electrical stimulation, Spinal manipulation, Spinal mobilization, Cryotherapy, Moist heat, Traction, Ultrasound, Ionotophoresis 4mg /ml Dexamethasone, and Manual therapy   PLAN FOR NEXT SESSION: Abduction and quadriceps strengthening, sit-to-stand, internal rotation exercise, and update HEP for increase in difficulty appropriate to progress.     Valentina Gu, PT, DPT [phone] 05/16/2022  4:59 PM

## 2022-05-17 ENCOUNTER — Ambulatory Visit: Payer: Medicare HMO | Admitting: Physical Therapy

## 2022-05-17 ENCOUNTER — Encounter: Payer: Self-pay | Admitting: Physical Therapy

## 2022-05-17 DIAGNOSIS — R262 Difficulty in walking, not elsewhere classified: Secondary | ICD-10-CM

## 2022-05-17 DIAGNOSIS — M6281 Muscle weakness (generalized): Secondary | ICD-10-CM

## 2022-05-17 NOTE — Therapy (Unsigned)
OUTPATIENT PHYSICAL THERAPY TREATMENT  Patient Name: Kristin Lopez MRN: [id number] DOB:1966/02/05, 57 y.o., female Today's Date: 05/17/2022   END OF SESSION:   PT End of Session - 05/17/22 0909     Visit Number 13    Number of Visits 17    Date for PT Re-Evaluation 06/22/22    PT Start Time 0904    PT Stop Time 0944    PT Time Calculation (min) 40 min    Activity Tolerance Patient tolerated treatment well    Behavior During Therapy [facility] for tasks assessed/performed               Past Medical History:  Diagnosis Date   Hyperlipidemia    Past Surgical History:  Procedure Laterality Date   ABDOMINAL HYSTERECTOMY  2021   FEMUR FRACTURE SURGERY     FEMUR IM NAIL Left 02/03/2021   Procedure: INTRAMEDULLARY (IM) RETROGRADE FEMORAL EXCHANGE NAILING;  Surgeon: Shona Needles, MD;  Location: [place];  Service: Orthopedics;  Laterality: Left;   FEMUR IM NAIL Right 06/30/2021   Procedure: EXCHANGE NAILING FEMUR NONUNION;  Surgeon: Shona Needles, MD;  Location: [place];  Service: Orthopedics;  Laterality: Right;   HARDWARE REMOVAL Left 02/03/2021   Procedure: HARDWARE REMOVAL;  Surgeon: Shona Needles, MD;  Location: [place];  Service: Orthopedics;  Laterality: Left;   KNEE SURGERY     SUBMANDIBULAR GLAND EXCISION  1982   Patient Active Problem List   Diagnosis Date Noted   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 06/30/2021   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 02/03/2021   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 01/14/2021     PCP: none on file  REFERRING PROVIDER: Haddix, Thomasene Lot, MD   REFERRING DIAGNOSIS: S72.302K (ICD-10-CM) - Unspecified fracture of shaft of left femur, subsequent encounter for closed fracture with nonunion    REFERRING DIAG: S72.302K (ICD-10-CM) - Unspecified fracture of shaft of left femur, subsequent encounter for closed fracture with nonunion    THERAPY DIAG:  Difficulty  in walking, not elsewhere classified  Muscle weakness (generalized)  Rationale for Evaluation and Treatment Rehabilitation  PERTINENT HISTORY: Pt reports that she typically walks without use of single point cane and only uses it for long distances and shopping. Getting up from a chair and couch is hard without pushing off from arm rest or legs. (Independent sit to stand goal). Can't sleep on right side, because hardware in distal femur is uncomfortable in this position. Some buckling but less recently. Still can't get on her knees for those type of chores. Staying in one position for long periods of time causes them pain and after a long day of being on their feet causes more pain and fatigue. R LE tends to always be numb and has some tingling but less than previous visit.      Pain: Yes  R/L thigh/knee: Now/Best 3/10, Worst 5/10    R Ankle: Now/Best 0/10, Worst 7/10 (tends to improve with repeated motion) Numbness/Tingling: Yes Recent changes in overall health/medication: No Prior history of physical therapy for present condition: Yes, previous episode of care s/p R femoral exchange nailing Falls: Has patient fallen in last 6 months? Yes, Number of falls: 2 Directional pattern for falls: No   Imaging: Yes ,   X-ray 06/30/21:  IMPRESSION: Internal fixation of incompletely healed mid femoral shaft fracture, which is in near anatomic alignment.  Prior level of function: Independent and Independent with household mobility without device Occupational demands: -    Precautions: None   Weight Bearing Restrictions: No   Living Environment Lives with: lives with their family Lives in: House/apartment ; Pt reports having13 steps and uses railing. Has following equipment at home: Single point cane, grab bars in the shower, and raised commode over the toilet. Some uneven surfaces getting into home that she has to pay attention to decrease occurrence of falls.      Patient Goals: Patient  would like to be able to run and exercise, move more, and be able to lift more than 40 lbs. Pt would ike to get back to dancing with a little more ease.    PRECAUTIONS: none    SUBJECTIVE:                                                                                                                                                                                      SUBJECTIVE STATEMENT: Pt reports no remarkable thigh or hip/knee pain at arrival. Pt reports having intermittent R ankle pain along medial malleolus region. She reports doing well with exercises for this discussed last visit.     PAIN:  Are you having pain? No   OBJECTIVE: (objective measures completed at initial evaluation unless otherwise dated)   Patient Surveys  FOTO: Pt reported 51 (FOTO risk adjusted 46),predicted improvement to 56 ABC: deferred     Cognition Patient is oriented to person, place, and time.  Recent memory is intact.  Remote memory is intact.  Attention span and concentration are intact.  Expressive speech is intact.  Patient's fund of knowledge is within normal limits for educational level.                            Gross Musculoskeletal Assessment Tremor: None Bulk: Normal Tone: Normal     GAIT: Distance walked: deferred Assistive device utilized: Single point cane used for longer distances Level of assistance: Complete Independence Comments: lateral trunk sway, decreased step length, decreased R knee flexion in swing phase.     Posture: No gross abnormalities noted in standing or seated posture     AROM       AROM (Normal range in degrees) AROM  03/15/2022         Hip Right Left  Flexion (125) 100 110  Extension (15)      Abduction (40)      Adduction       Internal Rotation (45)      External Rotation (45)  Knee      Flexion (135) 135* WNL  Extension (0) +3 +3         Ankle      Dorsiflexion (20)      Plantarflexion (50)      Inversion (35)       Eversion (15      (* = pain; Blank rows = not tested)     PROM Hip flexion: R WNL, L WNL  Knee PROM WNL, pain with end-range R knee flexion      LE MMT:   MMT (out of 5) Right 03/15/2022 Left 03/15/2022 Right 04/26/22 Left 04/26/22  Hip flexion 4 4 4 4   Hip extension        Hip abduction 3 3 4- 4-  Hip adduction (seated) 5 5    Hip internal rotation 3 3 4 3   Hip external rotation 5 5   5 5   Knee flexion 5 5 5 5   Knee extension 3* 5 3* 5  Ankle dorsiflexion 5 5 5 5   Ankle plantarflexion        Ankle inversion 4 5 5 5   Ankle eversion        (* = pain; Blank rows = not tested)     Sensation Deferred     Reflexes Deferred       FUNCTIONAL OUTCOME MEASURES INITIAL EVAL   Results Comments  TUG 8.66 seconds    5TSTS Deferred due to deficit in sit to stand performance    (Blank rows = not tested)    TODAY'S TREATMENT     Therapeutic Exercise - improved strength as needed to improve performance of CKC activities/functional movements, for improved ability to perform stair negotiation and transferring  - NuStep Seat 10 handles 8 Resistance 4 for 5 minutes - for soft tissue warm-up to improve muscle performance and improve mobility   -subjective information gathered during this time   -High knee along blue agility ladder; 5x D/B   - Banded lateral stepping with Blue Tband on agility ladder 1x5 D/B length of the agility ladder   -Total Gym single-limb minisquat; performed bilaterally for 3x8  - Sit-to-stand from chair; 2x10, with 4-lb Medball goblet hold    -Tandem stance on Airex; 2x30 sec with each LE posterior  -Long Airex Tandem walk; 2x D/B with multiple touches on armrest due to LOB  -Tandem walk on blue line (on agility ladder); multiple attempts with difficulty maintaining balance    *next visit*  -Forward minilunge; 2x10, in // bars   *not today* -Unipedal stance, multiple attempts with 2 sets of 10-sec holds obtained on either LE - Step-ups on 6  inch step and Airex pad; 2x10 bilateral  - used unilateral UE support and minimal cueing on holding single limb stance - single limb stance hold;  2x10 with 10 sec hold - Banded lateral stepping with Red Theraband on agility ladder 2 D/B length of the agility ladder  - limited by fatigue and ache in knees - Mini squat in pain free range; 1x10  - forward mini lunge, adjacent to treadmill armrest as needed for balance; 1x10  - 3-way hip exercise with 4lbs. 3x12 bilateral - used unilateral UE support with minimal cueing for slow and controlled movements - side-lying hip abduction; 2x10 bilateral - prone hip extension; 2x10 bilateral - reverse clamshells; 2x10 bilateral - Total gym double-limb squat; 2x10  - moderate cueing on keeping heels on the platform and widening stance - Tandem stance on Airex: 4x1  minute (RLE behind and then switched to LLE)    PATIENT EDUCATION:  Education details: Discussed role of isometrics and progressive exercise and ankle stabilization to address medial ankle pain. Discussed use of ice prn for R medial ankle. Recommended f/u with MD if ankle condition is not improving.  Person educated: Patient Education method: Explanation Education comprehension: verbalized understanding     HOME EXERCISE PROGRAM: Access Code: [email or web address] URL: [email or web address] Date: 03/17/2022 Prepared by: Valentina Gu  Exercises - Supine Active Straight Leg Raise  - 2 x daily - 7 x weekly - 2 sets - 10 reps - Supine Bridge  - 2 x daily - 7 x weekly - 2 sets - 10 reps - Standing Anterior Toe Taps  - 2 x daily - 7 x weekly - 2 sets - 10 reps - Standing 3-Way Kick  - 2 x daily - 7 x weekly - 2 sets - 10 reps - Side Stepping with Counter Support  - 2 x daily - 7 x weekly - 2 sets - 10 reps - Mini Squat with Counter Support  - 2 x daily - 7 x weekly - 2 sets - 10 reps     ASSESSMENT:   CLINICAL IMPRESSION: Patient tolerates additional balance/stability drills  well today without notable pain in lower extremities. She has comorbid R ankle pain that is intermittently reproduced with weightbearing activity/stepping onto R foot. 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 We will continue following up on this condition and may need to refer to MD if it is not improving. Pt is overall progressing well with LE strengthening; she does have remaining compensated Trendelenburg and difficulty with stair/curb negotiation necessitating further PT intervention. Patient still has remaining deficits in LE strength, postural control on either LE, and variable thigh/knee pain with associated activity limitations in transferring, gait, and obstacle/stair/curb negotiation. Pt will continue to benefit from skilled PT services to address her noted deficits and improve function.     GOALS: Goals reviewed with patient? No   SHORT TERM GOALS: Target date: 04/15/22   Pt will be independent with HEP in order to improve strength and balance in order to decrease fall risk and improve function at home. Baseline: 03/15/22: pt HEP was reviewed for carryover effect.    04/26/22: pt has held off on doing HEP in the past couple of weeks with recently moving and latest episode of chronic low back pain.  Goal status: ON-GOING     LONG TERM GOALS: Target date: 05/13/22   Pt will increase FOTO to at least 61 to demonstrate significant improvement in function at home related to balance and pain. Baseline: 03/15/22: 60 (FOTO adjusted score of 46).   04/26/22: 55 Goal status: ON-GOING   2.  Pt will increase in R knee extension MMT strength for ability to tolerate standing/walking demands needed for negotiating community ambulation. Baseline: 03/15/22: 3*/5      04/26/22: 3*/5 Goal status: ON-GOING   3.  Pt will decrease  time of performance of 5TSTS by 4 seconds to demonstrate improved ability to transfer to-and-from commode with decreased difficulty.  Baseline: 03/15/22: Unable to accomplish 5TSTS without arm rests    04/26/22: pt performed 5TSTS in 17.73 sec. Goal status: ON-GOING   4.  Pt will be able to independently perform climbing a flight of stairs with unilateral UE or less  support and reciprocal pattern to demonstrate increased ability to navigate home staircase.  Baseline: 03/15/22: heavy upper extremity reliance for stair negotiation and having to perform step-to with her home's stair height.   04/26/22: moderate unilateral UE support with step-to-step pattern when ascending and descending the stairs; pt has more UE reliance in descending staircase.  Goal status: ON-GOING     PLAN: PT FREQUENCY: 2x/week   PT DURATION: 4-6 weeks   PLANNED INTERVENTIONS: Therapeutic exercises, Therapeutic activity, Neuromuscular re-education, Balance training, Gait training, Patient/Family education, Joint manipulation, Joint mobilization, Canalith repositioning, Aquatic Therapy, Dry Needling, Cognitive remediation, Electrical stimulation, Spinal manipulation, Spinal mobilization, Cryotherapy, Moist heat, Traction, Ultrasound, Ionotophoresis 4mg /ml Dexamethasone, and Manual therapy   PLAN FOR NEXT SESSION: Abduction and quadriceps strengthening, sit-to-stand, internal rotation exercise, and update HEP for increase in difficulty appropriate to progress.     Valentina Gu, PT, DPT [phone] 05/17/2022  9:10 AM

## 2022-05-19 ENCOUNTER — Ambulatory Visit: Payer: Medicare HMO | Admitting: Physical Therapy

## 2022-05-19 DIAGNOSIS — R262 Difficulty in walking, not elsewhere classified: Secondary | ICD-10-CM | POA: Diagnosis not present

## 2022-05-19 DIAGNOSIS — M6281 Muscle weakness (generalized): Secondary | ICD-10-CM

## 2022-05-19 NOTE — Therapy (Signed)
OUTPATIENT PHYSICAL THERAPY TREATMENT  Patient Name: Kristin Lopez MRN: [id number] DOB:May 09, 1965, 57 y.o., female Today's Date: 05/19/2022   END OF SESSION:   PT End of Session - 05/23/22 0846     Visit Number 14    Number of Visits 17    Date for PT Re-Evaluation 06/22/22    PT Start Time 0905    PT Stop Time 0950    PT Time Calculation (min) 45 min    Activity Tolerance Patient tolerated treatment well    Behavior During Therapy [facility] for tasks assessed/performed                Past Medical History:  Diagnosis Date   Hyperlipidemia    Past Surgical History:  Procedure Laterality Date   ABDOMINAL HYSTERECTOMY  2021   FEMUR FRACTURE SURGERY     FEMUR IM NAIL Left 02/03/2021   Procedure: INTRAMEDULLARY (IM) RETROGRADE FEMORAL EXCHANGE NAILING;  Surgeon: Shona Needles, MD;  Location: [place];  Service: Orthopedics;  Laterality: Left;   FEMUR IM NAIL Right 06/30/2021   Procedure: EXCHANGE NAILING FEMUR NONUNION;  Surgeon: Shona Needles, MD;  Location: [place];  Service: Orthopedics;  Laterality: Right;   HARDWARE REMOVAL Left 02/03/2021   Procedure: HARDWARE REMOVAL;  Surgeon: Shona Needles, MD;  Location: [place];  Service: Orthopedics;  Laterality: Left;   KNEE SURGERY     SUBMANDIBULAR GLAND EXCISION  1982   Patient Active Problem List   Diagnosis Date Noted   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 06/30/2021   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 02/03/2021   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 01/14/2021     PCP: none on file  REFERRING PROVIDER: Haddix, Thomasene Lot, MD   REFERRING DIAGNOSIS: S72.302K (ICD-10-CM) - Unspecified fracture of shaft of left femur, subsequent encounter for closed fracture with nonunion    REFERRING DIAG: S72.302K (ICD-10-CM) - Unspecified fracture of shaft of left femur, subsequent encounter for closed fracture with nonunion    THERAPY DIAG:  Difficulty  in walking, not elsewhere classified  Muscle weakness (generalized)  Rationale for Evaluation and Treatment Rehabilitation  PERTINENT HISTORY: Pt reports that she typically walks without use of single point cane and only uses it for long distances and shopping. Getting up from a chair and couch is hard without pushing off from arm rest or legs. (Independent sit to stand goal). Can't sleep on right side, because hardware in distal femur is uncomfortable in this position. Some buckling but less recently. Still can't get on her knees for those type of chores. Staying in one position for long periods of time causes them pain and after a long day of being on their feet causes more pain and fatigue. R LE tends to always be numb and has some tingling but less than previous visit.      Pain: Yes  R/L thigh/knee: Now/Best 3/10, Worst 5/10    R Ankle: Now/Best 0/10, Worst 7/10 (tends to improve with repeated motion) Numbness/Tingling: Yes Recent changes in overall health/medication: No Prior history of physical therapy for present condition: Yes, previous episode of care s/p R femoral exchange nailing Falls: Has patient fallen in last 6 months? Yes, Number of falls: 2 Directional pattern for falls: No   Imaging: Yes ,   X-ray 06/30/21:  IMPRESSION: Internal fixation of incompletely healed mid femoral shaft fracture, which is in near anatomic alignment.  Prior level of function: Independent and Independent with household mobility without device Occupational demands: -    Precautions: None   Weight Bearing Restrictions: No   Living Environment Lives with: lives with their family Lives in: House/apartment ; Pt reports having13 steps and uses railing. Has following equipment at home: Single point cane, grab bars in the shower, and raised commode over the toilet. Some uneven surfaces getting into home that she has to pay attention to decrease occurrence of falls.      Patient Goals: Patient  would like to be able to run and exercise, move more, and be able to lift more than 40 lbs. Pt would ike to get back to dancing with a little more ease.    PRECAUTIONS: none    SUBJECTIVE:                                                                                                                                                                                      SUBJECTIVE STATEMENT: Pt reports feeling generally well at arrival. She did have a hard time getting to sleep last night - no disturbed sleep due to pain. Patient reports her R ankle is "not bad" this AM. She had some episodes of her R knee buckling yesterday.     PAIN:  Are you having pain? No   OBJECTIVE: (objective measures completed at initial evaluation unless otherwise dated)   Patient Surveys  FOTO: Pt reported 67 (FOTO risk adjusted 46),predicted improvement to 54 ABC: deferred     Cognition Patient is oriented to person, place, and time.  Recent memory is intact.  Remote memory is intact.  Attention span and concentration are intact.  Expressive speech is intact.  Patient's fund of knowledge is within normal limits for educational level.                            Gross Musculoskeletal Assessment Tremor: None Bulk: Normal Tone: Normal     GAIT: Distance walked: deferred Assistive device utilized: Single point cane used for longer distances Level of assistance: Complete Independence Comments: lateral trunk sway, decreased step length, decreased R knee flexion in swing phase.     Posture: No gross abnormalities noted in standing or seated posture     AROM       AROM (Normal range in degrees) AROM  03/15/2022         Hip Right Left  Flexion (125) 100 110  Extension (15)      Abduction (40)      Adduction       Internal Rotation (45)  External Rotation (45)             Knee      Flexion (135) 135* WNL  Extension (0) +3 +3         Ankle      Dorsiflexion (20)       Plantarflexion (50)      Inversion (35)      Eversion (15      (* = pain; Blank rows = not tested)     PROM Hip flexion: R WNL, L WNL  Knee PROM WNL, pain with end-range R knee flexion      LE MMT:   MMT (out of 5) Right 03/15/2022 Left 03/15/2022 Right 04/26/22 Left 04/26/22  Hip flexion 4 4 4 4   Hip extension        Hip abduction 3 3 4- 4-  Hip adduction (seated) 5 5    Hip internal rotation 3 3 4 3   Hip external rotation 5 5   5 5   Knee flexion 5 5 5 5   Knee extension 3* 5 3* 5  Ankle dorsiflexion 5 5 5 5   Ankle plantarflexion        Ankle inversion 4 5 5 5   Ankle eversion        (* = pain; Blank rows = not tested)     Sensation Deferred     Reflexes Deferred       FUNCTIONAL OUTCOME MEASURES INITIAL EVAL   Results Comments  TUG 8.66 seconds    5TSTS Deferred due to deficit in sit to stand performance    (Blank rows = not tested)    TODAY'S TREATMENT     Therapeutic Exercise - improved strength as needed to improve performance of CKC activities/functional movements, for improved ability to perform stair negotiation and transferring  - NuStep Seat 10 handles 8 Resistance 5 for 6 minutes - for soft tissue warm-up to improve muscle performance and improve mobility   -subjective information gathered during this time intermittently, 2 minutes not billed   -High knee along blue agility ladder; 5x D/B   -Toe tap with mirror feedback to reduce compensatory lateral flexion; 2x10 alternating   - Banded lateral stepping with Blue Tband on agility ladder 1x5 D/B length of the agility ladder   -Total Gym single-limb minisquat; performed bilaterally for 3x8  - Sit-to-stand from chair; 2x10, with 4-lb Medball goblet hold    -Tandem stance on Airex; 2x30 sec with each LE posterior  -forward step up with contralateral LE tap on second step; 2x10    *next visit*  -Forward minilunge; 2x10, in // bars    *not today* -Tandem walk on blue line (on agility  ladder); multiple attempts with difficulty maintaining balance -Long Airex Tandem walk; 2x D/B with multiple touches on armrest due to LOB -Unipedal stance, multiple attempts with 2 sets of 10-sec holds obtained on either LE - Step-ups on 6 inch step and Airex pad; 2x10 bilateral  - used unilateral UE support and minimal cueing on holding single limb stance - single limb stance hold;  2x10 with 10 sec hold - Banded lateral stepping with Red Theraband on agility ladder 2 D/B length of the agility ladder  - limited by fatigue and ache in knees - Mini squat in pain free range; 1x10  - forward mini lunge, adjacent to treadmill armrest as needed for balance; 1x10  - 3-way hip exercise with 4lbs. 3x12 bilateral - used unilateral UE support with minimal cueing for slow  and controlled movements - side-lying hip abduction; 2x10 bilateral - prone hip extension; 2x10 bilateral - reverse clamshells; 2x10 bilateral - Total gym double-limb squat; 2x10  - moderate cueing on keeping heels on the platform and widening stance - Tandem stance on Airex: 4x1 minute (RLE behind and then switched to LLE)    PATIENT EDUCATION:  Education details: Discussed current PT progress and role of PT moving forward Person educated: Patient Education method: Explanation Education comprehension: verbalized understanding     HOME EXERCISE PROGRAM: Access Code: [email or web address] URL: [email or web address] Date: 03/17/2022 Prepared by: Valentina Gu  Exercises - Supine Active Straight Leg Raise  - 2 x daily - 7 x weekly - 2 sets - 10 reps - Supine Bridge  - 2 x daily - 7 x weekly - 2 sets - 10 reps - Standing Anterior Toe Taps  - 2 x daily - 7 x weekly - 2 sets - 10 reps - Standing 3-Way Kick  - 2 x daily - 7 x weekly - 2 sets - 10 reps - Side Stepping with Counter Support  - 2 x daily - 7 x weekly - 2 sets - 10 reps - Mini Squat with Counter Support  - 2 x daily - 7 x weekly - 2 sets - 10 reps      ASSESSMENT:   CLINICAL IMPRESSION: Patient is improving in ability to perform reciprocal stair climbing pattern, though she is still demonstrating notable gait deficits that may also be partially attributed to comorbid back pain with episodic flare-ups (primarily with compensated Trendelenburg/ipsilateral lateral flexion of trunk during stance phase). Pt is able to decrease this compensatory pattern with mirror feedback today. Pt is making good progress today, but she is still challenged with transferring and stair negotiation. Patient still has remaining deficits in LE strength, postural control on either LE, and variable thigh/knee pain with associated activity limitations in transferring, gait, and obstacle/stair/curb negotiation. Pt will continue to benefit from skilled PT services to address her noted deficits and improve function.     GOALS: Goals reviewed with patient? No   SHORT TERM GOALS: Target date: 04/15/22   Pt will be independent with HEP in order to improve strength and balance in order to decrease fall risk and improve function at home. Baseline: 03/15/22: pt HEP was reviewed for carryover effect.    04/26/22: pt has held off on doing HEP in the past couple of weeks with recently moving and latest episode of chronic low back pain.  Goal status: ON-GOING     LONG TERM GOALS: Target date: 05/13/22   Pt will increase FOTO to at least 61 to demonstrate significant improvement in function at home related to balance and pain. Baseline: 03/15/22: 60 (FOTO adjusted score of 46).   04/26/22: 55 Goal status: ON-GOING   2.  Pt will increase in R knee extension MMT strength for ability to tolerate standing/walking demands needed for negotiating community ambulation. Baseline: 03/15/22: 3*/5      04/26/22: 3*/5 Goal status: ON-GOING   3.  Pt will decrease time of performance of 5TSTS by 4 seconds to demonstrate improved ability to transfer to-and-from commode with decreased difficulty.   Baseline: 03/15/22: Unable to accomplish 5TSTS without arm rests    04/26/22: pt performed 5TSTS in 17.73 sec. Goal status: ON-GOING   4.  Pt will be able to independently perform climbing a flight of stairs with unilateral UE or less support and reciprocal pattern to demonstrate increased ability to navigate home staircase.  Baseline: 03/15/22: heavy upper extremity reliance for stair negotiation and having to perform step-to with her home's stair height.   04/26/22: moderate unilateral UE support with step-to-step pattern when ascending and descending the stairs; pt has more UE reliance in descending staircase.  Goal status: ON-GOING     PLAN: PT FREQUENCY: 2x/week   PT DURATION: 4-6 weeks   PLANNED INTERVENTIONS: Therapeutic exercises, Therapeutic activity, Neuromuscular re-education, Balance training, Gait training, Patient/Family education, Joint manipulation, Joint mobilization, Canalith repositioning, Aquatic Therapy, Dry Needling, Cognitive remediation, Electrical stimulation, Spinal manipulation, Spinal mobilization, Cryotherapy, Moist heat, Traction, Ultrasound, Ionotophoresis 4mg /ml Dexamethasone, and Manual therapy   PLAN FOR NEXT SESSION: Abduction and quadriceps strengthening, sit-to-stand, internal rotation exercise, and update HEP for increase in difficulty appropriate to progress.     Valentina Gu, PT, DPT [phone] 05/23/2022  8:46 AM

## 2022-05-23 ENCOUNTER — Encounter: Payer: Self-pay | Admitting: Physical Therapy

## 2022-05-24 ENCOUNTER — Ambulatory Visit: Payer: Medicare HMO | Admitting: Physical Therapy

## 2022-05-24 NOTE — Therapy (Deleted)
OUTPATIENT PHYSICAL THERAPY TREATMENT AND PROGRESS NOTE   Dates of reporting period  ***   to   ***   Patient Name: Kristin Lopez MRN: [id number] DOB:1966-02-06, 57 y.o., female Today's Date: 05/19/2022   END OF SESSION:        Past Medical History:  Diagnosis Date   Hyperlipidemia    Past Surgical History:  Procedure Laterality Date   ABDOMINAL HYSTERECTOMY  2021   FEMUR FRACTURE SURGERY     FEMUR IM NAIL Left 02/03/2021   Procedure: INTRAMEDULLARY (IM) RETROGRADE FEMORAL EXCHANGE NAILING;  Surgeon: Shona Needles, MD;  Location: [place];  Service: Orthopedics;  Laterality: Left;   FEMUR IM NAIL Right 06/30/2021   Procedure: EXCHANGE NAILING FEMUR NONUNION;  Surgeon: Shona Needles, MD;  Location: [place];  Service: Orthopedics;  Laterality: Right;   HARDWARE REMOVAL Left 02/03/2021   Procedure: HARDWARE REMOVAL;  Surgeon: Shona Needles, MD;  Location: [place];  Service: Orthopedics;  Laterality: Left;   KNEE SURGERY     SUBMANDIBULAR GLAND EXCISION  1982   Patient Active Problem List   Diagnosis Date Noted   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 06/30/2021   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 02/03/2021   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 01/14/2021     PCP: none on file  REFERRING PROVIDER: Haddix, Thomasene Lot, MD   REFERRING DIAGNOSIS: S72.302K (ICD-10-CM) - Unspecified fracture of shaft of left femur, subsequent encounter for closed fracture with nonunion    REFERRING DIAG: S72.302K (ICD-10-CM) - Unspecified fracture of shaft of left femur, subsequent encounter for closed fracture with nonunion    THERAPY DIAG:  Difficulty in walking, not elsewhere classified  Muscle weakness (generalized)  Rationale for Evaluation and Treatment Rehabilitation  PERTINENT HISTORY: Pt reports that she typically walks without use of single point cane and only uses it for long distances and  shopping. Getting up from a chair and couch is hard without pushing off from arm rest or legs. (Independent sit to stand goal). Can't sleep on right side, because hardware in distal femur is uncomfortable in this position. Some buckling but less recently. Still can't get on her knees for those type of chores. Staying in one position for long periods of time causes them pain and after a long day of being on their feet causes more pain and fatigue. R LE tends to always be numb and has some tingling but less than previous visit.      Pain: Yes  R/L thigh/knee: Now/Best 3/10, Worst 5/10    R Ankle: Now/Best 0/10, Worst 7/10 (tends to improve with repeated motion) Numbness/Tingling: Yes Recent changes in overall health/medication: No Prior history of physical therapy for present condition: Yes, previous episode of care s/p R femoral exchange nailing Falls: Has patient fallen in last 6 months? Yes, Number of falls: 2 Directional pattern for falls: No   Imaging: Yes ,   X-ray 06/30/21:  IMPRESSION: Internal fixation of incompletely healed mid femoral shaft fracture, which is in near anatomic alignment.     Prior level of function: Independent and Independent with household mobility without device Occupational demands: -    Precautions: None   Weight Bearing Restrictions: No   Living Environment Lives with: lives with their family Lives in: House/apartment ; Pt reports having13 steps and uses railing. Has following equipment at home: Single point cane, grab bars in the shower, and  raised commode over the toilet. Some uneven surfaces getting into home that she has to pay attention to decrease occurrence of falls.      Patient Goals: Patient would like to be able to run and exercise, move more, and be able to lift more than 40 lbs. Pt would ike to get back to dancing with a little more ease.    PRECAUTIONS: none    SUBJECTIVE:                                                                                                                                                                                       SUBJECTIVE STATEMENT: Pt reports feeling generally well at arrival. She did have a hard time getting to sleep last night - no disturbed sleep due to pain. Patient reports her R ankle is "not bad" this AM. She had some episodes of her R knee buckling yesterday.     PAIN:  Are you having pain? No   OBJECTIVE: (objective measures completed at initial evaluation unless otherwise dated)   Patient Surveys  FOTO: Pt reported 57 (FOTO risk adjusted 46),predicted improvement to 40 ABC: deferred     Cognition Patient is oriented to person, place, and time.  Recent memory is intact.  Remote memory is intact.  Attention span and concentration are intact.  Expressive speech is intact.  Patient's fund of knowledge is within normal limits for educational level.                            Gross Musculoskeletal Assessment Tremor: None Bulk: Normal Tone: Normal     GAIT: Distance walked: deferred Assistive device utilized: Single point cane used for longer distances Level of assistance: Complete Independence Comments: lateral trunk sway, decreased step length, decreased R knee flexion in swing phase.     Posture: No gross abnormalities noted in standing or seated posture     AROM       AROM (Normal range in degrees) AROM  03/15/2022         Hip Right Left  Flexion (125) 100 110  Extension (15)      Abduction (40)      Adduction       Internal Rotation (45)      External Rotation (45)             Knee      Flexion (135) 135* WNL  Extension (0) +3 +3         Ankle      Dorsiflexion (20)      Plantarflexion (50)      Inversion (  35)      Eversion (15      (* = pain; Blank rows = not tested)     PROM Hip flexion: R WNL, L WNL  Knee PROM WNL, pain with end-range R knee flexion      LE MMT:   MMT (out of 5) Right 03/15/2022 Left 03/15/2022  Right 04/26/22 Left 04/26/22  Hip flexion 4 4 4 4   Hip extension        Hip abduction 3 3 4- 4-  Hip adduction (seated) 5 5    Hip internal rotation 3 3 4 3   Hip external rotation 5 5   5 5   Knee flexion 5 5 5 5   Knee extension 3* 5 3* 5  Ankle dorsiflexion 5 5 5 5   Ankle plantarflexion        Ankle inversion 4 5 5 5   Ankle eversion        (* = pain; Blank rows = not tested)     Sensation Deferred     Reflexes Deferred       FUNCTIONAL OUTCOME MEASURES INITIAL EVAL   Results Comments  TUG 8.66 seconds    5TSTS Deferred due to deficit in sit to stand performance    (Blank rows = not tested)    TODAY'S TREATMENT     Therapeutic Exercise - improved strength as needed to improve performance of CKC activities/functional movements, for improved ability to perform stair negotiation and transferring  - NuStep Seat 10 handles 8 Resistance 5 for 6 minutes - for soft tissue warm-up to improve muscle performance and improve mobility   -subjective information gathered during this time intermittently, 2 minutes not billed   -High knee along blue agility ladder; 5x D/B   -Toe tap with mirror feedback to reduce compensatory lateral flexion; 2x10 alternating   - Banded lateral stepping with Blue Tband on agility ladder 1x5 D/B length of the agility ladder   -Total Gym single-limb minisquat; performed bilaterally for 3x8  - Sit-to-stand from chair; 2x10, with 4-lb Medball goblet hold    -Tandem stance on Airex; 2x30 sec with each LE posterior  -forward step up with contralateral LE tap on second step; 2x10    *next visit*  -Forward minilunge; 2x10, in // bars    *not today* -Tandem walk on blue line (on agility ladder); multiple attempts with difficulty maintaining balance -Long Airex Tandem walk; 2x D/B with multiple touches on armrest due to LOB -Unipedal stance, multiple attempts with 2 sets of 10-sec holds obtained on either LE - Step-ups on 6 inch step and Airex  pad; 2x10 bilateral  - used unilateral UE support and minimal cueing on holding single limb stance - single limb stance hold;  2x10 with 10 sec hold - Banded lateral stepping with Red Theraband on agility ladder 2 D/B length of the agility ladder  - limited by fatigue and ache in knees - Mini squat in pain free range; 1x10  - forward mini lunge, adjacent to treadmill armrest as needed for balance; 1x10  - 3-way hip exercise with 4lbs. 3x12 bilateral - used unilateral UE support with minimal cueing for slow and controlled movements - side-lying hip abduction; 2x10 bilateral - prone hip extension; 2x10 bilateral - reverse clamshells; 2x10 bilateral - Total gym double-limb squat; 2x10  - moderate cueing on keeping heels on the platform and widening stance - Tandem stance on Airex: 4x1 minute (RLE behind and then switched to LLE)    PATIENT EDUCATION:  Education details: Discussed current PT progress and role of PT moving forward Person educated: Patient Education method: Explanation Education comprehension: verbalized understanding     HOME EXERCISE PROGRAM: Access Code: [email or web address] URL: [email or web address] Date: 03/17/2022 Prepared by: Valentina Gu  Exercises - Supine Active Straight Leg Raise  - 2 x daily - 7 x weekly - 2 sets - 10 reps - Supine Bridge  - 2 x daily - 7 x weekly - 2 sets - 10 reps - Standing Anterior Toe Taps  - 2 x daily - 7 x weekly - 2 sets - 10 reps - Standing 3-Way Kick  - 2 x daily - 7 x weekly - 2 sets - 10 reps - Side Stepping with Counter Support  - 2 x daily - 7 x weekly - 2 sets - 10 reps - Mini Squat with Counter Support  - 2 x daily - 7 x weekly - 2 sets - 10 reps     ASSESSMENT:   CLINICAL IMPRESSION: Patient is improving in ability to perform reciprocal stair climbing pattern, though she is still demonstrating notable gait deficits that may also be partially attributed to comorbid back pain with episodic flare-ups (primarily  with compensated Trendelenburg/ipsilateral lateral flexion of trunk during stance phase). Pt is able to decrease this compensatory pattern with mirror feedback today. Pt is making good progress today, but she is still challenged with transferring and stair negotiation. Patient still has remaining deficits in LE strength, postural control on either LE, and variable thigh/knee pain with associated activity limitations in transferring, gait, and obstacle/stair/curb negotiation. Pt will continue to benefit from skilled PT services to address her noted deficits and improve function.     GOALS: Goals reviewed with patient? No   SHORT TERM GOALS: Target date: 04/15/22   Pt will be independent with HEP in order to improve strength and balance in order to decrease fall risk and improve function at home. Baseline: 03/15/22: pt HEP was reviewed for carryover effect.    04/26/22: pt has held off on doing HEP in the past couple of weeks with recently moving and latest episode of chronic low back pain.  Goal status: ON-GOING     LONG TERM GOALS: Target date: 05/13/22   Pt will increase FOTO to at least 61 to demonstrate significant improvement in function at home related to balance and pain. Baseline: 03/15/22: 60 (FOTO adjusted score of 46).   04/26/22: 55 Goal status: ON-GOING   2.  Pt will increase in R knee extension MMT strength for ability to tolerate standing/walking demands needed for negotiating community ambulation. Baseline: 03/15/22: 3*/5      04/26/22: 3*/5 Goal status: ON-GOING   3.  Pt will decrease time of performance of 5TSTS by 4 seconds to demonstrate improved ability to transfer to-and-from commode with decreased difficulty.  Baseline: 03/15/22: Unable to accomplish 5TSTS without arm rests    04/26/22: pt performed 5TSTS in 17.73 sec. Goal status: ON-GOING   4.  Pt will be able to independently perform climbing a flight of stairs with unilateral UE or less support and reciprocal pattern to  demonstrate increased ability to navigate home staircase.  Baseline: 03/15/22: heavy upper extremity reliance for stair negotiation and having to perform step-to with her home's stair height.   04/26/22: moderate unilateral UE support with step-to-step pattern when ascending and descending the stairs; pt has more UE reliance in descending staircase.  Goal status: ON-GOING     PLAN: PT FREQUENCY: 2x/week   PT DURATION:  4-6 weeks   PLANNED INTERVENTIONS: Therapeutic exercises, Therapeutic activity, Neuromuscular re-education, Balance training, Gait training, Patient/Family education, Joint manipulation, Joint mobilization, Canalith repositioning, Aquatic Therapy, Dry Needling, Cognitive remediation, Electrical stimulation, Spinal manipulation, Spinal mobilization, Cryotherapy, Moist heat, Traction, Ultrasound, Ionotophoresis 4mg /ml Dexamethasone, and Manual therapy   PLAN FOR NEXT SESSION: Abduction and quadriceps strengthening, sit-to-stand, internal rotation exercise, and update HEP for increase in difficulty appropriate to progress.     Valentina Gu, PT, DPT [phone] 05/24/2022  8:14 AM

## 2022-05-26 ENCOUNTER — Ambulatory Visit: Payer: Medicare HMO | Admitting: Physical Therapy

## 2022-05-31 ENCOUNTER — Encounter: Payer: Self-pay | Admitting: Physical Therapy

## 2022-05-31 ENCOUNTER — Ambulatory Visit: Payer: Medicare HMO | Attending: Student | Admitting: Physical Therapy

## 2022-05-31 DIAGNOSIS — M79605 Pain in left leg: Secondary | ICD-10-CM | POA: Diagnosis present

## 2022-05-31 DIAGNOSIS — R262 Difficulty in walking, not elsewhere classified: Secondary | ICD-10-CM | POA: Diagnosis present

## 2022-05-31 DIAGNOSIS — R2681 Unsteadiness on feet: Secondary | ICD-10-CM | POA: Diagnosis present

## 2022-05-31 DIAGNOSIS — M79604 Pain in right leg: Secondary | ICD-10-CM | POA: Insufficient documentation

## 2022-05-31 DIAGNOSIS — R2689 Other abnormalities of gait and mobility: Secondary | ICD-10-CM | POA: Insufficient documentation

## 2022-05-31 DIAGNOSIS — M79651 Pain in right thigh: Secondary | ICD-10-CM | POA: Insufficient documentation

## 2022-05-31 DIAGNOSIS — M6281 Muscle weakness (generalized): Secondary | ICD-10-CM | POA: Diagnosis present

## 2022-05-31 NOTE — Therapy (Unsigned)
OUTPATIENT PHYSICAL THERAPY TREATMENT AND PROGRESS NOTE   Dates of reporting period  ***   to   ***   Patient Name: Kristin Lopez MRN: [id number] DOB:Feb 11, 1966, 57 y.o., female Today's Date: 05/19/2022   END OF SESSION:        Past Medical History:  Diagnosis Date   Hyperlipidemia    Past Surgical History:  Procedure Laterality Date   ABDOMINAL HYSTERECTOMY  2021   FEMUR FRACTURE SURGERY     FEMUR IM NAIL Left 02/03/2021   Procedure: INTRAMEDULLARY (IM) RETROGRADE FEMORAL EXCHANGE NAILING;  Surgeon: Shona Needles, MD;  Location: [facility];  Service: Orthopedics;  Laterality: Left;   FEMUR IM NAIL Right 06/30/2021   Procedure: EXCHANGE NAILING FEMUR NONUNION;  Surgeon: Shona Needles, MD;  Location: [facility];  Service: Orthopedics;  Laterality: Right;   HARDWARE REMOVAL Left 02/03/2021   Procedure: HARDWARE REMOVAL;  Surgeon: Shona Needles, MD;  Location: [facility];  Service: Orthopedics;  Laterality: Left;   KNEE SURGERY     SUBMANDIBULAR GLAND EXCISION  1982   Patient Active Problem List   Diagnosis Date Noted   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  Rationale for Evaluation and Treatment Rehabilitation  PERTINENT HISTORY: Pt reports that she typically walks without use of single point cane and only uses it for long distances and shopping. Getting up from a chair and couch is hard without pushing  off from arm rest or legs. (Independent sit to stand goal). Can't sleep on right side, because hardware in distal femur is uncomfortable in this position. Some buckling but less recently. Still can't get on her knees for those type of chores. Staying in one position for long periods of time causes them pain and after a long day of being on their feet causes more pain and fatigue. R LE tends to always be numb and has some tingling but less than previous visit.      Pain: Yes  R/L thigh/knee: Now/Best 3/10, Worst 5/10    R Ankle: Now/Best 0/10, Worst 7/10 (tends to improve with repeated motion) Numbness/Tingling: Yes Recent changes in overall health/medication: No Prior history of physical therapy for present condition: Yes, previous episode of care s/p R femoral exchange nailing Falls: Has patient fallen in last 6 months? Yes, Number of falls: 2 Directional pattern for falls: No   Imaging: Yes ,   X-ray 06/30/21:  IMPRESSION: Internal fixation of incompletely healed mid femoral shaft fracture, which is in near anatomic alignment.     Prior level of function: Independent and Independent with household mobility without device Occupational demands: -    Precautions: None   Weight Bearing Restrictions: No   Living Environment Lives with: lives with their family Lives in: House/apartment ; Pt reports having13 steps and uses railing. Has following equipment at home: Single point cane, grab bars in the shower, and raised commode over the toilet. Some uneven  surfaces getting into home that she has to pay attention to decrease occurrence of falls.      Patient Goals: Patient would like to be able to run and exercise, move more, and be able to lift more than 40 lbs. Pt would ike to get back to dancing with a little more ease.    PRECAUTIONS: none    SUBJECTIVE:                                                                                                                                                                                       SUBJECTIVE STATEMENT: Pt reports 70% global rating of improvement. She reports intermittent L knee pain. Pt reports limited standing tolerance - up to 15 min. She reports difficulty with going up stairs more than going down stairs. Patient reports that she has made notable progress, but she still has some limitation with prolonged standing/walking.     PAIN:  Are you having pain? No   OBJECTIVE: (objective measures completed at initial evaluation unless otherwise dated)   Patient Surveys  FOTO: Pt reported 29 (FOTO risk adjusted 46),predicted improvement to 75 ABC: deferred     Cognition Patient is oriented to person, place, and time.  Recent memory is intact.  Remote memory is intact.  Attention span and concentration are intact.  Expressive speech is intact.  Patient's fund of knowledge is within normal limits for educational level.                            Gross Musculoskeletal Assessment Tremor: None Bulk: Normal Tone: Normal     GAIT: Distance walked: deferred Assistive device utilized: Single point cane used for longer distances Level of assistance: Complete Independence Comments: lateral trunk sway, decreased step length, decreased R knee flexion in swing phase.     Posture: No gross abnormalities noted in standing or seated posture     AROM       AROM (Normal range in degrees) AROM  03/15/2022         Hip Right Left  Flexion (125) 100 110  Extension (15)      Abduction (40)      Adduction       Internal Rotation (45)      External Rotation (45)             Knee      Flexion (135) 135* WNL  Extension (0) +3 +3         Ankle      Dorsiflexion (20)      Plantarflexion (50)      Inversion (  35)      Eversion (15      (* = pain; Blank rows = not tested)     PROM Hip flexion: R WNL, L WNL  Knee PROM WNL, pain with end-range R knee flexion      LE MMT:   MMT (out of 5) Right 03/15/2022  Left 03/15/2022 Right 04/26/22 Left 04/26/22 Right 05/31/22 Left 05/31/22  Hip flexion 4 4 4 4  4+ 5-  Hip extension          Hip abduction 3 3 4- 4- 4+ 4  Hip adduction (seated) 5 5      Hip internal rotation 3 3 4 3  3+ 5  Hip external rotation 5 5   5 5  4* 5-  Knee flexion 5 5 5 5 5 5   Knee extension 3* 5 3* 5 5- 4+  Ankle dorsiflexion 5 5 5 5 5 5   Ankle plantarflexion          Ankle inversion 4 5 5 5  4+ 4+  Ankle eversion          (* = pain; Blank rows = not tested)     Sensation Deferred     Reflexes Deferred       FUNCTIONAL OUTCOME MEASURES INITIAL EVAL   Results Comments  TUG 8.66 seconds    5TSTS Deferred due to deficit in sit to stand performance    (Blank rows = not tested)    TODAY'S TREATMENT     Therapeutic Exercise - improved strength as needed to improve performance of CKC activities/functional movements, for improved ability to perform stair negotiation and transferring  - NuStep Seat 10 handles 8 Resistance 5 for 6 minutes - for soft tissue warm-up to improve muscle performance and improve mobility   -subjective information gathered during this time intermittently, 2 minutes not billed   -High knee along blue agility ladder; 5x D/B   -Toe tap with mirror feedback to reduce compensatory lateral flexion; 2x10 alternating   - Banded lateral stepping with Blue Tband on agility ladder 1x5 D/B length of the agility ladder   -Total Gym single-limb minisquat; performed bilaterally for 3x8  - Sit-to-stand from chair; 2x10, with 4-lb Medball goblet hold    -Tandem stance on Airex; 2x30 sec with each LE posterior  -forward step up with contralateral LE tap on second step; 2x10    *next visit*  -Forward minilunge; 2x10, in // bars    *not today* -Tandem walk on blue line (on agility ladder); multiple attempts with difficulty maintaining balance -Long Airex Tandem walk; 2x D/B with multiple touches on armrest due to LOB -Unipedal stance, multiple  attempts with 2 sets of 10-sec holds obtained on either LE - Step-ups on 6 inch step and Airex pad; 2x10 bilateral  - used unilateral UE support and minimal cueing on holding single limb stance - single limb stance hold;  2x10 with 10 sec hold - Banded lateral stepping with Red Theraband on agility ladder 2 D/B length of the agility ladder  - limited by fatigue and ache in knees - Mini squat in pain free range; 1x10  - forward mini lunge, adjacent to treadmill armrest as needed for balance; 1x10  - 3-way hip exercise with 4lbs. 3x12 bilateral - used unilateral UE support with minimal cueing for slow and controlled movements - side-lying hip abduction; 2x10 bilateral - prone hip extension; 2x10 bilateral - reverse clamshells; 2x10 bilateral - Total gym double-limb squat; 2x10  - moderate cueing on  keeping heels on the platform and widening stance - Tandem stance on Airex: 4x1 minute (RLE behind and then switched to LLE)    PATIENT EDUCATION:  Education details: Discussed current PT progress and role of PT moving forward Person educated: Patient Education method: Explanation Education comprehension: verbalized understanding     HOME EXERCISE PROGRAM: Access Code: [email or web address] URL: [email or web address] Date: 03/17/2022 Prepared by: Valentina Gu  Exercises - Supine Active Straight Leg Raise  - 2 x daily - 7 x weekly - 2 sets - 10 reps - Supine Bridge  - 2 x daily - 7 x weekly - 2 sets - 10 reps - Standing Anterior Toe Taps  - 2 x daily - 7 x weekly - 2 sets - 10 reps - Standing 3-Way Kick  - 2 x daily - 7 x weekly - 2 sets - 10 reps - Side Stepping with Counter Support  - 2 x daily - 7 x weekly - 2 sets - 10 reps - Mini Squat with Counter Support  - 2 x daily - 7 x weekly - 2 sets - 10 reps     ASSESSMENT:   CLINICAL IMPRESSION: Patient is improving in ability to perform reciprocal stair climbing pattern, though she is still demonstrating notable gait deficits  that may also be partially attributed to comorbid back pain with episodic flare-ups (primarily with compensated Trendelenburg/ipsilateral lateral flexion of trunk during stance phase). Pt is able to decrease this compensatory pattern with mirror feedback today. Pt is making good progress today, but she is still challenged with transferring and stair negotiation. Patient still has remaining deficits in LE strength, postural control on either LE, and variable thigh/knee pain with associated activity limitations in transferring, gait, and obstacle/stair/curb negotiation. Pt will continue to benefit from skilled PT services to address her noted deficits and improve function.     GOALS: Goals reviewed with patient? No   SHORT TERM GOALS: Target date: 04/15/22   Pt will be independent with HEP in order to improve strength and balance in order to decrease fall risk and improve function at home. Baseline: 03/15/22: pt HEP was reviewed for carryover effect.    04/26/22: pt has held off on doing HEP in the past couple of weeks with recently moving and latest episode of chronic low back pain.  05/31/22: Pt is compliant with HEP Goal status: ACHIEVED      LONG TERM GOALS: Target date: 05/13/22   Pt will increase FOTO to at least 61 to demonstrate significant improvement in function at home related to balance and pain. Baseline: 03/15/22: 60 (FOTO adjusted score of 46).   04/26/22: 55.   05/31/22: 57/61  Goal status: ON-GOING   2.  Pt will increase in R knee extension MMT strength for ability to tolerate standing/walking demands needed for negotiating community ambulation. Baseline: 03/15/22: 3*/5      04/26/22: 3*/5   05/31/22: 5-/5  Goal status: ACHIEVED    3.  Pt will decrease time of performance of 5TSTS by 4 seconds to demonstrate improved ability to transfer to-and-from commode with decreased difficulty.  Baseline: 03/15/22: Unable to accomplish 5TSTS without arm rests    04/26/22: pt performed 5TSTS in 17.73 sec.      05/31/22: 15 sec  Goal status: IN PROGRESS    4.  Pt will be able to independently perform climbing a flight of stairs with unilateral UE or less support and reciprocal pattern to demonstrate increased ability to navigate home staircase.  Baseline: 03/15/22: heavy  upper extremity reliance for stair negotiation and having to perform step-to with her home's stair height.   04/26/22: moderate unilateral UE support with step-to-step pattern when ascending and descending the stairs; pt has more UE reliance in descending staircase.  05/31/22:  Goal status: ON-GOING     PLAN: PT FREQUENCY: 2x/week   PT DURATION: 4-6 weeks   PLANNED INTERVENTIONS: Therapeutic exercises, Therapeutic activity, Neuromuscular re-education, Balance training, Gait training, Patient/Family education, Joint manipulation, Joint mobilization, Canalith repositioning, Aquatic Therapy, Dry Needling, Cognitive remediation, Electrical stimulation, Spinal manipulation, Spinal mobilization, Cryotherapy, Moist heat, Traction, Ultrasound, Ionotophoresis 4mg /ml Dexamethasone, and Manual therapy   PLAN FOR NEXT SESSION: Abduction and quadriceps strengthening, sit-to-stand, internal rotation exercise, and update HEP for increase in difficulty appropriate to progress.     Valentina Gu, PT, DPT [phone] 05/31/2022  7:29 AM

## 2022-06-02 ENCOUNTER — Ambulatory Visit: Payer: Medicare HMO | Admitting: Physical Therapy

## 2022-06-02 DIAGNOSIS — R262 Difficulty in walking, not elsewhere classified: Secondary | ICD-10-CM

## 2022-06-02 DIAGNOSIS — M6281 Muscle weakness (generalized): Secondary | ICD-10-CM

## 2022-06-02 NOTE — Therapy (Signed)
OUTPATIENT PHYSICAL THERAPY TREATMENT    Patient Name: Kristin Lopez MRN: [id number] DOB:1965/05/15, 57 y.o., female Today's Date: 06/02/2022   END OF SESSION:   PT End of Session - 06/02/22 0832     Visit Number 16    Number of Visits 23    Date for PT Re-Evaluation 06/22/22    PT Start Time 0830    PT Stop Time 0901    PT Time Calculation (min) 31 min    Activity Tolerance Patient tolerated treatment well    Behavior During Therapy [facility] for tasks assessed/performed              Past Medical History:  Diagnosis Date   Hyperlipidemia    Past Surgical History:  Procedure Laterality Date   ABDOMINAL HYSTERECTOMY  2021   FEMUR FRACTURE SURGERY     FEMUR IM NAIL Left 02/03/2021   Procedure: INTRAMEDULLARY (IM) RETROGRADE FEMORAL EXCHANGE NAILING;  Surgeon: Shona Needles, MD;  Location: [place];  Service: Orthopedics;  Laterality: Left;   FEMUR IM NAIL Right 06/30/2021   Procedure: EXCHANGE NAILING FEMUR NONUNION;  Surgeon: Shona Needles, MD;  Location: [place];  Service: Orthopedics;  Laterality: Right;   HARDWARE REMOVAL Left 02/03/2021   Procedure: HARDWARE REMOVAL;  Surgeon: Shona Needles, MD;  Location: [place];  Service: Orthopedics;  Laterality: Left;   KNEE SURGERY     SUBMANDIBULAR GLAND EXCISION  1982   Patient Active Problem List   Diagnosis Date Noted   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 06/30/2021   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 02/03/2021   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 01/14/2021     PCP: none on file  REFERRING PROVIDER: Haddix, Thomasene Lot, MD   REFERRING DIAGNOSIS: S72.302K (ICD-10-CM) - Unspecified fracture of shaft of left femur, subsequent encounter for closed fracture with nonunion    REFERRING DIAG: S72.302K (ICD-10-CM) - Unspecified fracture of shaft of left femur, subsequent encounter for closed fracture with nonunion    THERAPY DIAG:  Difficulty  in walking, not elsewhere classified  Muscle weakness (generalized)  Rationale for Evaluation and Treatment Rehabilitation  PERTINENT HISTORY: Pt reports that she typically walks without use of single point cane and only uses it for long distances and shopping. Getting up from a chair and couch is hard without pushing off from arm rest or legs. (Independent sit to stand goal). Can't sleep on right side, because hardware in distal femur is uncomfortable in this position. Some buckling but less recently. Still can't get on her knees for those type of chores. Staying in one position for long periods of time causes them pain and after a long day of being on their feet causes more pain and fatigue. R LE tends to always be numb and has some tingling but less than previous visit.      Pain: Yes  R/L thigh/knee: Now/Best 3/10, Worst 5/10    R Ankle: Now/Best 0/10, Worst 7/10 (tends to improve with repeated motion) Numbness/Tingling: Yes Recent changes in overall health/medication: No Prior history of physical therapy for present condition: Yes, previous episode of care s/p R femoral exchange nailing Falls: Has patient fallen in last 6 months? Yes, Number of falls: 2 Directional pattern for falls: No   Imaging: Yes ,   X-ray 06/30/21:  IMPRESSION: Internal fixation of incompletely healed mid femoral shaft fracture, which is in near anatomic alignment.  Prior level of function: Independent and Independent with household mobility without device Occupational demands: -    Precautions: None   Weight Bearing Restrictions: No   Living Environment Lives with: lives with their family Lives in: House/apartment ; Pt reports having13 steps and uses railing. Has following equipment at home: Single point cane, grab bars in the shower, and raised commode over the toilet. Some uneven surfaces getting into home that she has to pay attention to decrease occurrence of falls.      Patient Goals: Patient  would like to be able to run and exercise, move more, and be able to lift more than 40 lbs. Pt would ike to get back to dancing with a little more ease.    PRECAUTIONS: none    SUBJECTIVE:                                                                                                                                                                                      SUBJECTIVE STATEMENT: Pt arrives 14 min following appointment time due to her medical transportation. Patient reports her lower extremities feel generally well. She reports some back pain the previous day that has since improved with use of Meloxicam and Tylenol.     PAIN:  Are you having pain? No   OBJECTIVE: (objective measures completed at initial evaluation unless otherwise dated)   Patient Surveys  FOTO: Pt reported 68 (FOTO risk adjusted 46),predicted improvement to 72 ABC: deferred     Cognition Patient is oriented to person, place, and time.  Recent memory is intact.  Remote memory is intact.  Attention span and concentration are intact.  Expressive speech is intact.  Patient's fund of knowledge is within normal limits for educational level.                            Gross Musculoskeletal Assessment Tremor: None Bulk: Normal Tone: Normal     GAIT: Distance walked: deferred Assistive device utilized: Single point cane used for longer distances Level of assistance: Complete Independence Comments: lateral trunk sway, decreased step length, decreased R knee flexion in swing phase.     Posture: No gross abnormalities noted in standing or seated posture     AROM       AROM (Normal range in degrees) AROM  03/15/2022         Hip Right Left  Flexion (125) 100 110  Extension (15)      Abduction (40)      Adduction       Internal Rotation (45)      External Rotation (45)  Knee      Flexion (135) 135* WNL  Extension (0) +3 +3         Ankle      Dorsiflexion (20)       Plantarflexion (50)      Inversion (35)      Eversion (15      (* = pain; Blank rows = not tested)     PROM Hip flexion: R WNL, L WNL  Knee PROM WNL, pain with end-range R knee flexion      LE MMT:   MMT (out of 5) Right 03/15/2022 Left 03/15/2022 Right 04/26/22 Left 04/26/22 Right 05/31/22 Left 05/31/22  Hip flexion 4 4 4 4  4+ 5-  Hip extension          Hip abduction 3 3 4- 4- 4+ 4  Hip adduction (seated) 5 5      Hip internal rotation 3 3 4 3  3+ 5  Hip external rotation 5 5   5 5  4* 5-  Knee flexion 5 5 5 5 5 5   Knee extension 3* 5 3* 5 5- 4+  Ankle dorsiflexion 5 5 5 5 5 5   Ankle plantarflexion          Ankle inversion 4 5 5 5  4+ 4+  Ankle eversion          (* = pain; Blank rows = not tested)     Sensation Deferred     Reflexes Deferred       FUNCTIONAL OUTCOME MEASURES INITIAL EVAL/BASELINE   Results Comments  TUG 8.66 seconds    5TSTS Deferred due to deficit in sit to stand performance    (Blank rows = not tested)    TODAY'S TREATMENT     Therapeutic Exercise - improved strength as needed to improve performance of CKC activities/functional movements, for improved ability to perform stair negotiation and transferring  - NuStep Seat 10 handles 8 Resistance 6 for 4 minutes - for soft tissue warm-up to improve muscle performance and improve mobility   -subjective information gathered during this time intermittently, 2 minutes not billed    - Banded lateral stepping with Black Tband on agility ladder 1x5 D/B length of the agility ladder    -Tandem walk on blue line in parallel bars(on agility ladder); multiple attempts with difficulty maintaining balance   -Forward walking minilunge; 6x D/B, in // bars  -Toe tap with mirror feedback to reduce compensatory lateral flexion; 2x10 alternating   - Sit-to-stand from chair; 1x10 without weight; 1x10 with 4-lb Medball goblet hold    -forward step up with contralateral LE tap on second step; 2x10  -Total Gym  single-limb minisquat; performed bilaterally for 2x10     *not today* -Tandem stance on Airex; 2x30 sec with each LE posterior -High knee along blue agility ladder; 5x D/B  -Long Airex Tandem walk; 2x D/B with multiple touches on armrest due to LOB -Unipedal stance, multiple attempts with 2 sets of 10-sec holds obtained on either LE - Step-ups on 6 inch step and Airex pad; 2x10 bilateral  - used unilateral UE support and minimal cueing on holding single limb stance - single limb stance hold;  2x10 with 10 sec hold - Banded lateral stepping with Red Theraband on agility ladder 2 D/B length of the agility ladder  - limited by fatigue and ache in knees - Mini squat in pain free range; 1x10  - forward mini lunge, adjacent to treadmill armrest as needed for balance; 1x10  -  3-way hip exercise with 4lbs. 3x12 bilateral - used unilateral UE support with minimal cueing for slow and controlled movements - side-lying hip abduction; 2x10 bilateral - prone hip extension; 2x10 bilateral - reverse clamshells; 2x10 bilateral - Total gym double-limb squat; 2x10  - moderate cueing on keeping heels on the platform and widening stance - Tandem stance on Airex: 4x1 minute (RLE behind and then switched to LLE)    PATIENT EDUCATION:  Education details: see above for patient education details Person educated: Patient Education method: Explanation Education comprehension: verbalized understanding     HOME EXERCISE PROGRAM: Access Code: [email or web address] URL: [email or web address] Date: 03/17/2022 Prepared by: Consuela Mimes  Exercises - Supine Active Straight Leg Raise  - 2 x daily - 7 x weekly - 2 sets - 10 reps - Supine Bridge  - 2 x daily - 7 x weekly - 2 sets - 10 reps - Standing Anterior Toe Taps  - 2 x daily - 7 x weekly - 2 sets - 10 reps - Standing 3-Way Kick  - 2 x daily - 7 x weekly - 2 sets - 10 reps - Side Stepping with Counter Support  - 2 x daily - 7 x weekly - 2 sets - 10  reps - Mini Squat with Counter Support  - 2 x daily - 7 x weekly - 2 sets - 10 reps     ASSESSMENT:   CLINICAL IMPRESSION: Our treatment time is shorter today due to medical transport arriving 14 minutes after appointment time. Pt is motivated and participates well with expedited session focused on closed-chain strengthening and functional movements. She demonstrates improving compensated Trendelenburg and improving single-limb strength as noted by forward step-up performance and Total Gym single-limb squat performance. Patient has remaining deficits in LE strength, postural control on either LE, and variable thigh/knee pain with associated activity limitations in transferring, gait, and obstacle/stair/curb negotiation. Pt will continue to benefit from skilled PT services to address her noted deficits and improve function.     GOALS:  SHORT TERM GOALS: Target date: 04/15/22   Pt will be independent with HEP in order to improve strength and balance in order to decrease fall risk and improve function at home. Baseline: 03/15/22: pt HEP was reviewed for carryover effect.    04/26/22: pt has held off on doing HEP in the past couple of weeks with recently moving and latest episode of chronic low back pain.  05/31/22: Pt is compliant with HEP Goal status: ACHIEVED      LONG TERM GOALS: Target date: 06/30/22   Pt will increase FOTO to at least 61 to demonstrate significant improvement in function at home related to balance and pain. Baseline: 03/15/22: 60 (FOTO adjusted score of 46).   04/26/22: 55.   05/31/22: 57/61  Goal status: ON-GOING   2.  Pt will increase in R knee extension MMT strength for ability to tolerate standing/walking demands needed for negotiating community ambulation. Baseline: 03/15/22: 3*/5      04/26/22: 3*/5   05/31/22: 5-/5  Goal status: ACHIEVED    3.  Pt will decrease time of performance of 5TSTS by 4 seconds to demonstrate improved ability to transfer to-and-from commode with  decreased difficulty.  Baseline: 03/15/22: Unable to accomplish 5TSTS without arm rests    04/26/22: pt performed 5TSTS in 17.73 sec.     05/31/22: 15 sec  Goal status: IN PROGRESS    4.  Pt will be able to independently perform climbing a flight of stairs with unilateral  UE or less support and reciprocal pattern to demonstrate increased ability to navigate home staircase.  Baseline: 03/15/22: heavy upper extremity reliance for stair negotiation and having to perform step-to with her home's stair height.   04/26/22: moderate unilateral UE support with step-to-step pattern when ascending and descending the stairs; pt has more UE reliance in descending staircase.  05/31/22: Pt completed stair ascent and descent with intermittent bilat UE support with mild instability with reciprocal pattern and abrupt weight shift to contralateral LE; pt is safer with step-to pattern at this time.   Goal status: ON-GOING     PLAN: PT FREQUENCY: 2x/week   PT DURATION: 4 weeks   PLANNED INTERVENTIONS: Therapeutic exercises, Therapeutic activity, Neuromuscular re-education, Balance training, Gait training, Patient/Family education, Joint manipulation, Joint mobilization, Dry Needling, Electrical stimulation, Cryotherapy, Moist heat, and Manual therapy   PLAN FOR NEXT SESSION: Continue with closed-chain strengthening, strategies to improve weight shift without compensated Trendelenburg, postural control work and stabilization work with uneven/compliant surfaces; progress advanced-phase strengthening as tolerated    Consuela Mimes, PT, DPT [phone] 06/02/2022  12:49 PM

## 2022-06-07 ENCOUNTER — Ambulatory Visit: Payer: Medicare HMO | Admitting: Physical Therapy

## 2022-06-07 ENCOUNTER — Encounter: Payer: Self-pay | Admitting: Physical Therapy

## 2022-06-07 DIAGNOSIS — R262 Difficulty in walking, not elsewhere classified: Secondary | ICD-10-CM | POA: Diagnosis not present

## 2022-06-07 DIAGNOSIS — M6281 Muscle weakness (generalized): Secondary | ICD-10-CM

## 2022-06-07 NOTE — Therapy (Signed)
OUTPATIENT PHYSICAL THERAPY TREATMENT    Patient Name: Kristin Lopez MRN: [id number] DOB:[birth year], 57 y.o., female Today's Date: 06/07/2022   END OF SESSION:   PT End of Session - 06/07/22 0803     Visit Number 17    Number of Visits 23    Date for PT Re-Evaluation 06/22/22    PT Start Time 0804    PT Stop Time 0852    PT Time Calculation (min) 48 min    Activity Tolerance Patient tolerated treatment well    Behavior During Therapy [facility] for tasks assessed/performed               Past Medical History:  Diagnosis Date   Hyperlipidemia    Past Surgical History:  Procedure Laterality Date   ABDOMINAL HYSTERECTOMY  2021   FEMUR FRACTURE SURGERY     FEMUR IM NAIL Left 02/03/2021   Procedure: INTRAMEDULLARY (IM) RETROGRADE FEMORAL EXCHANGE NAILING;  Surgeon: Roby Lofts, MD;  Location: [facility] OR;  Service: Orthopedics;  Laterality: Left;   FEMUR IM NAIL Right 06/30/2021   Procedure: EXCHANGE NAILING FEMUR NONUNION;  Surgeon: Roby Lofts, MD;  Location: [facility] OR;  Service: Orthopedics;  Laterality: Right;   HARDWARE REMOVAL Left 02/03/2021   Procedure: HARDWARE REMOVAL;  Surgeon: Roby Lofts, MD;  Location: [facility] OR;  Service: Orthopedics;  Laterality: Left;   KNEE SURGERY     SUBMANDIBULAR GLAND EXCISION  1982   Patient Active Problem List   Diagnosis Date Noted   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 06/30/2021   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 02/03/2021   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 01/14/2021     PCP: none on file  REFERRING PROVIDER: Haddix, Gillie Manners, MD   REFERRING DIAGNOSIS: S72.302K (ICD-10-CM) - Unspecified fracture of shaft of left femur, subsequent encounter for closed fracture with nonunion    REFERRING DIAG: S72.302K (ICD-10-CM) - Unspecified fracture of shaft of left femur, subsequent encounter for closed fracture with nonunion    THERAPY DIAG:  Difficulty  in walking, not elsewhere classified  Muscle weakness (generalized)  Rationale for Evaluation and Treatment Rehabilitation  PERTINENT HISTORY: Pt reports that she typically walks without use of single point cane and only uses it for long distances and shopping. Getting up from a chair and couch is hard without pushing off from arm rest or legs. (Independent sit to stand goal). Can't sleep on right side, because hardware in distal femur is uncomfortable in this position. Some buckling but less recently. Still can't get on her knees for those type of chores. Staying in one position for long periods of time causes them pain and after a long day of being on their feet causes more pain and fatigue. R LE tends to always be numb and has some tingling but less than previous visit.      Pain: Yes  R/L thigh/knee: Now/Best 3/10, Worst 5/10    R Ankle: Now/Best 0/10, Worst 7/10 (tends to improve with repeated motion) Numbness/Tingling: Yes Recent changes in overall health/medication: No Prior history of physical therapy for present condition: Yes, previous episode of care s/p R femoral exchange nailing Falls: Has patient fallen in last 6 months? Yes, Number of falls: 2 Directional pattern for falls: No   Imaging: Yes ,   X-ray 06/30/21:  IMPRESSION: Internal fixation of incompletely healed mid femoral shaft fracture, which is in near anatomic alignment.  Prior level of function: Independent and Independent with household mobility without device Occupational demands: -    Precautions: None   Weight Bearing Restrictions: No   Living Environment Lives with: lives with their family Lives in: House/apartment ; Pt reports having13 steps and uses railing. Has following equipment at home: Single point cane, grab bars in the shower, and raised commode over the toilet. Some uneven surfaces getting into home that she has to pay attention to decrease occurrence of falls.      Patient Goals: Patient  would like to be able to run and exercise, move more, and be able to lift more than 40 lbs. Pt would ike to get back to dancing with a little more ease.    PRECAUTIONS: none    SUBJECTIVE:                                                                                                                                                                                      SUBJECTIVE STATEMENT: Pt reports some L knee pain first thing in AM when getting up. She is compliant with HEP. She reports minimal pain at this time, though she did have notable leg pain during the weekend. She reports compliance with HEP.     PAIN:  Are you having pain? No   OBJECTIVE: (objective measures completed at initial evaluation unless otherwise dated)   Patient Surveys  FOTO: Pt reported 66 (FOTO risk adjusted 46),predicted improvement to 29 ABC: deferred     Cognition Patient is oriented to person, place, and time.  Recent memory is intact.  Remote memory is intact.  Attention span and concentration are intact.  Expressive speech is intact.  Patient's fund of knowledge is within normal limits for educational level.                            Gross Musculoskeletal Assessment Tremor: None Bulk: Normal Tone: Normal     GAIT: Distance walked: deferred Assistive device utilized: Single point cane used for longer distances Level of assistance: Complete Independence Comments: lateral trunk sway, decreased step length, decreased R knee flexion in swing phase.     Posture: No gross abnormalities noted in standing or seated posture     AROM       AROM (Normal range in degrees) AROM  03/15/2022         Hip Right Left  Flexion (125) 100 110  Extension (15)      Abduction (40)      Adduction       Internal Rotation (45)      External Rotation (45)  Knee      Flexion (135) 135* WNL  Extension (0) +3 +3         Ankle      Dorsiflexion (20)      Plantarflexion (50)       Inversion (35)      Eversion (15      (* = pain; Blank rows = not tested)     PROM Hip flexion: R WNL, L WNL  Knee PROM WNL, pain with end-range R knee flexion      LE MMT:   MMT (out of 5) Right 03/15/2022 Left 03/15/2022 Right 04/26/22 Left 04/26/22 Right 05/31/22 Left 05/31/22  Hip flexion 4 4 4 4  4+ 5-  Hip extension          Hip abduction 3 3 4- 4- 4+ 4  Hip adduction (seated) 5 5      Hip internal rotation 3 3 4 3  3+ 5  Hip external rotation 5 5   5 5  4* 5-  Knee flexion 5 5 5 5 5 5   Knee extension 3* 5 3* 5 5- 4+  Ankle dorsiflexion 5 5 5 5 5 5   Ankle plantarflexion          Ankle inversion 4 5 5 5  4+ 4+  Ankle eversion          (* = pain; Blank rows = not tested)     Sensation Deferred     Reflexes Deferred       FUNCTIONAL OUTCOME MEASURES INITIAL EVAL/BASELINE   Results Comments  TUG 8.66 seconds    5TSTS Deferred due to deficit in sit to stand performance    (Blank rows = not tested)    TODAY'S TREATMENT     Therapeutic Exercise - improved strength as needed to improve performance of CKC activities/functional movements, for improved ability to perform stair negotiation and transferring  - NuStep Seat 10 handles 8 Resistance 6 for 4 minutes - for soft tissue warm-up to improve muscle performance and improve mobility   -subjective information gathered during this time intermittently, 2 minutes not billed    - Banded lateral stepping with Black Tband on agility ladder 1x5 D/B length of the agility ladder    -Tandem walk on blue line (in parallel bars); 4x D/B   -Forward walking minilunge; 6x D/B (in parallel bars)  -Toe tap with mirror feedback to reduce compensatory lateral flexion; 2x10 alternating   -Single hurdle step (one step over 6-inch hurdle and then return to standing with hurdle in front); 2x10 with ea LE with mirror feedback to reduce compensatory sidebend/compensated Trendelenburg  - Sit-to-stand from chair; 2x12 with 6-lb Medball  goblet hold    -forward step up with contralateral LE tap on second step; 2x10, staircase in center of gym   *next visit* -Total Gym single-limb minisquat; performed bilaterally for 2x10     *not today* -Tandem stance on Airex; 2x30 sec with each LE posterior -High knee along blue agility ladder; 5x D/B  -Long Airex Tandem walk; 2x D/B with multiple touches on armrest due to LOB -Unipedal stance, multiple attempts with 2 sets of 10-sec holds obtained on either LE - Step-ups on 6 inch step and Airex pad; 2x10 bilateral  - used unilateral UE support and minimal cueing on holding single limb stance - single limb stance hold;  2x10 with 10 sec hold - Banded lateral stepping with Red Theraband on agility ladder 2 D/B length of the agility ladder  - limited  by fatigue and ache in knees - Mini squat in pain free range; 1x10  - forward mini lunge, adjacent to treadmill armrest as needed for balance; 1x10  - 3-way hip exercise with 4lbs. 3x12 bilateral - used unilateral UE support with minimal cueing for slow and controlled movements - side-lying hip abduction; 2x10 bilateral - prone hip extension; 2x10 bilateral - reverse clamshells; 2x10 bilateral - Total gym double-limb squat; 2x10  - moderate cueing on keeping heels on the platform and widening stance - Tandem stance on Airex: 4x1 minute (RLE behind and then switched to LLE)    PATIENT EDUCATION:  Education details: see above for patient education details Person educated: Patient Education method: Explanation Education comprehension: verbalized understanding     HOME EXERCISE PROGRAM: Access Code: [email or web address] URL: [email or web address] Date: 03/17/2022 Prepared by: Consuela Mimes  Exercises - Supine Active Straight Leg Raise  - 2 x daily - 7 x weekly - 2 sets - 10 reps - Supine Bridge  - 2 x daily - 7 x weekly - 2 sets - 10 reps - Standing Anterior Toe Taps  - 2 x daily - 7 x weekly - 2 sets - 10 reps -  Standing 3-Way Kick  - 2 x daily - 7 x weekly - 2 sets - 10 reps - Side Stepping with Counter Support  - 2 x daily - 7 x weekly - 2 sets - 10 reps - Mini Squat with Counter Support  - 2 x daily - 7 x weekly - 2 sets - 10 reps     ASSESSMENT:   CLINICAL IMPRESSION: Patient has minimal pain this AM, though she has experienced intermittent L infrapatellar pain with first getting up in AM. She has had to use her SPC more during episodes of L knee pain, though she generally does not use her cane for most home mobility otherwise. We continued with focus on functional movements, closed-chain strengthening, and motor control re-training to reduce compensatory pattern (ipsilateral trunk sidebend with stance on each LE). Patient has remaining deficits in LE strength, postural control on either LE, and variable thigh/knee pain with associated activity limitations in transferring, gait, and obstacle/stair/curb negotiation. Pt will continue to benefit from skilled PT services to address her noted deficits and improve function.     GOALS:  SHORT TERM GOALS: Target date: 04/15/22   Pt will be independent with HEP in order to improve strength and balance in order to decrease fall risk and improve function at home. Baseline: 03/15/22: pt HEP was reviewed for carryover effect.    04/26/22: pt has held off on doing HEP in the past couple of weeks with recently moving and latest episode of chronic low back pain.  05/31/22: Pt is compliant with HEP Goal status: ACHIEVED      LONG TERM GOALS: Target date: 06/30/22   Pt will increase FOTO to at least 61 to demonstrate significant improvement in function at home related to balance and pain. Baseline: 03/15/22: 60 (FOTO adjusted score of 46).   04/26/22: 55.   05/31/22: 57/61  Goal status: ON-GOING   2.  Pt will increase in R knee extension MMT strength for ability to tolerate standing/walking demands needed for negotiating community ambulation. Baseline: 03/15/22: 3*/5       04/26/22: 3*/5   05/31/22: 5-/5  Goal status: ACHIEVED    3.  Pt will decrease time of performance of 5TSTS by 4 seconds to demonstrate improved ability to transfer to-and-from commode with decreased difficulty.  Baseline:  03/15/22: Unable to accomplish 5TSTS without arm rests    04/26/22: pt performed 5TSTS in 17.73 sec.     05/31/22: 15 sec  Goal status: IN PROGRESS    4.  Pt will be able to independently perform climbing a flight of stairs with unilateral UE or less support and reciprocal pattern to demonstrate increased ability to navigate home staircase.  Baseline: 03/15/22: heavy upper extremity reliance for stair negotiation and having to perform step-to with her home's stair height.   04/26/22: moderate unilateral UE support with step-to-step pattern when ascending and descending the stairs; pt has more UE reliance in descending staircase.  05/31/22: Pt completed stair ascent and descent with intermittent bilat UE support with mild instability with reciprocal pattern and abrupt weight shift to contralateral LE; pt is safer with step-to pattern at this time.   Goal status: ON-GOING     PLAN: PT FREQUENCY: 2x/week   PT DURATION: 4 weeks   PLANNED INTERVENTIONS: Therapeutic exercises, Therapeutic activity, Neuromuscular re-education, Balance training, Gait training, Patient/Family education, Joint manipulation, Joint mobilization, Dry Needling, Electrical stimulation, Cryotherapy, Moist heat, and Manual therapy   PLAN FOR NEXT SESSION: Continue with closed-chain strengthening, strategies to improve weight shift without compensated Trendelenburg, postural control work and stabilization work with uneven/compliant surfaces; progress advanced-phase strengthening as tolerated    Consuela Mimes, PT, DPT [phone] 06/07/2022  9:06 AM

## 2022-06-09 ENCOUNTER — Ambulatory Visit: Payer: Medicare HMO | Admitting: Physical Therapy

## 2022-06-09 ENCOUNTER — Encounter: Payer: Self-pay | Admitting: Physical Therapy

## 2022-06-09 DIAGNOSIS — R262 Difficulty in walking, not elsewhere classified: Secondary | ICD-10-CM

## 2022-06-09 DIAGNOSIS — M6281 Muscle weakness (generalized): Secondary | ICD-10-CM

## 2022-06-09 NOTE — Therapy (Signed)
OUTPATIENT PHYSICAL THERAPY TREATMENT    Patient Name: Kristin Lopez MRN: [id number] DOB:September 22, 1965, 57 y.o., female Today's Date: 06/09/2022   END OF SESSION:   PT End of Session - 06/09/22 0821     Visit Number 18    Number of Visits 23    Date for PT Re-Evaluation 06/22/22    PT Start Time 0806    PT Stop Time 0854    PT Time Calculation (min) 48 min    Activity Tolerance Patient tolerated treatment well    Behavior During Therapy [facility] for tasks assessed/performed                Past Medical History:  Diagnosis Date   Hyperlipidemia    Past Surgical History:  Procedure Laterality Date   ABDOMINAL HYSTERECTOMY  2021   FEMUR FRACTURE SURGERY     FEMUR IM NAIL Left 02/03/2021   Procedure: INTRAMEDULLARY (IM) RETROGRADE FEMORAL EXCHANGE NAILING;  Surgeon: Roby Lofts, MD;  Location: [facility] OR;  Service: Orthopedics;  Laterality: Left;   FEMUR IM NAIL Right 06/30/2021   Procedure: EXCHANGE NAILING FEMUR NONUNION;  Surgeon: Roby Lofts, MD;  Location: [facility] OR;  Service: Orthopedics;  Laterality: Right;   HARDWARE REMOVAL Left 02/03/2021   Procedure: HARDWARE REMOVAL;  Surgeon: Roby Lofts, MD;  Location: [facility] OR;  Service: Orthopedics;  Laterality: Left;   KNEE SURGERY     SUBMANDIBULAR GLAND EXCISION  1982   Patient Active Problem List   Diagnosis Date Noted   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 06/30/2021   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 02/03/2021   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 01/14/2021     PCP: none on file  REFERRING PROVIDER: Haddix, Gillie Manners, MD   REFERRING DIAGNOSIS: S72.302K (ICD-10-CM) - Unspecified fracture of shaft of left femur, subsequent encounter for closed fracture with nonunion    REFERRING DIAG: S72.302K (ICD-10-CM) - Unspecified fracture of shaft of left femur, subsequent encounter for closed fracture with nonunion    THERAPY DIAG:   Difficulty in walking, not elsewhere classified  Muscle weakness (generalized)  Rationale for Evaluation and Treatment Rehabilitation  PERTINENT HISTORY: Pt reports that she typically walks without use of single point cane and only uses it for long distances and shopping. Getting up from a chair and couch is hard without pushing off from arm rest or legs. (Independent sit to stand goal). Can't sleep on right side, because hardware in distal femur is uncomfortable in this position. Some buckling but less recently. Still can't get on her knees for those type of chores. Staying in one position for long periods of time causes them pain and after a long day of being on their feet causes more pain and fatigue. R LE tends to always be numb and has some tingling but less than previous visit.      Pain: Yes  R/L thigh/knee: Now/Best 3/10, Worst 5/10    R Ankle: Now/Best 0/10, Worst 7/10 (tends to improve with repeated motion) Numbness/Tingling: Yes Recent changes in overall health/medication: No Prior history of physical therapy for present condition: Yes, previous episode of care s/p R femoral exchange nailing Falls: Has patient fallen in last 6 months? Yes, Number of falls: 2 Directional pattern for falls: No   Imaging: Yes ,   X-ray 06/30/21:  IMPRESSION: Internal fixation of incompletely healed mid femoral shaft fracture, which is in near anatomic alignment.  Prior level of function: Independent and Independent with household mobility without device Occupational demands: -    Precautions: None   Weight Bearing Restrictions: No   Living Environment Lives with: lives with their family Lives in: House/apartment ; Pt reports having13 steps and uses railing. Has following equipment at home: Single point cane, grab bars in the shower, and raised commode over the toilet. Some uneven surfaces getting into home that she has to pay attention to decrease occurrence of falls.      Patient  Goals: Patient would like to be able to run and exercise, move more, and be able to lift more than 40 lbs. Pt would ike to get back to dancing with a little more ease.    PRECAUTIONS: none    SUBJECTIVE:                                                                                                                                                                                      SUBJECTIVE STATEMENT: Pt reports some L knee pain first thing in AM when getting up. She is compliant with HEP. She reports minimal pain at this time, though she did have notable leg pain during the weekend. She reports compliance with HEP.     PAIN:  Are you having pain? No   OBJECTIVE: (objective measures completed at initial evaluation unless otherwise dated)   Patient Surveys  FOTO: Pt reported 58 (FOTO risk adjusted 46),predicted improvement to 73 ABC: deferred     Cognition Patient is oriented to person, place, and time.  Recent memory is intact.  Remote memory is intact.  Attention span and concentration are intact.  Expressive speech is intact.  Patient's fund of knowledge is within normal limits for educational level.                            Gross Musculoskeletal Assessment Tremor: None Bulk: Normal Tone: Normal     GAIT: Distance walked: deferred Assistive device utilized: Single point cane used for longer distances Level of assistance: Complete Independence Comments: lateral trunk sway, decreased step length, decreased R knee flexion in swing phase.     Posture: No gross abnormalities noted in standing or seated posture     AROM       AROM (Normal range in degrees) AROM  03/15/2022         Hip Right Left  Flexion (125) 100 110  Extension (15)      Abduction (40)      Adduction       Internal Rotation (45)      External Rotation (45)  Knee      Flexion (135) 135* WNL  Extension (0) +3 +3         Ankle      Dorsiflexion (20)      Plantarflexion  (50)      Inversion (35)      Eversion (15      (* = pain; Blank rows = not tested)     PROM Hip flexion: R WNL, L WNL  Knee PROM WNL, pain with end-range R knee flexion      LE MMT:   MMT (out of 5) Right 03/15/2022 Left 03/15/2022 Right 04/26/22 Left 04/26/22 Right 05/31/22 Left 05/31/22  Hip flexion 4 4 4 4  4+ 5-  Hip extension          Hip abduction 3 3 4- 4- 4+ 4  Hip adduction (seated) 5 5      Hip internal rotation 3 3 4 3  3+ 5  Hip external rotation 5 5   5 5  4* 5-  Knee flexion 5 5 5 5 5 5   Knee extension 3* 5 3* 5 5- 4+  Ankle dorsiflexion 5 5 5 5 5 5   Ankle plantarflexion          Ankle inversion 4 5 5 5  4+ 4+  Ankle eversion          (* = pain; Blank rows = not tested)     Sensation Deferred     Reflexes Deferred       FUNCTIONAL OUTCOME MEASURES INITIAL EVAL/BASELINE   Results Comments  TUG 8.66 seconds    5TSTS Deferred due to deficit in sit to stand performance    (Blank rows = not tested)    TODAY'S TREATMENT     Therapeutic Exercise - improved strength as needed to improve performance of CKC activities/functional movements, for improved ability to perform stair negotiation and transferring, exercise to improve LE kinetic chain stability   - NuStep Seat 10 handles 8 Resistance 6 for 4 minutes - for soft tissue warm-up to improve muscle performance and improve mobility   -subjective information gathered during this time intermittently, 2 minutes not billed    -High knee along blue agility ladder, with 4-lb ankle weights; 4x D/B    - Banded lateral stepping with Black Tband on agility ladder 1x5 D/B length of the agility ladder    -Tandem walk on blue line on agility ladder; 4x D/B   -therapist SBA with patient intermittently using therapist's hand for stability as needed   -Forward walking minilunge; 6x D/B (in parallel bars)  -Single hurdle step (one step over 6-inch hurdle and then return to standing with hurdle in front); 2x10 with ea  LE with mirror feedback to reduce compensatory sidebend/compensated Trendelenburg  -Total Gym single-limb minisquat, Level 22; performed bilaterally for 2x10  -forward step up with contralateral LE tap on second step; 2x10, staircase in center of gym    PATIENT EDU: Discussed current progress and likely transition to HEP over the following month pending sufficient improvement in stair negotiation and community-level ambulation    *not today* -Toe tap with mirror feedback to reduce compensatory lateral flexion; 2x10 alternating  - Sit-to-stand from chair; 2x12 with 6-lb Medball goblet hold   -Tandem stance on Airex; 2x30 sec with each LE posterior -Long Airex Tandem walk; 2x D/B with multiple touches on armrest due to LOB -Unipedal stance, multiple attempts with 2 sets of 10-sec holds obtained on either LE - Step-ups on 6 inch step  and Airex pad; 2x10 bilateral  - used unilateral UE support and minimal cueing on holding single limb stance - single limb stance hold;  2x10 with 10 sec hold - Banded lateral stepping with Red Theraband on agility ladder 2 D/B length of the agility ladder  - limited by fatigue and ache in knees - Mini squat in pain free range; 1x10  - forward mini lunge, adjacent to treadmill armrest as needed for balance; 1x10  - 3-way hip exercise with 4lbs. 3x12 bilateral - used unilateral UE support with minimal cueing for slow and controlled movements - side-lying hip abduction; 2x10 bilateral - prone hip extension; 2x10 bilateral - reverse clamshells; 2x10 bilateral - Total gym double-limb squat; 2x10  - moderate cueing on keeping heels on the platform and widening stance - Tandem stance on Airex: 4x1 minute (RLE behind and then switched to LLE)    PATIENT EDUCATION:  Education details: see above for patient education details Person educated: Patient Education method: Explanation Education comprehension: verbalized understanding     HOME EXERCISE  PROGRAM: Access Code: [email or web address] URL: [email or web address] Date: 03/17/2022 Prepared by: Consuela MimesJeremy Argelio Granier  Exercises - Supine Active Straight Leg Raise  - 2 x daily - 7 x weekly - 2 sets - 10 reps - Supine Bridge  - 2 x daily - 7 x weekly - 2 sets - 10 reps - Standing Anterior Toe Taps  - 2 x daily - 7 x weekly - 2 sets - 10 reps - Standing 3-Way Kick  - 2 x daily - 7 x weekly - 2 sets - 10 reps - Side Stepping with Counter Support  - 2 x daily - 7 x weekly - 2 sets - 10 reps - Mini Squat with Counter Support  - 2 x daily - 7 x weekly - 2 sets - 10 reps     ASSESSMENT:   CLINICAL IMPRESSION: Patient demonstrates improved control over compensated Trendelenburg and improved tolerance of single-limb support time during gait. She is able to further progress gluteus medius and closed-chain LE strengthening with higher volume of closed-chain work performed with increasing demands on muscle performance. Pt has made good progress to date; she is mainly concerned with difficulty climbing stairs/stepping up. Patient has remaining deficits in LE strength, postural control on either LE, and variable thigh/knee pain with associated activity limitations in transferring, gait, and obstacle/stair/curb negotiation. Pt will continue to benefit from skilled PT services to address her noted deficits and improve function.     GOALS:  SHORT TERM GOALS: Target date: 04/15/22   Pt will be independent with HEP in order to improve strength and balance in order to decrease fall risk and improve function at home. Baseline: 03/15/22: pt HEP was reviewed for carryover effect.    04/26/22: pt has held off on doing HEP in the past couple of weeks with recently moving and latest episode of chronic low back pain.  05/31/22: Pt is compliant with HEP Goal status: ACHIEVED      LONG TERM GOALS: Target date: 06/30/22   Pt will increase FOTO to at least 61 to demonstrate significant improvement in function at home  related to balance and pain. Baseline: 03/15/22: 60 (FOTO adjusted score of 46).   04/26/22: 55.   05/31/22: 57/61  Goal status: ON-GOING   2.  Pt will increase in R knee extension MMT strength for ability to tolerate standing/walking demands needed for negotiating community ambulation. Baseline: 03/15/22: 3*/5      04/26/22: 3*/5  05/31/22: 5-/5  Goal status: ACHIEVED    3.  Pt will decrease time of performance of 5TSTS by 4 seconds to demonstrate improved ability to transfer to-and-from commode with decreased difficulty.  Baseline: 03/15/22: Unable to accomplish 5TSTS without arm rests    04/26/22: pt performed 5TSTS in 17.73 sec.     05/31/22: 15 sec  Goal status: IN PROGRESS    4.  Pt will be able to independently perform climbing a flight of stairs with unilateral UE or less support and reciprocal pattern to demonstrate increased ability to navigate home staircase.  Baseline: 03/15/22: heavy upper extremity reliance for stair negotiation and having to perform step-to with her home's stair height.   04/26/22: moderate unilateral UE support with step-to-step pattern when ascending and descending the stairs; pt has more UE reliance in descending staircase.  05/31/22: Pt completed stair ascent and descent with intermittent bilat UE support with mild instability with reciprocal pattern and abrupt weight shift to contralateral LE; pt is safer with step-to pattern at this time.   Goal status: ON-GOING     PLAN: PT FREQUENCY: 2x/week   PT DURATION: 4 weeks   PLANNED INTERVENTIONS: Therapeutic exercises, Therapeutic activity, Neuromuscular re-education, Balance training, Gait training, Patient/Family education, Joint manipulation, Joint mobilization, Dry Needling, Electrical stimulation, Cryotherapy, Moist heat, and Manual therapy   PLAN FOR NEXT SESSION: Continue with closed-chain strengthening, strategies to improve weight shift without compensated Trendelenburg, postural control work and stabilization work  with uneven/compliant surfaces; progress advanced-phase strengthening as tolerated    Consuela Mimes, PT, DPT [phone] 06/09/2022  8:55 AM

## 2022-06-14 ENCOUNTER — Ambulatory Visit: Payer: Medicare HMO

## 2022-06-14 DIAGNOSIS — R262 Difficulty in walking, not elsewhere classified: Secondary | ICD-10-CM

## 2022-06-14 DIAGNOSIS — R2689 Other abnormalities of gait and mobility: Secondary | ICD-10-CM

## 2022-06-14 DIAGNOSIS — M79605 Pain in left leg: Secondary | ICD-10-CM

## 2022-06-14 DIAGNOSIS — M6281 Muscle weakness (generalized): Secondary | ICD-10-CM

## 2022-06-14 DIAGNOSIS — M79651 Pain in right thigh: Secondary | ICD-10-CM

## 2022-06-14 DIAGNOSIS — R2681 Unsteadiness on feet: Secondary | ICD-10-CM

## 2022-06-14 DIAGNOSIS — M79604 Pain in right leg: Secondary | ICD-10-CM

## 2022-06-14 NOTE — Therapy (Signed)
OUTPATIENT PHYSICAL THERAPY TREATMENT    Patient Name: Kristin Lopez MRN: [id number] DOB:15-Feb-1966, 57 y.o., female Today's Date: 06/14/2022   END OF SESSION:   PT End of Session - 06/14/22 1004     Visit Number 19    Number of Visits 23    Date for PT Re-Evaluation 06/22/22    Authorization Type initial eval 03/15/22    Progress Note Due on Visit 10    PT Start Time 0817    PT Stop Time 0904    PT Time Calculation (min) 47 min    Equipment Utilized During Treatment Gait belt    Activity Tolerance Patient tolerated treatment well    Behavior During Therapy WFL for tasks assessed/performed                Past Medical History:  Diagnosis Date   Hyperlipidemia    Past Surgical History:  Procedure Laterality Date   ABDOMINAL HYSTERECTOMY  2021   FEMUR FRACTURE SURGERY     FEMUR IM NAIL Left 02/03/2021   Procedure: INTRAMEDULLARY (IM) RETROGRADE FEMORAL EXCHANGE NAILING;  Surgeon: Roby Lofts, MD;  Location: [facility] OR;  Service: Orthopedics;  Laterality: Left;   FEMUR IM NAIL Right 06/30/2021   Procedure: EXCHANGE NAILING FEMUR NONUNION;  Surgeon: Roby Lofts, MD;  Location: [facility] OR;  Service: Orthopedics;  Laterality: Right;   HARDWARE REMOVAL Left 02/03/2021   Procedure: HARDWARE REMOVAL;  Surgeon: Roby Lofts, MD;  Location: [facility] OR;  Service: Orthopedics;  Laterality: Left;   KNEE SURGERY     SUBMANDIBULAR GLAND EXCISION  1982   Patient Active Problem List   Diagnosis Date Noted   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 06/30/2021   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 02/03/2021   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 01/14/2021     PCP: none on file  REFERRING PROVIDER: Haddix, Gillie Manners, MD   REFERRING DIAGNOSIS: S72.302K (ICD-10-CM) - Unspecified fracture of shaft of left femur, subsequent encounter for closed fracture with nonunion    REFERRING DIAG: S72.302K (ICD-10-CM) -  Unspecified fracture of shaft of left femur, subsequent encounter for closed fracture with nonunion    THERAPY DIAG:  Difficulty in walking, not elsewhere classified  Muscle weakness (generalized)  Pain in right leg  Pain in right thigh  Other abnormalities of gait and mobility  Unsteadiness on feet  Pain in left leg  Rationale for Evaluation and Treatment Rehabilitation  PERTINENT HISTORY: Pt reports that she typically walks without use of single point cane and only uses it for long distances and shopping. Getting up from a chair and couch is hard without pushing off from arm rest or legs. (Independent sit to stand goal). Can't sleep on right side, because hardware in distal femur is uncomfortable in this position. Some buckling but less recently. Still can't get on her knees for those type of chores. Staying in one position for long periods of time causes them pain and after a long day of being on their feet causes more pain and fatigue. R LE tends to always be numb and has some tingling but less than previous visit.      Pain: Yes  R/L thigh/knee: Now/Best 3/10, Worst 5/10    R Ankle: Now/Best 0/10, Worst 7/10 (tends to improve with repeated motion) Numbness/Tingling: Yes Recent changes in overall health/medication: No Prior history of physical therapy for present condition: Yes, previous  episode of care s/p R femoral exchange nailing Falls: Has patient fallen in last 6 months? Yes, Number of falls: 2 Directional pattern for falls: No   Imaging: Yes ,   X-ray 06/30/21:  IMPRESSION: Internal fixation of incompletely healed mid femoral shaft fracture, which is in near anatomic alignment.     Prior level of function: Independent and Independent with household mobility without device Occupational demands: -    Precautions: None   Weight Bearing Restrictions: No   Living Environment Lives with: lives with their family Lives in: House/apartment ; Pt reports having13 steps  and uses railing. Has following equipment at home: Single point cane, grab bars in the shower, and raised commode over the toilet. Some uneven surfaces getting into home that she has to pay attention to decrease occurrence of falls.      Patient Goals: Patient would like to be able to run and exercise, move more, and be able to lift more than 40 lbs. Pt would ike to get back to dancing with a little more ease.    PRECAUTIONS: none    SUBJECTIVE:                                                                                                                                                                                      SUBJECTIVE STATEMENT: No new C/O. Pt reports some L knee pain first thing in AM when getting up. She is compliant with HEP. She reports minimal pain at this time, though she did have notable leg pain during the weekend. She reports compliance with HEP.     PAIN:  Are you having pain? No   OBJECTIVE: (objective measures completed at initial evaluation unless otherwise dated)   Patient Surveys  FOTO: Pt reported 43 (FOTO risk adjusted 46),predicted improvement to 21 ABC: deferred     Cognition Patient is oriented to person, place, and time.  Recent memory is intact.  Remote memory is intact.  Attention span and concentration are intact.  Expressive speech is intact.  Patient's fund of knowledge is within normal limits for educational level.                            Gross Musculoskeletal Assessment Tremor: None Bulk: Normal Tone: Normal     GAIT: Distance walked: deferred Assistive device utilized: Single point cane used for longer distances Level of assistance: Complete Independence Comments: lateral trunk sway, decreased step length, decreased R knee flexion in swing phase.     Posture: No gross abnormalities noted in standing or seated posture     AROM  AROM (Normal range in degrees) AROM  03/15/2022         Hip Right Left   Flexion (125) 100 110  Extension (15)      Abduction (40)      Adduction       Internal Rotation (45)      External Rotation (45)             Knee      Flexion (135) 135* WNL  Extension (0) +3 +3         Ankle      Dorsiflexion (20)      Plantarflexion (50)      Inversion (35)      Eversion (15      (* = pain; Blank rows = not tested)     PROM Hip flexion: R WNL, L WNL  Knee PROM WNL, pain with end-range R knee flexion      LE MMT:   MMT (out of 5) Right 03/15/2022 Left 03/15/2022 Right 04/26/22 Left 04/26/22 Right 05/31/22 Left 05/31/22  Hip flexion 4 4 4 4  4+ 5-  Hip extension          Hip abduction 3 3 4- 4- 4+ 4  Hip adduction (seated) 5 5      Hip internal rotation 3 3 4 3  3+ 5  Hip external rotation 5 5   5 5  4* 5-  Knee flexion 5 5 5 5 5 5   Knee extension 3* 5 3* 5 5- 4+  Ankle dorsiflexion 5 5 5 5 5 5   Ankle plantarflexion          Ankle inversion 4 5 5 5  4+ 4+  Ankle eversion          (* = pain; Blank rows = not tested)     Sensation Deferred     Reflexes Deferred       FUNCTIONAL OUTCOME MEASURES INITIAL EVAL/BASELINE   Results Comments  TUG 8.66 seconds    5TSTS Deferred due to deficit in sit to stand performance    (Blank rows = not tested)    TODAY'S TREATMENT :    Therapeutic Exercise 47 mins  - improved strength as needed to improve performance of CKC activities/functional movements, for improved ability to perform stair negotiation and transferring, exercise to improve LE kinetic chain stability   -Hamstring stretches 2 x 60 secs  -Calf stretch 2 x 30 secs - NuStep Seat 8 handles 8  Resistance 1- 5 for warm-up to for 45 secs to  improve muscle performance and improve mobility and 5 mins for  the rest of the time.   -High knee along blue agility ladder with GTB 60 secs   - Banded lateral stepping with Black Tband on agility ladder 1 x 60 secs. length of the agility ladder    -Tandem walk on Blue line on agility ladder; 1 x 60  secs with Postural control.    -therapist SBA with patient intermittently using therapist's hand for stability as needed   -Forward walking with minin squats with GTB 1 x 60 secs -Single hurdle step (one step over 6-inch hurdle and then return to standing with hurdle in front); 2x10 with ea LE with mirror feedback to reduce compensatory sidebend/compensated Trendelenburg -Total Gym single-limb minisquat, Level 22; performed bilaterally for 2x10 -forward step up with contralateral LE tap on second step; 2x10, staircase in center of gym - Bosu ball balancing 1 x 60 secs   PATIENT EDU: Discussed  current progress and likely transition to HEP over the following month pending sufficient improvement in stair negotiation and community-level ambulation    *not today*  -Toe tap with mirror feedback to reduce compensatory lateral flexion; 2x10 alternating  - Sit-to-stand from chair; 2x12 with 6-lb Medball goblet hold   -Tandem stance on Airex; 2x30 sec with each LE posterior -Long Airex Tandem walk; 2x D/B with multiple touches on armrest due to LOB -Unipedal stance, multiple attempts with 2 sets of 10-sec holds obtained on either LE - Step-ups on 6 inch step and Airex pad; 2x10 bilateral  - used unilateral UE support and minimal cueing on holding single limb stance - single limb stance hold;  2x10 with 10 sec hold - Banded lateral stepping with Red Theraband on agility ladder 2 D/B length of the agility ladder  - limited by fatigue and ache in knees - Mini squat in pain free range; 1x10  - forward mini lunge, adjacent to treadmill armrest as needed for balance; 1x10  - 3-way hip exercise with 4lbs. 3x12 bilateral - used unilateral UE support with minimal cueing for slow and controlled movements - side-lying hip abduction; 2x10 bilateral - prone hip extension; 2x10 bilateral - reverse clamshells; 2x10 bilateral - Total gym double-limb squat; 2x10  - moderate cueing on keeping heels on the  platform and widening stance - Tandem stance on Airex: 4x1 minute (RLE behind and then switched to LLE)    PATIENT EDUCATION:  Education details: see above for patient education details Person educated: Patient Education method: Explanation Education comprehension: verbalized understanding     HOME EXERCISE PROGRAM: Access Code: [email or web address] URL: [email or web address] Date: 03/17/2022 Prepared by: Consuela Mimes  Exercises - Supine Active Straight Leg Raise  - 2 x daily - 7 x weekly - 2 sets - 10 reps - Supine Bridge  - 2 x daily - 7 x weekly - 2 sets - 10 reps - Standing Anterior Toe Taps  - 2 x daily - 7 x weekly - 2 sets - 10 reps - Standing 3-Way Kick  - 2 x daily - 7 x weekly - 2 sets - 10 reps - Side Stepping with Counter Support  - 2 x daily - 7 x weekly - 2 sets - 10 reps - Mini Squat with Counter Support  - 2 x daily - 7 x weekly - 2 sets - 10 reps     ASSESSMENT:   CLINICAL IMPRESSION: Patient demonstrates improved control over compensated Trendelenburg and improved tolerance of single-limb support time during gait. She is able to further progress gluteus medius and closed-chain LE strengthening with higher volume of closed-chain work performed with increasing demands on muscle performance. Pt has made good progress to date; she is mainly concerned with difficulty climbing stairs/stepping up. Patient has remaining deficits in LE strength, postural control on either LE, and variable thigh/knee pain with associated activity limitations in transferring, gait, and obstacle/stair/curb negotiation. Pt will continue to benefit from skilled PT services to address her noted deficits and improve function.     GOALS:  SHORT TERM GOALS: Target date: 04/15/22   Pt will be independent with HEP in order to improve strength and balance in order to decrease fall risk and improve function at home. Baseline: 03/15/22: pt HEP was reviewed for carryover effect.    04/26/22: pt has  held off on doing HEP in the past couple of weeks with recently moving and latest episode of chronic low back pain.  05/31/22: Pt is compliant  with HEP Goal status: ACHIEVED      LONG TERM GOALS: Target date: 06/30/22   Pt will increase FOTO to at least 61 to demonstrate significant improvement in function at home related to balance and pain. Baseline: 03/15/22: 60 (FOTO adjusted score of 46).   04/26/22: 55.   05/31/22: 57/61  Goal status: ON-GOING   2.  Pt will increase in R knee extension MMT strength for ability to tolerate standing/walking demands needed for negotiating community ambulation. Baseline: 03/15/22: 3*/5      04/26/22: 3*/5   05/31/22: 5-/5  Goal status: ACHIEVED    3.  Pt will decrease time of performance of 5TSTS by 4 seconds to demonstrate improved ability to transfer to-and-from commode with decreased difficulty.  Baseline: 03/15/22: Unable to accomplish 5TSTS without arm rests    04/26/22: pt performed 5TSTS in 17.73 sec.     05/31/22: 15 sec  Goal status: IN PROGRESS    4.  Pt will be able to independently perform climbing a flight of stairs with unilateral UE or less support and reciprocal pattern to demonstrate increased ability to navigate home staircase.  Baseline: 03/15/22: heavy upper extremity reliance for stair negotiation and having to perform step-to with her home's stair height.   04/26/22: moderate unilateral UE support with step-to-step pattern when ascending and descending the stairs; pt has more UE reliance in descending staircase.  05/31/22: Pt completed stair ascent and descent with intermittent bilat UE support with mild instability with reciprocal pattern and abrupt weight shift to contralateral LE; pt is safer with step-to pattern at this time.   Goal status: ON-GOING     PLAN: PT FREQUENCY: 2x/week   PT DURATION: 4 weeks   PLANNED INTERVENTIONS: Therapeutic exercises, Therapeutic activity, Neuromuscular re-education, Balance training, Gait training, Patient/Family  education, Joint manipulation, Joint mobilization, Dry Needling, Electrical stimulation, Cryotherapy, Moist heat, and Manual therapy   PLAN FOR NEXT SESSION: Continue with closed-chain strengthening, strategies to improve weight shift without compensated Trendelenburg, postural control work and stabilization work with uneven/compliant surfaces; progress advanced-phase strengthening as tolerated    Consuela Mimes, PT, DPT [phone] 06/14/2022  10:11 AM      Encounter Date: 06/14/2022   Collie Siad, PT 06/14/2022, 10:11 AM  [facility] [address] [place], [place], [address] Phone: [phone]   Fax:  [phone]

## 2022-06-16 ENCOUNTER — Ambulatory Visit: Payer: Medicare HMO | Admitting: Physical Therapy

## 2022-06-23 ENCOUNTER — Ambulatory Visit: Payer: Medicare HMO | Admitting: Physical Therapy

## 2022-06-23 DIAGNOSIS — M6281 Muscle weakness (generalized): Secondary | ICD-10-CM

## 2022-06-23 DIAGNOSIS — R262 Difficulty in walking, not elsewhere classified: Secondary | ICD-10-CM

## 2022-06-23 NOTE — Therapy (Signed)
OUTPATIENT PHYSICAL THERAPY TREATMENT AND PROGRESS NOTE   Dates of reporting period  05/31/22   to   06/23/22    Patient Name: Kristin Lopez MRN: [id number] DOB:1965/09/16, 57 y.o., female Today's Date: 06/23/2022   END OF SESSION:   PT End of Session - 06/23/22 0928     Visit Number 20    Number of Visits 23    Date for PT Re-Evaluation 06/22/22    Authorization Type initial eval 03/15/22    Progress Note Due on Visit 10    PT Start Time 0940    PT Stop Time 1023    PT Time Calculation (min) 43 min    Equipment Utilized During Treatment Gait belt    Activity Tolerance Patient tolerated treatment well    Behavior During Therapy WFL for tasks assessed/performed                Past Medical History:  Diagnosis Date   Hyperlipidemia    Past Surgical History:  Procedure Laterality Date   ABDOMINAL HYSTERECTOMY  2021   FEMUR FRACTURE SURGERY     FEMUR IM NAIL Left 02/03/2021   Procedure: INTRAMEDULLARY (IM) RETROGRADE FEMORAL EXCHANGE NAILING;  Surgeon: Roby Lofts, MD;  Location: [facility] OR;  Service: Orthopedics;  Laterality: Left;   FEMUR IM NAIL Right 06/30/2021   Procedure: EXCHANGE NAILING FEMUR NONUNION;  Surgeon: Roby Lofts, MD;  Location: [facility] OR;  Service: Orthopedics;  Laterality: Right;   HARDWARE REMOVAL Left 02/03/2021   Procedure: HARDWARE REMOVAL;  Surgeon: Roby Lofts, MD;  Location: [facility] OR;  Service: Orthopedics;  Laterality: Left;   KNEE SURGERY     SUBMANDIBULAR GLAND EXCISION  1982   Patient Active Problem List   Diagnosis Date Noted   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 06/30/2021   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 02/03/2021   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 01/14/2021     PCP: none on file  REFERRING PROVIDER: Haddix, Gillie Manners, MD   REFERRING DIAGNOSIS: S72.302K (ICD-10-CM) - Unspecified fracture of shaft of left femur, subsequent encounter for  closed fracture with nonunion    REFERRING DIAG: S72.302K (ICD-10-CM) - Unspecified fracture of shaft of left femur, subsequent encounter for closed fracture with nonunion    THERAPY DIAG:  Difficulty in walking, not elsewhere classified  Muscle weakness (generalized)  Rationale for Evaluation and Treatment Rehabilitation  PERTINENT HISTORY: Pt reports that she typically walks without use of single point cane and only uses it for long distances and shopping. Getting up from a chair and couch is hard without pushing off from arm rest or legs. (Independent sit to stand goal). Can't sleep on right side, because hardware in distal femur is uncomfortable in this position. Some buckling but less recently. Still can't get on her knees for those type of chores. Staying in one position for long periods of time causes them pain and after a long day of being on their feet causes more pain and fatigue. R LE tends to always be numb and has some tingling but less than previous visit.      Pain: Yes  R/L thigh/knee: Now/Best 3/10, Worst 5/10    R Ankle: Now/Best 0/10, Worst 7/10 (tends to improve with repeated motion) Numbness/Tingling: Yes Recent changes in overall health/medication: No Prior history of physical therapy for present condition: Yes, previous episode of care s/p R femoral exchange nailing Falls:  Has patient fallen in last 6 months? Yes, Number of falls: 2 Directional pattern for falls: No   Imaging: Yes ,   X-ray 06/30/21:  IMPRESSION: Internal fixation of incompletely healed mid femoral shaft fracture, which is in near anatomic alignment.     Prior level of function: Independent and Independent with household mobility without device Occupational demands: -    Precautions: None   Weight Bearing Restrictions: No   Living Environment Lives with: lives with their family Lives in: House/apartment ; Pt reports having13 steps and uses railing. Has following equipment at home: Single  point cane, grab bars in the shower, and raised commode over the toilet. Some uneven surfaces getting into home that she has to pay attention to decrease occurrence of falls.      Patient Goals: Patient would like to be able to run and exercise, move more, and be able to lift more than 40 lbs. Pt would ike to get back to dancing with a little more ease.    PRECAUTIONS: none    SUBJECTIVE:                                                                                                                                                                                      SUBJECTIVE STATEMENT: Pt reports some recent R ankle pain along medial ankle/posterior tibial region. She reports ongoing sensitivity affecting R lateral knee and peroneal region below knee. She feels that climbing stairs is still challenging. She reports pain with lying onto her R knee. Patient reports 60% global rating of improvement. Pt tolerate standing approximately 15-20 minutes before having to spend time sitting. Patient reports that it takes her more time to climb stairs, but she is able to do it with UE support.     PAIN:  Are you having pain? No   OBJECTIVE: (objective measures completed at initial evaluation unless otherwise dated)   Patient Surveys  FOTO: Pt reported 49 (FOTO risk adjusted 46),predicted improvement to 54 ABC: deferred     Cognition Patient is oriented to person, place, and time.  Recent memory is intact.  Remote memory is intact.  Attention span and concentration are intact.  Expressive speech is intact.  Patient's fund of knowledge is within normal limits for educational level.                            Gross Musculoskeletal Assessment Tremor: None Bulk: Normal Tone: Normal     GAIT: Distance walked: deferred Assistive device utilized: Single point cane used for longer distances Level of assistance: Complete Independence Comments: lateral trunk sway, decreased step length,  decreased R knee flexion in swing phase.     Posture: No gross abnormalities noted in standing or seated posture     AROM       AROM (Normal range in degrees) AROM  03/15/2022         Hip Right Left  Flexion (125) 100 110  Extension (15)      Abduction (40)      Adduction       Internal Rotation (45)      External Rotation (45)             Knee      Flexion (135) 135* WNL  Extension (0) +3 +3         Ankle      Dorsiflexion (20)      Plantarflexion (50)      Inversion (35)      Eversion (15      (* = pain; Blank rows = not tested)     PROM Hip flexion: R WNL, L WNL  Knee PROM WNL, pain with end-range R knee flexion      LE MMT:   MMT (out of 5) Right 03/15/2022 Left 03/15/2022 Right 04/26/22 Left 04/26/22 Right 05/31/22 Left 05/31/22  Hip flexion 4 4 4 4  4+ 5-  Hip extension          Hip abduction 3 3 4- 4- 4+ 4  Hip adduction (seated) 5 5      Hip internal rotation 3 3 4 3  3+ 5  Hip external rotation 5 5   5 5  4* 5-  Knee flexion 5 5 5 5 5 5   Knee extension 3* 5 3* 5 5- 4+  Ankle dorsiflexion 5 5 5 5 5 5   Ankle plantarflexion          Ankle inversion 4 5 5 5  4+ 4+  Ankle eversion          (* = pain; Blank rows = not tested)     Sensation Deferred     Reflexes Deferred       FUNCTIONAL OUTCOME MEASURES INITIAL EVAL/BASELINE   Results Comments  TUG 8.66 seconds    5TSTS Deferred due to deficit in sit to stand performance    (Blank rows = not tested)    TODAY'S TREATMENT :    Therapeutic Exercise  - improved strength as needed to improve performance of CKC activities/functional movements, for improved ability to perform stair negotiation and transferring, exercise to improve LE kinetic chain stability   Ambulate laps in gym x 4   *GOAL UPDATE PERFORMED  -Toe tapping; 12-inch step; 2x10, alternating  -High knee along blue agility ladder; x5 D/B with 4-lb ankle weights    -Negotiation of flight of stairs outside in parking lot; up/down x  2   *pt attempted reciprocal pattern on ascent and descent, MinA required for eccentric control with descent; no LOB with ascent, but pt does need at least unilateral handrail support for reciprocal ascent    PATIENT EDU: Discussed current progress and likely transition to HEP over the following month pending sufficient improvement in stair negotiation and community-level ambulation   *next visit* - Banded lateral stepping with Black Tband on agility ladder 1 x 60 secs. length of the agility ladder   -Tandem walk on Blue line on agility ladder; 1 x 60 secs with Postural control.    -therapist SBA with patient intermittently using therapist's hand for stability as needed -Total Gym single-limb minisquat, Level  22; performed bilaterally for 2x10  -forward step up with contralateral LE tap on second step; 2x10, staircase in center of gym - Bosu ball balancing 1 x 60 secs    *not today*  -Single hurdle step (one step over 6-inch hurdle and then return to standing with hurdle in front); 2x10 with ea LE with mirror feedback to reduce compensatory sidebend/compensated Trendelenburg  -Hamstring stretches 2 x 60 secs  -Calf stretch 2 x 30 secs - NuStep Seat 8 handles 8  Resistance 1- 5 for warm-up to for 45 secs to  improve muscle performance and improve mobility and 5 mins for  the rest of the time.  -Toe tap with mirror feedback to reduce compensatory lateral flexion; 2x10 alternating  - Sit-to-stand from chair; 2x12 with 6-lb Medball goblet hold   -Tandem stance on Airex; 2x30 sec with each LE posterior -Long Airex Tandem walk; 2x D/B with multiple touches on armrest due to LOB -Unipedal stance, multiple attempts with 2 sets of 10-sec holds obtained on either LE - Step-ups on 6 inch step and Airex pad; 2x10 bilateral  - used unilateral UE support and minimal cueing on holding single limb stance - single limb stance hold;  2x10 with 10 sec hold - Banded lateral stepping with Red Theraband on  agility ladder 2 D/B length of the agility ladder  - limited by fatigue and ache in knees - Mini squat in pain free range; 1x10  - forward mini lunge, adjacent to treadmill armrest as needed for balance; 1x10  - 3-way hip exercise with 4lbs. 3x12 bilateral - used unilateral UE support with minimal cueing for slow and controlled movements - side-lying hip abduction; 2x10 bilateral - prone hip extension; 2x10 bilateral - reverse clamshells; 2x10 bilateral - Total gym double-limb squat; 2x10  - moderate cueing on keeping heels on the platform and widening stance - Tandem stance on Airex: 4x1 minute (RLE behind and then switched to LLE)    PATIENT EDUCATION:  Education details: see above for patient education details Person educated: Patient Education method: Explanation Education comprehension: verbalized understanding     HOME EXERCISE PROGRAM: Access Code: [email or web address] URL: [email or web address] Date: 03/17/2022 Prepared by: Consuela Mimes  Exercises - Supine Active Straight Leg Raise  - 2 x daily - 7 x weekly - 2 sets - 10 reps - Supine Bridge  - 2 x daily - 7 x weekly - 2 sets - 10 reps - Standing Anterior Toe Taps  - 2 x daily - 7 x weekly - 2 sets - 10 reps - Standing 3-Way Kick  - 2 x daily - 7 x weekly - 2 sets - 10 reps - Side Stepping with Counter Support  - 2 x daily - 7 x weekly - 2 sets - 10 reps - Mini Squat with Counter Support  - 2 x daily - 7 x weekly - 2 sets - 10 reps     ASSESSMENT:   CLINICAL IMPRESSION: Patient has met 5TSTS goal. She is still notably challenged with stair negotiation and has difficulty with single-limb eccentric control. Patient does have intermittent R>L thigh pain and R ankle pain. We have integrated R ankle isotonics and discussed proper footwear for medial ankle pain possibly associated with posterior tibial tendonitis. Patient's FOTO has minimally changed over last two goal updates. She does have improved strength and  improved gait pattern over the previous month. Patient reports difficulty with prolonged standing/walking/weightbearing activity. Her new home is one-level, but she is limited with stepping  up onto curbs/stairs in her community. Pt seldom uses AD in her home, but she does use rollator for community mobility so she can sit as needed.  Patient has remaining deficits in LE strength, postural control on either LE, and variable thigh/knee pain with associated activity limitations in transferring, gait, and obstacle/stair/curb negotiation. Pt will continue to benefit from skilled PT services to address her noted deficits and improve function.     GOALS:  SHORT TERM GOALS: Target date: 04/15/22   Pt will be independent with HEP in order to improve strength and balance in order to decrease fall risk and improve function at home. Baseline: 03/15/22: pt HEP was reviewed for carryover effect.    04/26/22: pt has held off on doing HEP in the past couple of weeks with recently moving and latest episode of chronic low back pain.  05/31/22: Pt is compliant with HEP Goal status: ACHIEVED      LONG TERM GOALS: Target date: 06/30/22   Pt will increase FOTO to at least 61 to demonstrate significant improvement in function at home related to balance and pain. Baseline: 03/15/22: 60 (FOTO adjusted score of 46).   04/26/22: 55.   05/31/22: 57/61.   06/23/22: 57/61.  Goal status: ON-GOING   2.  Pt will increase in R knee extension MMT strength for ability to tolerate standing/walking demands needed for negotiating community ambulation. Baseline: 03/15/22: 3*/5      04/26/22: 3*/5   05/31/22: 5-/5  Goal status: ACHIEVED    3.  Pt will decrease time of performance of 5TSTS by 4 seconds to demonstrate improved ability to transfer to-and-from commode with decreased difficulty.  Baseline: 03/15/22: Unable to accomplish 5TSTS without arm rests    04/26/22: pt performed 5TSTS in 17.73 sec.     05/31/22: 15 sec   06/23/22: 11 sec Goal status:  ACHIEVED   4.  Pt will be able to independently perform climbing a flight of stairs with unilateral UE or less support and reciprocal pattern to demonstrate increased ability to navigate home staircase.  Baseline: 03/15/22: heavy upper extremity reliance for stair negotiation and having to perform step-to with her home's stair height.   04/26/22: moderate unilateral UE support with step-to-step pattern when ascending and descending the stairs; pt has more UE reliance in descending staircase.  05/31/22: Pt completed stair ascent and descent with intermittent bilat UE support with mild instability with reciprocal pattern and abrupt weight shift to contralateral LE; pt is safer with step-to pattern at this time.    06/23/22: Pt able to perform reciprocal ascent with unilateral UE support; poor control with descent.   Goal status: ON-GOING     PLAN: PT FREQUENCY: 2x/week   PT DURATION: 4 weeks   PLANNED INTERVENTIONS: Therapeutic exercises, Therapeutic activity, Neuromuscular re-education, Balance training, Gait training, Patient/Family education, Joint manipulation, Joint mobilization, Dry Needling, Electrical stimulation, Cryotherapy, Moist heat, and Manual therapy   PLAN FOR NEXT SESSION: Continue with closed-chain strengthening, strategies to improve weight shift without compensated Trendelenburg, postural control work and stabilization work with uneven/compliant surfaces; progress advanced-phase strengthening as tolerated    Consuela Mimes, PT, DPT [phone] 06/23/2022  9:29 AM

## 2022-06-28 ENCOUNTER — Encounter: Payer: Self-pay | Admitting: Physical Therapy

## 2022-06-28 ENCOUNTER — Ambulatory Visit: Payer: Medicare HMO | Admitting: Physical Therapy

## 2022-06-28 DIAGNOSIS — M6281 Muscle weakness (generalized): Secondary | ICD-10-CM

## 2022-06-28 DIAGNOSIS — R262 Difficulty in walking, not elsewhere classified: Secondary | ICD-10-CM

## 2022-06-28 NOTE — Therapy (Signed)
OUTPATIENT PHYSICAL THERAPY TREATMENT   Patient Name: Kristin Lopez MRN: [id number] DOB:August 02, 1965, 57 y.o., female Today's Date: 06/28/2022   END OF SESSION:   PT End of Session - 06/28/22 0853     Visit Number 21    Number of Visits 28    Date for PT Re-Evaluation 07/21/22    Authorization Type initial eval 03/15/22    Progress Note Due on Visit 10    PT Start Time 0900    PT Stop Time 0942    PT Time Calculation (min) 42 min    Equipment Utilized During Treatment Gait belt    Activity Tolerance Patient tolerated treatment well    Behavior During Therapy WFL for tasks assessed/performed             Past Medical History:  Diagnosis Date   Hyperlipidemia    Past Surgical History:  Procedure Laterality Date   ABDOMINAL HYSTERECTOMY  2021   FEMUR FRACTURE SURGERY     FEMUR IM NAIL Left 02/03/2021   Procedure: INTRAMEDULLARY (IM) RETROGRADE FEMORAL EXCHANGE NAILING;  Surgeon: Roby Lofts, MD;  Location: [facility] OR;  Service: Orthopedics;  Laterality: Left;   FEMUR IM NAIL Right 06/30/2021   Procedure: EXCHANGE NAILING FEMUR NONUNION;  Surgeon: Roby Lofts, MD;  Location: [facility] OR;  Service: Orthopedics;  Laterality: Right;   HARDWARE REMOVAL Left 02/03/2021   Procedure: HARDWARE REMOVAL;  Surgeon: Roby Lofts, MD;  Location: [facility] OR;  Service: Orthopedics;  Laterality: Left;   KNEE SURGERY     SUBMANDIBULAR GLAND EXCISION  1982   Patient Active Problem List   Diagnosis Date Noted   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 06/30/2021   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 02/03/2021   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 01/14/2021     PCP: none on file  REFERRING PROVIDER: Haddix, Gillie Manners, MD   REFERRING DIAGNOSIS: S72.302K (ICD-10-CM) - Unspecified fracture of shaft of left femur, subsequent encounter for closed fracture with nonunion    REFERRING DIAG: S72.302K (ICD-10-CM) -  Unspecified fracture of shaft of left femur, subsequent encounter for closed fracture with nonunion    THERAPY DIAG:  Difficulty in walking, not elsewhere classified  Muscle weakness (generalized)  Rationale for Evaluation and Treatment Rehabilitation  PERTINENT HISTORY: Pt reports that she typically walks without use of single point cane and only uses it for long distances and shopping. Getting up from a chair and couch is hard without pushing off from arm rest or legs. (Independent sit to stand goal). Can't sleep on right side, because hardware in distal femur is uncomfortable in this position. Some buckling but less recently. Still can't get on her knees for those type of chores. Staying in one position for long periods of time causes them pain and after a long day of being on their feet causes more pain and fatigue. R LE tends to always be numb and has some tingling but less than previous visit.      Pain: Yes  R/L thigh/knee: Now/Best 3/10, Worst 5/10    R Ankle: Now/Best 0/10, Worst 7/10 (tends to improve with repeated motion) Numbness/Tingling: Yes Recent changes in overall health/medication: No Prior history of physical therapy for present condition: Yes, previous episode of care s/p R femoral exchange nailing Falls: Has patient fallen in last 6 months? Yes, Number of falls: 2 Directional pattern for falls: No   Imaging: Yes ,  X-ray 06/30/21:  IMPRESSION: Internal fixation of incompletely healed mid femoral shaft fracture, which is in near anatomic alignment.     Prior level of function: Independent and Independent with household mobility without device Occupational demands: -    Precautions: None   Weight Bearing Restrictions: No   Living Environment Lives with: lives with their family Lives in: House/apartment ; Pt reports having13 steps and uses railing. Has following equipment at home: Single point cane, grab bars in the shower, and raised commode over the toilet.  Some uneven surfaces getting into home that she has to pay attention to decrease occurrence of falls.      Patient Goals: Patient would like to be able to run and exercise, move more, and be able to lift more than 40 lbs. Pt would ike to get back to dancing with a little more ease.    PRECAUTIONS: none    SUBJECTIVE:                                                                                                                                                                                      SUBJECTIVE STATEMENT: Pt reports no notable pain at arrival. She reports ongoing compliance with HEP. Patient reports not using AD in her home at this time, but bringing her walker into community given limitation with tolerance of prolonged standing.     PAIN:  Are you having pain? No   OBJECTIVE: (objective measures completed at initial evaluation unless otherwise dated)   Patient Surveys  FOTO: Pt reported 47 (FOTO risk adjusted 46),predicted improvement to 85 ABC: deferred     Cognition Patient is oriented to person, place, and time.  Recent memory is intact.  Remote memory is intact.  Attention span and concentration are intact.  Expressive speech is intact.  Patient's fund of knowledge is within normal limits for educational level.                            Gross Musculoskeletal Assessment Tremor: None Bulk: Normal Tone: Normal     GAIT: Distance walked: deferred Assistive device utilized: Single point cane used for longer distances Level of assistance: Complete Independence Comments: lateral trunk sway, decreased step length, decreased R knee flexion in swing phase.     Posture: No gross abnormalities noted in standing or seated posture     AROM       AROM (Normal range in degrees) AROM  03/15/2022         Hip Right Left  Flexion (125) 100 110  Extension (15)      Abduction (40)  Adduction       Internal Rotation (45)      External Rotation (45)              Knee      Flexion (135) 135* WNL  Extension (0) +3 +3         Ankle      Dorsiflexion (20)      Plantarflexion (50)      Inversion (35)      Eversion (15      (* = pain; Blank rows = not tested)     PROM Hip flexion: R WNL, L WNL  Knee PROM WNL, pain with end-range R knee flexion      LE MMT:   MMT (out of 5) Right 03/15/2022 Left 03/15/2022 Right 04/26/22 Left 04/26/22 Right 05/31/22 Left 05/31/22  Hip flexion 4 4 4 4  4+ 5-  Hip extension          Hip abduction 3 3 4- 4- 4+ 4  Hip adduction (seated) 5 5      Hip internal rotation 3 3 4 3  3+ 5  Hip external rotation 5 5   5 5  4* 5-  Knee flexion 5 5 5 5 5 5   Knee extension 3* 5 3* 5 5- 4+  Ankle dorsiflexion 5 5 5 5 5 5   Ankle plantarflexion          Ankle inversion 4 5 5 5  4+ 4+  Ankle eversion          (* = pain; Blank rows = not tested)     Sensation Deferred     Reflexes Deferred       FUNCTIONAL OUTCOME MEASURES INITIAL EVAL/BASELINE   Results Comments  TUG 8.66 seconds    5TSTS Deferred due to deficit in sit to stand performance    (Blank rows = not tested)    TODAY'S TREATMENT :    Therapeutic Exercise  - improved strength as needed to improve performance of CKC activities/functional movements, for improved ability to perform stair negotiation and transferring, exercise to improve LE kinetic chain stability  NuStep Seat 8 handles 8  Resistance 6 - 5 for warm-up to for 45 secs to  improve muscle performance and improve mobility and 5 mins for  the rest of the time.   -High knee along blue agility ladder; x5 D/B with 4-lb ankle weights   - Banded lateral stepping with Black Tband on agility ladder 1 x 60 secs. length of the agility ladder  -TRX single-limb minisquat attempted -TRX reverse lunge; 2x10 on each LE    -Tandem walk on long Airex pad; 5x D/B, forward stepping   -In // bars, intermittent touch on bar as needed -Total Gym single-limb minisquat, Level 22; performed  bilaterally for 2x10 -Forward step up with contralateral LE tap on second step; 2x10, staircase in center of gym   PATIENT EDU: Discussed recourses available online to get suspension straps for advanced HEP. We discussed current PT progress.    *next visit* - Bosu ball balancing 1 x 60 secs    *not today*  -Toe tapping; 12-inch step; 2x10, alternating  -Negotiation of flight of stairs outside in parking lot; up/down x 2  -Single hurdle step (one step over 6-inch hurdle and then return to standing with hurdle in front); 2x10 with ea LE with mirror feedback to reduce compensatory sidebend/compensated Trendelenburg  -Hamstring stretches 2 x 60 secs  -Calf stretch 2 x 30 secs -Toe tap with  mirror feedback to reduce compensatory lateral flexion; 2x10 alternating  - Sit-to-stand from chair; 2x12 with 6-lb Medball goblet hold   -Tandem stance on Airex; 2x30 sec with each LE posterior -Long Airex Tandem walk; 2x D/B with multiple touches on armrest due to LOB -Unipedal stance, multiple attempts with 2 sets of 10-sec holds obtained on either LE - Step-ups on 6 inch step and Airex pad; 2x10 bilateral  - used unilateral UE support and minimal cueing on holding single limb stance - single limb stance hold;  2x10 with 10 sec hold - Banded lateral stepping with Red Theraband on agility ladder 2 D/B length of the agility ladder  - limited by fatigue and ache in knees - Mini squat in pain free range; 1x10  - forward mini lunge, adjacent to treadmill armrest as needed for balance; 1x10  - 3-way hip exercise with 4lbs. 3x12 bilateral - used unilateral UE support with minimal cueing for slow and controlled movements - side-lying hip abduction; 2x10 bilateral - prone hip extension; 2x10 bilateral - reverse clamshells; 2x10 bilateral - Total gym double-limb squat; 2x10  - moderate cueing on keeping heels on the platform and widening stance - Tandem stance on Airex: 4x1 minute (RLE behind and then  switched to LLE)    PATIENT EDUCATION:  Education details: see above for patient education details Person educated: Patient Education method: Explanation Education comprehension: verbalized understanding     HOME EXERCISE PROGRAM: Access Code: [email or web address] URL: [email or web address] Date: 03/17/2022 Prepared by: Consuela Mimes  Exercises - Supine Active Straight Leg Raise  - 2 x daily - 7 x weekly - 2 sets - 10 reps - Supine Bridge  - 2 x daily - 7 x weekly - 2 sets - 10 reps - Standing Anterior Toe Taps  - 2 x daily - 7 x weekly - 2 sets - 10 reps - Standing 3-Way Kick  - 2 x daily - 7 x weekly - 2 sets - 10 reps - Side Stepping with Counter Support  - 2 x daily - 7 x weekly - 2 sets - 10 reps - Mini Squat with Counter Support  - 2 x daily - 7 x weekly - 2 sets - 10 reps     ASSESSMENT:   CLINICAL IMPRESSION: We added further drills today to improve patient's control with single-limb lowering. She has improved gait pattern, but performance of reciprocal stair negotiation is still notably challenged with difficulty with full unipedal weightbearing and single-limb postural control. Pt is not yet able to perform single-limb minisquat with TRX straps/suspension system. We will continue working on advanced strengthening to improve patient's ability to tolerate higher volume of standing/walking/weightbearing and stair use.  Patient has remaining deficits in LE strength, postural control on either LE, and variable thigh/knee pain with associated activity limitations in transferring, gait, and obstacle/stair/curb negotiation. Pt will continue to benefit from skilled PT services to address her noted deficits and improve function.     GOALS:  SHORT TERM GOALS: Target date: 04/15/22   Pt will be independent with HEP in order to improve strength and balance in order to decrease fall risk and improve function at home. Baseline: 03/15/22: pt HEP was reviewed for carryover effect.     04/26/22: pt has held off on doing HEP in the past couple of weeks with recently moving and latest episode of chronic low back pain.  05/31/22: Pt is compliant with HEP Goal status: ACHIEVED      LONG TERM GOALS: Target date:  06/30/22   Pt will increase FOTO to at least 61 to demonstrate significant improvement in function at home related to balance and pain. Baseline: 03/15/22: 60 (FOTO adjusted score of 46).   04/26/22: 55.   05/31/22: 57/61.   06/23/22: 57/61.  Goal status: ON-GOING   2.  Pt will increase in R knee extension MMT strength for ability to tolerate standing/walking demands needed for negotiating community ambulation. Baseline: 03/15/22: 3*/5      04/26/22: 3*/5   05/31/22: 5-/5  Goal status: ACHIEVED    3.  Pt will decrease time of performance of 5TSTS by 4 seconds to demonstrate improved ability to transfer to-and-from commode with decreased difficulty.  Baseline: 03/15/22: Unable to accomplish 5TSTS without arm rests    04/26/22: pt performed 5TSTS in 17.73 sec.     05/31/22: 15 sec   06/23/22: 11 sec Goal status: ACHIEVED   4.  Pt will be able to independently perform climbing a flight of stairs with unilateral UE or less support and reciprocal pattern to demonstrate increased ability to navigate home staircase.  Baseline: 03/15/22: heavy upper extremity reliance for stair negotiation and having to perform step-to with her home's stair height.   04/26/22: moderate unilateral UE support with step-to-step pattern when ascending and descending the stairs; pt has more UE reliance in descending staircase.  05/31/22: Pt completed stair ascent and descent with intermittent bilat UE support with mild instability with reciprocal pattern and abrupt weight shift to contralateral LE; pt is safer with step-to pattern at this time.    06/23/22: Pt able to perform reciprocal ascent with unilateral UE support; poor control with descent.   Goal status: ON-GOING     PLAN: PT FREQUENCY: 2x/week   PT DURATION: 4  weeks   PLANNED INTERVENTIONS: Therapeutic exercises, Therapeutic activity, Neuromuscular re-education, Balance training, Gait training, Patient/Family education, Joint manipulation, Joint mobilization, Dry Needling, Electrical stimulation, Cryotherapy, Moist heat, and Manual therapy   PLAN FOR NEXT SESSION: Continue with closed-chain strengthening, strategies to improve weight shift without compensated Trendelenburg, postural control work and stabilization work with uneven/compliant surfaces; progress advanced-phase strengthening as tolerated    Consuela Mimes, PT, DPT [phone] 06/28/2022  8:54 AM

## 2022-07-05 ENCOUNTER — Ambulatory Visit: Payer: Medicare HMO | Attending: Student | Admitting: Physical Therapy

## 2022-07-05 ENCOUNTER — Telehealth: Payer: Self-pay

## 2022-07-05 DIAGNOSIS — R262 Difficulty in walking, not elsewhere classified: Secondary | ICD-10-CM | POA: Insufficient documentation

## 2022-07-05 DIAGNOSIS — M6281 Muscle weakness (generalized): Secondary | ICD-10-CM | POA: Insufficient documentation

## 2022-07-05 NOTE — Telephone Encounter (Signed)
Pt did not show for her 1:00pm appointment for physical therapy this date. Author called to inquire. Pt reports she threw her back out and is not able to come in. She reports attempting to call clinic earlier to cancel but no one answered the telephone. Pt made aware of next scheduled appointment, asked to call if she feels unable to come in.  1:20 PM, 07/05/22 Rosamaria Lints, PT, DPT Physical Therapist - [facility]  [phone] [facility])

## 2022-07-07 ENCOUNTER — Ambulatory Visit: Payer: Medicare HMO | Admitting: Physical Therapy

## 2022-07-07 NOTE — Therapy (Deleted)
OUTPATIENT PHYSICAL THERAPY TREATMENT   Patient Name: Kristin Lopez MRN: [id number] DOB:18-Nov-1965, 57 y.o., female Today's Date: 07/07/2022   END OF SESSION:     Past Medical History:  Diagnosis Date   Hyperlipidemia    Past Surgical History:  Procedure Laterality Date   ABDOMINAL HYSTERECTOMY  2021   FEMUR FRACTURE SURGERY     FEMUR IM NAIL Left 02/03/2021   Procedure: INTRAMEDULLARY (IM) RETROGRADE FEMORAL EXCHANGE NAILING;  Surgeon: Roby Lofts, MD;  Location: [facility] OR;  Service: Orthopedics;  Laterality: Left;   FEMUR IM NAIL Right 06/30/2021   Procedure: EXCHANGE NAILING FEMUR NONUNION;  Surgeon: Roby Lofts, MD;  Location: [facility] OR;  Service: Orthopedics;  Laterality: Right;   HARDWARE REMOVAL Left 02/03/2021   Procedure: HARDWARE REMOVAL;  Surgeon: Roby Lofts, MD;  Location: [facility] OR;  Service: Orthopedics;  Laterality: Left;   KNEE SURGERY     SUBMANDIBULAR GLAND EXCISION  1982   Patient Active Problem List   Diagnosis Date Noted   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 06/30/2021   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 02/03/2021   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 01/14/2021     PCP: none on file  REFERRING PROVIDER: Haddix, Gillie Manners, MD   REFERRING DIAGNOSIS: S72.302K (ICD-10-CM) - Unspecified fracture of shaft of left femur, subsequent encounter for closed fracture with nonunion    REFERRING DIAG: S72.302K (ICD-10-CM) - Unspecified fracture of shaft of left femur, subsequent encounter for closed fracture with nonunion    THERAPY DIAG:  Difficulty in walking, not elsewhere classified  Muscle weakness (generalized)  Rationale for Evaluation and Treatment Rehabilitation  PERTINENT HISTORY: Pt reports that she typically walks without use of single point cane and only uses it for long distances and shopping. Getting up from a chair and couch is hard without pushing off from  arm rest or legs. (Independent sit to stand goal). Can't sleep on right side, because hardware in distal femur is uncomfortable in this position. Some buckling but less recently. Still can't get on her knees for those type of chores. Staying in one position for long periods of time causes them pain and after a long day of being on their feet causes more pain and fatigue. R LE tends to always be numb and has some tingling but less than previous visit.      Pain: Yes  R/L thigh/knee: Now/Best 3/10, Worst 5/10    R Ankle: Now/Best 0/10, Worst 7/10 (tends to improve with repeated motion) Numbness/Tingling: Yes Recent changes in overall health/medication: No Prior history of physical therapy for present condition: Yes, previous episode of care s/p R femoral exchange nailing Falls: Has patient fallen in last 6 months? Yes, Number of falls: 2 Directional pattern for falls: No   Imaging: Yes ,   X-ray 06/30/21:  IMPRESSION: Internal fixation of incompletely healed mid femoral shaft fracture, which is in near anatomic alignment.     Prior level of function: Independent and Independent with household mobility without device Occupational demands: -    Precautions: None   Weight Bearing Restrictions: No   Living Environment Lives with: lives with their family Lives in: House/apartment ; Pt reports having13 steps and uses railing. Has following equipment at home: Single point cane, grab bars in the shower, and raised commode over the toilet. Some uneven surfaces getting into home that she has to pay attention to decrease occurrence  of falls.      Patient Goals: Patient would like to be able to run and exercise, move more, and be able to lift more than 40 lbs. Pt would ike to get back to dancing with a little more ease.    PRECAUTIONS: none    SUBJECTIVE:                                                                                                                                                                                       SUBJECTIVE STATEMENT: Pt reports no notable pain at arrival. She reports ongoing compliance with HEP. Patient reports not using AD in her home at this time, but bringing her walker into community given limitation with tolerance of prolonged standing.     PAIN:  Are you having pain? No   OBJECTIVE: (objective measures completed at initial evaluation unless otherwise dated)   Patient Surveys  FOTO: Pt reported 58 (FOTO risk adjusted 46),predicted improvement to 71 ABC: deferred     Cognition Patient is oriented to person, place, and time.  Recent memory is intact.  Remote memory is intact.  Attention span and concentration are intact.  Expressive speech is intact.  Patient's fund of knowledge is within normal limits for educational level.                            Gross Musculoskeletal Assessment Tremor: None Bulk: Normal Tone: Normal     GAIT: Distance walked: deferred Assistive device utilized: Single point cane used for longer distances Level of assistance: Complete Independence Comments: lateral trunk sway, decreased step length, decreased R knee flexion in swing phase.     Posture: No gross abnormalities noted in standing or seated posture     AROM       AROM (Normal range in degrees) AROM  03/15/2022         Hip Right Left  Flexion (125) 100 110  Extension (15)      Abduction (40)      Adduction       Internal Rotation (45)      External Rotation (45)             Knee      Flexion (135) 135* WNL  Extension (0) +3 +3         Ankle      Dorsiflexion (20)      Plantarflexion (50)      Inversion (35)      Eversion (15      (* = pain; Blank rows = not tested)     PROM Hip flexion:  R WNL, L WNL  Knee PROM WNL, pain with end-range R knee flexion      LE MMT:   MMT (out of 5) Right 03/15/2022 Left 03/15/2022 Right 04/26/22 Left 04/26/22 Right 05/31/22 Left 05/31/22  Hip flexion 4 4 4 4  4+ 5-  Hip  extension          Hip abduction 3 3 4- 4- 4+ 4  Hip adduction (seated) 5 5      Hip internal rotation 3 3 4 3  3+ 5  Hip external rotation 5 5   5 5  4* 5-  Knee flexion 5 5 5 5 5 5   Knee extension 3* 5 3* 5 5- 4+  Ankle dorsiflexion 5 5 5 5 5 5   Ankle plantarflexion          Ankle inversion 4 5 5 5  4+ 4+  Ankle eversion          (* = pain; Blank rows = not tested)     Sensation Deferred     Reflexes Deferred       FUNCTIONAL OUTCOME MEASURES INITIAL EVAL/BASELINE   Results Comments  TUG 8.66 seconds    5TSTS Deferred due to deficit in sit to stand performance    (Blank rows = not tested)    TODAY'S TREATMENT :    Therapeutic Exercise  - improved strength as needed to improve performance of CKC activities/functional movements, for improved ability to perform stair negotiation and transferring, exercise to improve LE kinetic chain stability  NuStep Seat 8 handles 8  Resistance 6 - 5 for warm-up to for 45 secs to  improve muscle performance and improve mobility and 5 mins for  the rest of the time.   -High knee along blue agility ladder; x5 D/B with 4-lb ankle weights   - Banded lateral stepping with Black Tband on agility ladder 1 x 60 secs. length of the agility ladder  -TRX single-limb minisquat attempted -TRX reverse lunge; 2x10 on each LE    -Tandem walk on long Airex pad; 5x D/B, forward stepping   -In // bars, intermittent touch on bar as needed -Total Gym single-limb minisquat, Level 22; performed bilaterally for 2x10 -Forward step up with contralateral LE tap on second step; 2x10, staircase in center of gym   PATIENT EDU: Discussed recourses available online to get suspension straps for advanced HEP. We discussed current PT progress.    *next visit* - Bosu ball balancing 1 x 60 secs    *not today*  -Toe tapping; 12-inch step; 2x10, alternating  -Negotiation of flight of stairs outside in parking lot; up/down x 2  -Single hurdle step (one step over  6-inch hurdle and then return to standing with hurdle in front); 2x10 with ea LE with mirror feedback to reduce compensatory sidebend/compensated Trendelenburg  -Hamstring stretches 2 x 60 secs  -Calf stretch 2 x 30 secs -Toe tap with mirror feedback to reduce compensatory lateral flexion; 2x10 alternating  - Sit-to-stand from chair; 2x12 with 6-lb Medball goblet hold   -Tandem stance on Airex; 2x30 sec with each LE posterior -Long Airex Tandem walk; 2x D/B with multiple touches on armrest due to LOB -Unipedal stance, multiple attempts with 2 sets of 10-sec holds obtained on either LE - Step-ups on 6 inch step and Airex pad; 2x10 bilateral  - used unilateral UE support and minimal cueing on holding single limb stance - single limb stance hold;  2x10 with 10 sec hold - Banded lateral stepping with Red  Theraband on agility ladder 2 D/B length of the agility ladder  - limited by fatigue and ache in knees - Mini squat in pain free range; 1x10  - forward mini lunge, adjacent to treadmill armrest as needed for balance; 1x10  - 3-way hip exercise with 4lbs. 3x12 bilateral - used unilateral UE support with minimal cueing for slow and controlled movements - side-lying hip abduction; 2x10 bilateral - prone hip extension; 2x10 bilateral - reverse clamshells; 2x10 bilateral - Total gym double-limb squat; 2x10  - moderate cueing on keeping heels on the platform and widening stance - Tandem stance on Airex: 4x1 minute (RLE behind and then switched to LLE)    PATIENT EDUCATION:  Education details: see above for patient education details Person educated: Patient Education method: Explanation Education comprehension: verbalized understanding     HOME EXERCISE PROGRAM: Access Code: [email or web address] URL: [email or web address] Date: 03/17/2022 Prepared by: Consuela Mimes  Exercises - Supine Active Straight Leg Raise  - 2 x daily - 7 x weekly - 2 sets - 10 reps - Supine Bridge  - 2 x daily  - 7 x weekly - 2 sets - 10 reps - Standing Anterior Toe Taps  - 2 x daily - 7 x weekly - 2 sets - 10 reps - Standing 3-Way Kick  - 2 x daily - 7 x weekly - 2 sets - 10 reps - Side Stepping with Counter Support  - 2 x daily - 7 x weekly - 2 sets - 10 reps - Mini Squat with Counter Support  - 2 x daily - 7 x weekly - 2 sets - 10 reps     ASSESSMENT:   CLINICAL IMPRESSION: We added further drills today to improve patient's control with single-limb lowering. She has improved gait pattern, but performance of reciprocal stair negotiation is still notably challenged with difficulty with full unipedal weightbearing and single-limb postural control. Pt is not yet able to perform single-limb minisquat with TRX straps/suspension system. We will continue working on advanced strengthening to improve patient's ability to tolerate higher volume of standing/walking/weightbearing and stair use.  Patient has remaining deficits in LE strength, postural control on either LE, and variable thigh/knee pain with associated activity limitations in transferring, gait, and obstacle/stair/curb negotiation. Pt will continue to benefit from skilled PT services to address her noted deficits and improve function.     GOALS:  SHORT TERM GOALS: Target date: 04/15/22   Pt will be independent with HEP in order to improve strength and balance in order to decrease fall risk and improve function at home. Baseline: 03/15/22: pt HEP was reviewed for carryover effect.    04/26/22: pt has held off on doing HEP in the past couple of weeks with recently moving and latest episode of chronic low back pain.  05/31/22: Pt is compliant with HEP Goal status: ACHIEVED      LONG TERM GOALS: Target date: 06/30/22   Pt will increase FOTO to at least 61 to demonstrate significant improvement in function at home related to balance and pain. Baseline: 03/15/22: 60 (FOTO adjusted score of 46).   04/26/22: 55.   05/31/22: 57/61.   06/23/22: 57/61.  Goal  status: ON-GOING   2.  Pt will increase in R knee extension MMT strength for ability to tolerate standing/walking demands needed for negotiating community ambulation. Baseline: 03/15/22: 3*/5      04/26/22: 3*/5   05/31/22: 5-/5  Goal status: ACHIEVED    3.  Pt will decrease time of performance of  5TSTS by 4 seconds to demonstrate improved ability to transfer to-and-from commode with decreased difficulty.  Baseline: 03/15/22: Unable to accomplish 5TSTS without arm rests    04/26/22: pt performed 5TSTS in 17.73 sec.     05/31/22: 15 sec   06/23/22: 11 sec Goal status: ACHIEVED   4.  Pt will be able to independently perform climbing a flight of stairs with unilateral UE or less support and reciprocal pattern to demonstrate increased ability to navigate home staircase.  Baseline: 03/15/22: heavy upper extremity reliance for stair negotiation and having to perform step-to with her home's stair height.   04/26/22: moderate unilateral UE support with step-to-step pattern when ascending and descending the stairs; pt has more UE reliance in descending staircase.  05/31/22: Pt completed stair ascent and descent with intermittent bilat UE support with mild instability with reciprocal pattern and abrupt weight shift to contralateral LE; pt is safer with step-to pattern at this time.    06/23/22: Pt able to perform reciprocal ascent with unilateral UE support; poor control with descent.   Goal status: ON-GOING     PLAN: PT FREQUENCY: 2x/week   PT DURATION: 4 weeks   PLANNED INTERVENTIONS: Therapeutic exercises, Therapeutic activity, Neuromuscular re-education, Balance training, Gait training, Patient/Family education, Joint manipulation, Joint mobilization, Dry Needling, Electrical stimulation, Cryotherapy, Moist heat, and Manual therapy   PLAN FOR NEXT SESSION: Continue with closed-chain strengthening, strategies to improve weight shift without compensated Trendelenburg, postural control work and stabilization work  with uneven/compliant surfaces; progress advanced-phase strengthening as tolerated    Consuela Mimes, PT, DPT [phone] 07/07/2022  9:41 AM

## 2022-07-11 ENCOUNTER — Ambulatory Visit: Payer: Medicare HMO | Admitting: Physical Therapy

## 2022-07-11 NOTE — Therapy (Deleted)
OUTPATIENT PHYSICAL THERAPY TREATMENT   Patient Name: Kristin Lopez MRN: [id number] DOB:[birth year], 57 y.o., female Today's Date: 07/11/2022   END OF SESSION:     Past Medical History:  Diagnosis Date   Hyperlipidemia    Past Surgical History:  Procedure Laterality Date   ABDOMINAL HYSTERECTOMY  2021   FEMUR FRACTURE SURGERY     FEMUR IM NAIL Left 02/03/2021   Procedure: INTRAMEDULLARY (IM) RETROGRADE FEMORAL EXCHANGE NAILING;  Surgeon: Roby Lofts, MD;  Location: [facility] OR;  Service: Orthopedics;  Laterality: Left;   FEMUR IM NAIL Right 06/30/2021   Procedure: EXCHANGE NAILING FEMUR NONUNION;  Surgeon: Roby Lofts, MD;  Location: [facility] OR;  Service: Orthopedics;  Laterality: Right;   HARDWARE REMOVAL Left 02/03/2021   Procedure: HARDWARE REMOVAL;  Surgeon: Roby Lofts, MD;  Location: [facility] OR;  Service: Orthopedics;  Laterality: Left;   KNEE SURGERY     SUBMANDIBULAR GLAND EXCISION  1982   Patient Active Problem List   Diagnosis Date Noted   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 06/30/2021   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 02/03/2021   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 01/14/2021     PCP: none on file  REFERRING PROVIDER: Haddix, Gillie Manners, MD   REFERRING DIAGNOSIS: S72.302K (ICD-10-CM) - Unspecified fracture of shaft of left femur, subsequent encounter for closed fracture with nonunion    REFERRING DIAG: S72.302K (ICD-10-CM) - Unspecified fracture of shaft of left femur, subsequent encounter for closed fracture with nonunion    THERAPY DIAG:  Difficulty in walking, not elsewhere classified  Muscle weakness (generalized)  Rationale for Evaluation and Treatment Rehabilitation  PERTINENT HISTORY: Pt reports that she typically walks without use of single point cane and only uses it for long distances and shopping. Getting up from a chair and couch is hard without pushing off from  arm rest or legs. (Independent sit to stand goal). Can't sleep on right side, because hardware in distal femur is uncomfortable in this position. Some buckling but less recently. Still can't get on her knees for those type of chores. Staying in one position for long periods of time causes them pain and after a long day of being on their feet causes more pain and fatigue. R LE tends to always be numb and has some tingling but less than previous visit.      Pain: Yes  R/L thigh/knee: Now/Best 3/10, Worst 5/10    R Ankle: Now/Best 0/10, Worst 7/10 (tends to improve with repeated motion) Numbness/Tingling: Yes Recent changes in overall health/medication: No Prior history of physical therapy for present condition: Yes, previous episode of care s/p R femoral exchange nailing Falls: Has patient fallen in last 6 months? Yes, Number of falls: 2 Directional pattern for falls: No   Imaging: Yes ,   X-ray 06/30/21:  IMPRESSION: Internal fixation of incompletely healed mid femoral shaft fracture, which is in near anatomic alignment.     Prior level of function: Independent and Independent with household mobility without device Occupational demands: -    Precautions: None   Weight Bearing Restrictions: No   Living Environment Lives with: lives with their family Lives in: House/apartment ; Pt reports having13 steps and uses railing. Has following equipment at home: Single point cane, grab bars in the shower, and raised commode over the toilet. Some uneven surfaces getting into home that she has to pay attention to decrease occurrence  of falls.      Patient Goals: Patient would like to be able to run and exercise, move more, and be able to lift more than 40 lbs. Pt would ike to get back to dancing with a little more ease.    PRECAUTIONS: none    SUBJECTIVE:                                                                                                                                                                                       SUBJECTIVE STATEMENT: Pt reports no notable pain at arrival. She reports ongoing compliance with HEP. Patient reports not using AD in her home at this time, but bringing her walker into community given limitation with tolerance of prolonged standing.     PAIN:  Are you having pain? No   OBJECTIVE: (objective measures completed at initial evaluation unless otherwise dated)   Patient Surveys  FOTO: Pt reported 58 (FOTO risk adjusted 46),predicted improvement to 71 ABC: deferred     Cognition Patient is oriented to person, place, and time.  Recent memory is intact.  Remote memory is intact.  Attention span and concentration are intact.  Expressive speech is intact.  Patient's fund of knowledge is within normal limits for educational level.                            Gross Musculoskeletal Assessment Tremor: None Bulk: Normal Tone: Normal     GAIT: Distance walked: deferred Assistive device utilized: Single point cane used for longer distances Level of assistance: Complete Independence Comments: lateral trunk sway, decreased step length, decreased R knee flexion in swing phase.     Posture: No gross abnormalities noted in standing or seated posture     AROM       AROM (Normal range in degrees) AROM  03/15/2022         Hip Right Left  Flexion (125) 100 110  Extension (15)      Abduction (40)      Adduction       Internal Rotation (45)      External Rotation (45)             Knee      Flexion (135) 135* WNL  Extension (0) +3 +3         Ankle      Dorsiflexion (20)      Plantarflexion (50)      Inversion (35)      Eversion (15      (* = pain; Blank rows = not tested)     PROM Hip flexion:  R WNL, L WNL  Knee PROM WNL, pain with end-range R knee flexion      LE MMT:   MMT (out of 5) Right 03/15/2022 Left 03/15/2022 Right 04/26/22 Left 04/26/22 Right 05/31/22 Left 05/31/22  Hip flexion 4 4 4 4  4+ 5-  Hip  extension          Hip abduction 3 3 4- 4- 4+ 4  Hip adduction (seated) 5 5      Hip internal rotation 3 3 4 3  3+ 5  Hip external rotation 5 5   5 5  4* 5-  Knee flexion 5 5 5 5 5 5   Knee extension 3* 5 3* 5 5- 4+  Ankle dorsiflexion 5 5 5 5 5 5   Ankle plantarflexion          Ankle inversion 4 5 5 5  4+ 4+  Ankle eversion          (* = pain; Blank rows = not tested)     Sensation Deferred     Reflexes Deferred       FUNCTIONAL OUTCOME MEASURES INITIAL EVAL/BASELINE   Results Comments  TUG 8.66 seconds    5TSTS Deferred due to deficit in sit to stand performance    (Blank rows = not tested)    TODAY'S TREATMENT :    Therapeutic Exercise  - improved strength as needed to improve performance of CKC activities/functional movements, for improved ability to perform stair negotiation and transferring, exercise to improve LE kinetic chain stability  NuStep Seat 8 handles 8  Resistance 6 - 5 for warm-up to for 45 secs to  improve muscle performance and improve mobility and 5 mins for  the rest of the time.   -High knee along blue agility ladder; x5 D/B with 4-lb ankle weights   - Banded lateral stepping with Black Tband on agility ladder 1 x 60 secs. length of the agility ladder  -TRX single-limb minisquat attempted -TRX reverse lunge; 2x10 on each LE    -Tandem walk on long Airex pad; 5x D/B, forward stepping   -In // bars, intermittent touch on bar as needed -Total Gym single-limb minisquat, Level 22; performed bilaterally for 2x10 -Forward step up with contralateral LE tap on second step; 2x10, staircase in center of gym   PATIENT EDU: Discussed recourses available online to get suspension straps for advanced HEP. We discussed current PT progress.    *next visit* - Bosu ball balancing 1 x 60 secs    *not today*  -Toe tapping; 12-inch step; 2x10, alternating  -Negotiation of flight of stairs outside in parking lot; up/down x 2  -Single hurdle step (one step over  6-inch hurdle and then return to standing with hurdle in front); 2x10 with ea LE with mirror feedback to reduce compensatory sidebend/compensated Trendelenburg  -Hamstring stretches 2 x 60 secs  -Calf stretch 2 x 30 secs -Toe tap with mirror feedback to reduce compensatory lateral flexion; 2x10 alternating  - Sit-to-stand from chair; 2x12 with 6-lb Medball goblet hold   -Tandem stance on Airex; 2x30 sec with each LE posterior -Long Airex Tandem walk; 2x D/B with multiple touches on armrest due to LOB -Unipedal stance, multiple attempts with 2 sets of 10-sec holds obtained on either LE - Step-ups on 6 inch step and Airex pad; 2x10 bilateral  - used unilateral UE support and minimal cueing on holding single limb stance - single limb stance hold;  2x10 with 10 sec hold - Banded lateral stepping with Red  Theraband on agility ladder 2 D/B length of the agility ladder  - limited by fatigue and ache in knees - Mini squat in pain free range; 1x10  - forward mini lunge, adjacent to treadmill armrest as needed for balance; 1x10  - 3-way hip exercise with 4lbs. 3x12 bilateral - used unilateral UE support with minimal cueing for slow and controlled movements - side-lying hip abduction; 2x10 bilateral - prone hip extension; 2x10 bilateral - reverse clamshells; 2x10 bilateral - Total gym double-limb squat; 2x10  - moderate cueing on keeping heels on the platform and widening stance - Tandem stance on Airex: 4x1 minute (RLE behind and then switched to LLE)    PATIENT EDUCATION:  Education details: see above for patient education details Person educated: Patient Education method: Explanation Education comprehension: verbalized understanding     HOME EXERCISE PROGRAM: Access Code: [email or web address] URL: [email or web address] Date: 03/17/2022 Prepared by: Consuela Mimes  Exercises - Supine Active Straight Leg Raise  - 2 x daily - 7 x weekly - 2 sets - 10 reps - Supine Bridge  - 2 x daily  - 7 x weekly - 2 sets - 10 reps - Standing Anterior Toe Taps  - 2 x daily - 7 x weekly - 2 sets - 10 reps - Standing 3-Way Kick  - 2 x daily - 7 x weekly - 2 sets - 10 reps - Side Stepping with Counter Support  - 2 x daily - 7 x weekly - 2 sets - 10 reps - Mini Squat with Counter Support  - 2 x daily - 7 x weekly - 2 sets - 10 reps     ASSESSMENT:   CLINICAL IMPRESSION: We added further drills today to improve patient's control with single-limb lowering. She has improved gait pattern, but performance of reciprocal stair negotiation is still notably challenged with difficulty with full unipedal weightbearing and single-limb postural control. Pt is not yet able to perform single-limb minisquat with TRX straps/suspension system. We will continue working on advanced strengthening to improve patient's ability to tolerate higher volume of standing/walking/weightbearing and stair use.  Patient has remaining deficits in LE strength, postural control on either LE, and variable thigh/knee pain with associated activity limitations in transferring, gait, and obstacle/stair/curb negotiation. Pt will continue to benefit from skilled PT services to address her noted deficits and improve function.     GOALS:  SHORT TERM GOALS: Target date: 04/15/22   Pt will be independent with HEP in order to improve strength and balance in order to decrease fall risk and improve function at home. Baseline: 03/15/22: pt HEP was reviewed for carryover effect.    04/26/22: pt has held off on doing HEP in the past couple of weeks with recently moving and latest episode of chronic low back pain.  05/31/22: Pt is compliant with HEP Goal status: ACHIEVED      LONG TERM GOALS: Target date: 06/30/22   Pt will increase FOTO to at least 61 to demonstrate significant improvement in function at home related to balance and pain. Baseline: 03/15/22: 60 (FOTO adjusted score of 46).   04/26/22: 55.   05/31/22: 57/61.   06/23/22: 57/61.  Goal  status: ON-GOING   2.  Pt will increase in R knee extension MMT strength for ability to tolerate standing/walking demands needed for negotiating community ambulation. Baseline: 03/15/22: 3*/5      04/26/22: 3*/5   05/31/22: 5-/5  Goal status: ACHIEVED    3.  Pt will decrease time of performance of  5TSTS by 4 seconds to demonstrate improved ability to transfer to-and-from commode with decreased difficulty.  Baseline: 03/15/22: Unable to accomplish 5TSTS without arm rests    04/26/22: pt performed 5TSTS in 17.73 sec.     05/31/22: 15 sec   06/23/22: 11 sec Goal status: ACHIEVED   4.  Pt will be able to independently perform climbing a flight of stairs with unilateral UE or less support and reciprocal pattern to demonstrate increased ability to navigate home staircase.  Baseline: 03/15/22: heavy upper extremity reliance for stair negotiation and having to perform step-to with her home's stair height.   04/26/22: moderate unilateral UE support with step-to-step pattern when ascending and descending the stairs; pt has more UE reliance in descending staircase.  05/31/22: Pt completed stair ascent and descent with intermittent bilat UE support with mild instability with reciprocal pattern and abrupt weight shift to contralateral LE; pt is safer with step-to pattern at this time.    06/23/22: Pt able to perform reciprocal ascent with unilateral UE support; poor control with descent.   Goal status: ON-GOING     PLAN: PT FREQUENCY: 2x/week   PT DURATION: 4 weeks   PLANNED INTERVENTIONS: Therapeutic exercises, Therapeutic activity, Neuromuscular re-education, Balance training, Gait training, Patient/Family education, Joint manipulation, Joint mobilization, Dry Needling, Electrical stimulation, Cryotherapy, Moist heat, and Manual therapy   PLAN FOR NEXT SESSION: Continue with closed-chain strengthening, strategies to improve weight shift without compensated Trendelenburg, postural control work and stabilization work  with uneven/compliant surfaces; progress advanced-phase strengthening as tolerated    Consuela Mimes, PT, DPT [phone] 07/11/2022  7:32 AM

## 2022-07-12 ENCOUNTER — Encounter: Payer: Medicare HMO | Admitting: Physical Therapy

## 2022-07-12 NOTE — Therapy (Unsigned)
OUTPATIENT PHYSICAL THERAPY TREATMENT   Patient Name: Kristin Lopez MRN: [id number] DOB:12/02/1965, 57 y.o., female Today's Date: 07/13/2022   END OF SESSION:   PT End of Session - 07/13/22 0953     Visit Number 22    Number of Visits 28    Date for PT Re-Evaluation 07/21/22    Authorization Type initial eval 03/15/22    Progress Note Due on Visit 10    PT Start Time 0950    PT Stop Time 1032    PT Time Calculation (min) 42 min    Equipment Utilized During Treatment Gait belt    Activity Tolerance Patient tolerated treatment well    Behavior During Therapy WFL for tasks assessed/performed              Past Medical History:  Diagnosis Date   Hyperlipidemia    Past Surgical History:  Procedure Laterality Date   ABDOMINAL HYSTERECTOMY  2021   FEMUR FRACTURE SURGERY     FEMUR IM NAIL Left 02/03/2021   Procedure: INTRAMEDULLARY (IM) RETROGRADE FEMORAL EXCHANGE NAILING;  Surgeon: Roby Lofts, MD;  Location: [facility] OR;  Service: Orthopedics;  Laterality: Left;   FEMUR IM NAIL Right 06/30/2021   Procedure: EXCHANGE NAILING FEMUR NONUNION;  Surgeon: Roby Lofts, MD;  Location: [facility] OR;  Service: Orthopedics;  Laterality: Right;   HARDWARE REMOVAL Left 02/03/2021   Procedure: HARDWARE REMOVAL;  Surgeon: Roby Lofts, MD;  Location: [facility] OR;  Service: Orthopedics;  Laterality: Left;   KNEE SURGERY     SUBMANDIBULAR GLAND EXCISION  1982   Patient Active Problem List   Diagnosis Date Noted   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 06/30/2021   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 02/03/2021   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 01/14/2021     PCP: none on file  REFERRING PROVIDER: Haddix, Gillie Manners, MD   REFERRING DIAGNOSIS: S72.302K (ICD-10-CM) - Unspecified fracture of shaft of left femur, subsequent encounter for closed fracture with nonunion    REFERRING DIAG: S72.302K (ICD-10-CM) -  Unspecified fracture of shaft of left femur, subsequent encounter for closed fracture with nonunion    THERAPY DIAG:  Difficulty in walking, not elsewhere classified  Muscle weakness (generalized)  Rationale for Evaluation and Treatment Rehabilitation  PERTINENT HISTORY: Pt reports that she typically walks without use of single point cane and only uses it for long distances and shopping. Getting up from a chair and couch is hard without pushing off from arm rest or legs. (Independent sit to stand goal). Can't sleep on right side, because hardware in distal femur is uncomfortable in this position. Some buckling but less recently. Still can't get on her knees for those type of chores. Staying in one position for long periods of time causes them pain and after a long day of being on their feet causes more pain and fatigue. R LE tends to always be numb and has some tingling but less than previous visit.      Pain: Yes  R/L thigh/knee: Now/Best 3/10, Worst 5/10    R Ankle: Now/Best 0/10, Worst 7/10 (tends to improve with repeated motion) Numbness/Tingling: Yes Recent changes in overall health/medication: No Prior history of physical therapy for present condition: Yes, previous episode of care s/p R femoral exchange nailing Falls: Has patient fallen in last 6 months? Yes, Number of falls: 2 Directional pattern for falls: No   Imaging:  Yes ,   X-ray 06/30/21:  IMPRESSION: Internal fixation of incompletely healed mid femoral shaft fracture, which is in near anatomic alignment.     Prior level of function: Independent and Independent with household mobility without device Occupational demands: -    Precautions: None   Weight Bearing Restrictions: No   Living Environment Lives with: lives with their family Lives in: House/apartment ; Pt reports having13 steps and uses railing. Has following equipment at home: Single point cane, grab bars in the shower, and raised commode over the toilet.  Some uneven surfaces getting into home that she has to pay attention to decrease occurrence of falls.      Patient Goals: Patient would like to be able to run and exercise, move more, and be able to lift more than 40 lbs. Pt would ike to get back to dancing with a little more ease.    PRECAUTIONS: none    SUBJECTIVE:                                                                                                                                                                                      SUBJECTIVE STATEMENT: Pt reports having notable low back pain without paresthesias over this previous week. Patient reports having some pain following reggae dancing with her family. Patient reports some pain along popliteal region on L side; feels this when walking. Patient reports some increase in urinary frequency/overactive bladder when back is flared up - not as much the last couple of days. She reports her back felt better when leaning forward.     PAIN:  Are you having pain? No   OBJECTIVE: (objective measures completed at initial evaluation unless otherwise dated)   Patient Surveys  FOTO: Pt reported 33 (FOTO risk adjusted 46),predicted improvement to 104 ABC: deferred     Cognition Patient is oriented to person, place, and time.  Recent memory is intact.  Remote memory is intact.  Attention span and concentration are intact.  Expressive speech is intact.  Patient's fund of knowledge is within normal limits for educational level.                            Gross Musculoskeletal Assessment Tremor: None Bulk: Normal Tone: Normal     GAIT: Distance walked: deferred Assistive device utilized: Single point cane used for longer distances Level of assistance: Complete Independence Comments: lateral trunk sway, decreased step length, decreased R knee flexion in swing phase.     Posture: No gross abnormalities noted in standing or seated posture     AROM       AROM  (  Normal range in degrees) AROM  03/15/2022         Hip Right Left  Flexion (125) 100 110  Extension (15)      Abduction (40)      Adduction       Internal Rotation (45)      External Rotation (45)             Knee      Flexion (135) 135* WNL  Extension (0) +3 +3         Ankle      Dorsiflexion (20)      Plantarflexion (50)      Inversion (35)      Eversion (15      (* = pain; Blank rows = not tested)     PROM Hip flexion: R WNL, L WNL  Knee PROM WNL, pain with end-range R knee flexion      LE MMT:   MMT (out of 5) Right 03/15/2022 Left 03/15/2022 Right 04/26/22 Left 04/26/22 Right 05/31/22 Left 05/31/22  Hip flexion 4 4 4 4  4+ 5-  Hip extension          Hip abduction 3 3 4- 4- 4+ 4  Hip adduction (seated) 5 5      Hip internal rotation 3 3 4 3  3+ 5  Hip external rotation 5 5   5 5  4* 5-  Knee flexion 5 5 5 5 5 5   Knee extension 3* 5 3* 5 5- 4+  Ankle dorsiflexion 5 5 5 5 5 5   Ankle plantarflexion          Ankle inversion 4 5 5 5  4+ 4+  Ankle eversion          (* = pain; Blank rows = not tested)     Sensation Deferred     Reflexes Deferred       FUNCTIONAL OUTCOME MEASURES INITIAL EVAL/BASELINE   Results Comments  TUG 8.66 seconds    5TSTS Deferred due to deficit in sit to stand performance    (Blank rows = not tested)    TODAY'S TREATMENT :    Therapeutic Exercise  - improved strength as needed to improve performance of CKC activities/functional movements, for improved ability to perform stair negotiation and transferring, exercise to improve LE kinetic chain stability  NuStep Seat 8 handles 8  Resistance 6 - 5 minutes  -for mm warm up to improve muscle performance and for improved soft tissue extensibility   -High knee with opposite knee tap; 4x D/B blue ability ladder  -High knee along blue agility ladder; x5 D/B with 4-lb ankle weights   - Banded lateral stepping with Black Tband on agility ladder; length of the agility ladder; x 6  D/B  -Toe tapping; 12-inch step; 2x10, alternating; with mirror feedback to reduce compensated Trenelenburg/lateral flexion  -TRX reverse lunge; 2x10 on each LE    -Tandem walk on long Airex pad; 5x D/B, forward stepping   -In // bars, intermittent touch on bar as needed  -Total Gym single-limb minisquat, Level 22; performed bilaterally for 2x10   PATIENT EDU: We discussed trying PT for low back pain pending referral from MD. We discussed goals of PT for remainder of visits.    *next visit* -Forward step up with contralateral LE tap on second step; 2x10, staircase in center of gym - Bosu ball balancing 1 x 60 secs    *not today*  -Negotiation of flight of stairs outside in parking lot;  up/down x 2  -Single hurdle step (one step over 6-inch hurdle and then return to standing with hurdle in front); 2x10 with ea LE with mirror feedback to reduce compensatory sidebend/compensated Trendelenburg  -Hamstring stretches 2 x 60 secs  -Calf stretch 2 x 30 secs -Toe tap with mirror feedback to reduce compensatory lateral flexion; 2x10 alternating  - Sit-to-stand from chair; 2x12 with 6-lb Medball goblet hold   -Tandem stance on Airex; 2x30 sec with each LE posterior -Long Airex Tandem walk; 2x D/B with multiple touches on armrest due to LOB -Unipedal stance, multiple attempts with 2 sets of 10-sec holds obtained on either LE - Step-ups on 6 inch step and Airex pad; 2x10 bilateral  - used unilateral UE support and minimal cueing on holding single limb stance - single limb stance hold;  2x10 with 10 sec hold - Banded lateral stepping with Red Theraband on agility ladder 2 D/B length of the agility ladder  - limited by fatigue and ache in knees - Mini squat in pain free range; 1x10  - forward mini lunge, adjacent to treadmill armrest as needed for balance; 1x10  - 3-way hip exercise with 4lbs. 3x12 bilateral - used unilateral UE support with minimal cueing for slow and controlled movements -  side-lying hip abduction; 2x10 bilateral - prone hip extension; 2x10 bilateral - reverse clamshells; 2x10 bilateral - Total gym double-limb squat; 2x10  - moderate cueing on keeping heels on the platform and widening stance - Tandem stance on Airex: 4x1 minute (RLE behind and then switched to LLE)    PATIENT EDUCATION:  Education details: see above for patient education details Person educated: Patient Education method: Explanation Education comprehension: verbalized understanding     HOME EXERCISE PROGRAM: Access Code: [email or web address] URL: [email or web address] Date: 03/17/2022 Prepared by: Consuela Mimes  Exercises - Supine Active Straight Leg Raise  - 2 x daily - 7 x weekly - 2 sets - 10 reps - Supine Bridge  - 2 x daily - 7 x weekly - 2 sets - 10 reps - Standing Anterior Toe Taps  - 2 x daily - 7 x weekly - 2 sets - 10 reps - Standing 3-Way Kick  - 2 x daily - 7 x weekly - 2 sets - 10 reps - Side Stepping with Counter Support  - 2 x daily - 7 x weekly - 2 sets - 10 reps - Mini Squat with Counter Support  - 2 x daily - 7 x weekly - 2 sets - 10 reps     ASSESSMENT:   CLINICAL IMPRESSION: Patient reports flare-up of low back pain over the previous week; pt was unable to attend PT over previous week. Patient has made notable progress with tolerance of standing/ambulatory activity. She does have intermittent lower extremity pain, though pt reports minimal symptoms today with progression of exercise and emphasis on standing and closed-chain work. Patient is intending to get new referral for low back pain. We may initiate new episode of care focused on this pending new referral.  Patient has remaining deficits in LE strength, postural control on either LE, and variable thigh/knee pain with associated activity limitations in transferring, gait, and obstacle/stair/curb negotiation. Pt will continue to benefit from skilled PT services to address her noted deficits and improve  function.     GOALS:  SHORT TERM GOALS: Target date: 04/15/22   Pt will be independent with HEP in order to improve strength and balance in order to decrease fall risk and improve function at  home. Baseline: 03/15/22: pt HEP was reviewed for carryover effect.    04/26/22: pt has held off on doing HEP in the past couple of weeks with recently moving and latest episode of chronic low back pain.  05/31/22: Pt is compliant with HEP Goal status: ACHIEVED      LONG TERM GOALS: Target date: 06/30/22   Pt will increase FOTO to at least 61 to demonstrate significant improvement in function at home related to balance and pain. Baseline: 03/15/22: 60 (FOTO adjusted score of 46).   04/26/22: 55.   05/31/22: 57/61.   06/23/22: 57/61.  Goal status: ON-GOING   2.  Pt will increase in R knee extension MMT strength for ability to tolerate standing/walking demands needed for negotiating community ambulation. Baseline: 03/15/22: 3*/5      04/26/22: 3*/5   05/31/22: 5-/5  Goal status: ACHIEVED    3.  Pt will decrease time of performance of 5TSTS by 4 seconds to demonstrate improved ability to transfer to-and-from commode with decreased difficulty.  Baseline: 03/15/22: Unable to accomplish 5TSTS without arm rests    04/26/22: pt performed 5TSTS in 17.73 sec.     05/31/22: 15 sec   06/23/22: 11 sec Goal status: ACHIEVED   4.  Pt will be able to independently perform climbing a flight of stairs with unilateral UE or less support and reciprocal pattern to demonstrate increased ability to navigate home staircase.  Baseline: 03/15/22: heavy upper extremity reliance for stair negotiation and having to perform step-to with her home's stair height.   04/26/22: moderate unilateral UE support with step-to-step pattern when ascending and descending the stairs; pt has more UE reliance in descending staircase.  05/31/22: Pt completed stair ascent and descent with intermittent bilat UE support with mild instability with reciprocal pattern and  abrupt weight shift to contralateral LE; pt is safer with step-to pattern at this time.    06/23/22: Pt able to perform reciprocal ascent with unilateral UE support; poor control with descent.   Goal status: ON-GOING     PLAN: PT FREQUENCY: 2x/week   PT DURATION: 4 weeks   PLANNED INTERVENTIONS: Therapeutic exercises, Therapeutic activity, Neuromuscular re-education, Balance training, Gait training, Patient/Family education, Joint manipulation, Joint mobilization, Dry Needling, Electrical stimulation, Cryotherapy, Moist heat, and Manual therapy   PLAN FOR NEXT SESSION: Continue with closed-chain strengthening, strategies to improve weight shift without compensated Trendelenburg, postural control work and stabilization work with uneven/compliant surfaces; progress advanced-phase strengthening as tolerated    Consuela Mimes, PT, DPT [phone] 07/13/2022  12:29 PM

## 2022-07-13 ENCOUNTER — Encounter: Payer: Self-pay | Admitting: Physical Therapy

## 2022-07-13 ENCOUNTER — Ambulatory Visit: Payer: Medicare HMO | Admitting: Physical Therapy

## 2022-07-13 DIAGNOSIS — R262 Difficulty in walking, not elsewhere classified: Secondary | ICD-10-CM

## 2022-07-13 DIAGNOSIS — M6281 Muscle weakness (generalized): Secondary | ICD-10-CM | POA: Diagnosis present

## 2022-07-14 ENCOUNTER — Encounter: Payer: Medicare HMO | Admitting: Physical Therapy

## 2022-07-19 ENCOUNTER — Encounter: Payer: Medicare HMO | Admitting: Physical Therapy

## 2022-07-19 ENCOUNTER — Ambulatory Visit: Payer: Medicare HMO | Admitting: Physical Therapy

## 2022-07-21 ENCOUNTER — Encounter: Payer: Medicare HMO | Admitting: Physical Therapy

## 2022-07-21 ENCOUNTER — Ambulatory Visit: Payer: Medicare HMO | Admitting: Physical Therapy

## 2022-07-21 DIAGNOSIS — M6281 Muscle weakness (generalized): Secondary | ICD-10-CM

## 2022-07-21 DIAGNOSIS — R262 Difficulty in walking, not elsewhere classified: Secondary | ICD-10-CM | POA: Diagnosis not present

## 2022-07-21 NOTE — Therapy (Signed)
OUTPATIENT PHYSICAL THERAPY TREATMENT   Patient Name: Kristin Lopez MRN: [id number] DOB:1965/05/12, 57 y.o., female Today's Date: 07/21/2022   END OF SESSION:   PT End of Session - 07/21/22 1443     Visit Number 23    Number of Visits 28    Date for PT Re-Evaluation 07/21/22    Authorization Type initial eval 03/15/22    Progress Note Due on Visit 10    PT Start Time 1432    PT Stop Time 1514    PT Time Calculation (min) 42 min    Equipment Utilized During Treatment Gait belt    Activity Tolerance Patient tolerated treatment well    Behavior During Therapy WFL for tasks assessed/performed               Past Medical History:  Diagnosis Date   Hyperlipidemia    Past Surgical History:  Procedure Laterality Date   ABDOMINAL HYSTERECTOMY  2021   FEMUR FRACTURE SURGERY     FEMUR IM NAIL Left 02/03/2021   Procedure: INTRAMEDULLARY (IM) RETROGRADE FEMORAL EXCHANGE NAILING;  Surgeon: Roby Lofts, MD;  Location: [facility] OR;  Service: Orthopedics;  Laterality: Left;   FEMUR IM NAIL Right 06/30/2021   Procedure: EXCHANGE NAILING FEMUR NONUNION;  Surgeon: Roby Lofts, MD;  Location: [facility] OR;  Service: Orthopedics;  Laterality: Right;   HARDWARE REMOVAL Left 02/03/2021   Procedure: HARDWARE REMOVAL;  Surgeon: Roby Lofts, MD;  Location: [facility] OR;  Service: Orthopedics;  Laterality: Left;   KNEE SURGERY     SUBMANDIBULAR GLAND EXCISION  1982   Patient Active Problem List   Diagnosis Date Noted   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 06/30/2021   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 02/03/2021   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 01/14/2021     PCP: none on file  REFERRING PROVIDER: Haddix, Gillie Manners, MD   REFERRING DIAGNOSIS: S72.302K (ICD-10-CM) - Unspecified fracture of shaft of left femur, subsequent encounter for closed fracture with nonunion    REFERRING DIAG: S72.302K (ICD-10-CM) -  Unspecified fracture of shaft of left femur, subsequent encounter for closed fracture with nonunion    THERAPY DIAG:  Difficulty in walking, not elsewhere classified  Muscle weakness (generalized)  Rationale for Evaluation and Treatment Rehabilitation  PERTINENT HISTORY: Pt reports that she typically walks without use of single point cane and only uses it for long distances and shopping. Getting up from a chair and couch is hard without pushing off from arm rest or legs. (Independent sit to stand goal). Can't sleep on right side, because hardware in distal femur is uncomfortable in this position. Some buckling but less recently. Still can't get on her knees for those type of chores. Staying in one position for long periods of time causes them pain and after a long day of being on their feet causes more pain and fatigue. R LE tends to always be numb and has some tingling but less than previous visit.      Pain: Yes  R/L thigh/knee: Now/Best 3/10, Worst 5/10    R Ankle: Now/Best 0/10, Worst 7/10 (tends to improve with repeated motion) Numbness/Tingling: Yes Recent changes in overall health/medication: No Prior history of physical therapy for present condition: Yes, previous episode of care s/p R femoral exchange nailing Falls: Has patient fallen in last 6 months? Yes, Number of falls: 2 Directional pattern for falls: No  Imaging: Yes ,   X-ray 06/30/21:  IMPRESSION: Internal fixation of incompletely healed mid femoral shaft fracture, which is in near anatomic alignment.     Prior level of function: Independent and Independent with household mobility without device Occupational demands: -    Precautions: None   Weight Bearing Restrictions: No   Living Environment Lives with: lives with their family Lives in: House/apartment ; Pt reports having13 steps and uses railing. Has following equipment at home: Single point cane, grab bars in the shower, and raised commode over the toilet.  Some uneven surfaces getting into home that she has to pay attention to decrease occurrence of falls.      Patient Goals: Patient would like to be able to run and exercise, move more, and be able to lift more than 40 lbs. Pt would ike to get back to dancing with a little more ease.    PRECAUTIONS: none    SUBJECTIVE:                                                                                                                                                                                      SUBJECTIVE STATEMENT: Pt missed PT due to low back pain flare-up; she states this is constant and gets worse during episodic flare-ups during which she has to spend ample time lying down. She has spoken with her PCP regarding PT referral for back pain. She feels she has progressed well with LE strength/gait and seldom uses AD in her home - only uses cane for going outside of home.     PAIN:  Are you having pain? No   OBJECTIVE: (objective measures completed at initial evaluation unless otherwise dated)   Patient Surveys  FOTO: Pt reported 33 (FOTO risk adjusted 46),predicted improvement to 51 ABC: deferred     Cognition Patient is oriented to person, place, and time.  Recent memory is intact.  Remote memory is intact.  Attention span and concentration are intact.  Expressive speech is intact.  Patient's fund of knowledge is within normal limits for educational level.                            Gross Musculoskeletal Assessment Tremor: None Bulk: Normal Tone: Normal     GAIT: Distance walked: deferred Assistive device utilized: Single point cane used for longer distances Level of assistance: Complete Independence Comments: lateral trunk sway, decreased step length, decreased R knee flexion in swing phase.     Posture: No gross abnormalities noted in standing or seated posture     AROM       AROM (Normal  range in degrees) AROM  03/15/2022         Hip Right Left  Flexion  (125) 100 110  Extension (15)      Abduction (40)      Adduction       Internal Rotation (45)      External Rotation (45)             Knee      Flexion (135) 135* WNL  Extension (0) +3 +3         Ankle      Dorsiflexion (20)      Plantarflexion (50)      Inversion (35)      Eversion (15      (* = pain; Blank rows = not tested)     PROM Hip flexion: R WNL, L WNL  Knee PROM WNL, pain with end-range R knee flexion      LE MMT:   MMT (out of 5) Right 03/15/2022 Left 03/15/2022 Right 04/26/22 Left 04/26/22 Right 05/31/22 Left 05/31/22  Hip flexion 4 4 4 4  4+ 5-  Hip extension          Hip abduction 3 3 4- 4- 4+ 4  Hip adduction (seated) 5 5      Hip internal rotation 3 3 4 3  3+ 5  Hip external rotation 5 5   5 5  4* 5-  Knee flexion 5 5 5 5 5 5   Knee extension 3* 5 3* 5 5- 4+  Ankle dorsiflexion 5 5 5 5 5 5   Ankle plantarflexion          Ankle inversion 4 5 5 5  4+ 4+  Ankle eversion          (* = pain; Blank rows = not tested)     Sensation Deferred     Reflexes Deferred       FUNCTIONAL OUTCOME MEASURES INITIAL EVAL/BASELINE   Results Comments  TUG 8.66 seconds    5TSTS Deferred due to deficit in sit to stand performance    (Blank rows = not tested)    TODAY'S TREATMENT :    Therapeutic Exercise  - improved strength as needed to improve performance of CKC activities/functional movements, for improved ability to perform stair negotiation and transferring, exercise to improve LE kinetic chain stability  NuStep Seat 8 handles 8  Resistance 6 - 5 minutes   -for mm warm up to improve muscle performance and for improved soft tissue extensibility   -MHP (unbilled) utilized during warm-up for analgesic effect and improved soft tissue extensibility, along low back x 5 minutes.  -High knee with opposite knee tap; 4x D/B blue ability ladder  -High knee along blue agility ladder; x5 D/B with 4-lb ankle weights   - Banded lateral stepping with Black Tband on  agility ladder; length of the agility ladder; x 6 D/B  -Toe tapping; 12-inch step; 2x10, alternating; with mirror feedback to reduce compensated Trenelenburg/lateral flexion  -TRX reverse lunge; 2x10 on each LE    -Tandem walk on long Airex pad; 5x D/B, forward stepping   -In // bars, intermittent touch on bar as needed  -Total Gym single-limb minisquat, Level 22; performed bilaterally for 2x10   PATIENT EDU: We discussed trying PT for low back pain pending referral from MD. We discussed goals of PT for remainder of visits.    *next visit* -Forward step up with contralateral LE tap on second step; 2x10, staircase in center of gym - Bosu  ball balancing 1 x 60 secs    *not today*  -Negotiation of flight of stairs outside in parking lot; up/down x 2  -Single hurdle step (one step over 6-inch hurdle and then return to standing with hurdle in front); 2x10 with ea LE with mirror feedback to reduce compensatory sidebend/compensated Trendelenburg  -Hamstring stretches 2 x 60 secs  -Calf stretch 2 x 30 secs -Toe tap with mirror feedback to reduce compensatory lateral flexion; 2x10 alternating  - Sit-to-stand from chair; 2x12 with 6-lb Medball goblet hold   -Tandem stance on Airex; 2x30 sec with each LE posterior -Long Airex Tandem walk; 2x D/B with multiple touches on armrest due to LOB -Unipedal stance, multiple attempts with 2 sets of 10-sec holds obtained on either LE - Step-ups on 6 inch step and Airex pad; 2x10 bilateral  - used unilateral UE support and minimal cueing on holding single limb stance - single limb stance hold;  2x10 with 10 sec hold - Banded lateral stepping with Red Theraband on agility ladder 2 D/B length of the agility ladder  - limited by fatigue and ache in knees - Mini squat in pain free range; 1x10  - forward mini lunge, adjacent to treadmill armrest as needed for balance; 1x10  - 3-way hip exercise with 4lbs. 3x12 bilateral - used unilateral UE support with  minimal cueing for slow and controlled movements - side-lying hip abduction; 2x10 bilateral - prone hip extension; 2x10 bilateral - reverse clamshells; 2x10 bilateral - Total gym double-limb squat; 2x10  - moderate cueing on keeping heels on the platform and widening stance - Tandem stance on Airex: 4x1 minute (RLE behind and then switched to LLE)    PATIENT EDUCATION:  Education details: see above for patient education details Person educated: Patient Education method: Explanation Education comprehension: verbalized understanding     HOME EXERCISE PROGRAM: Access Code: [email or web address] URL: [email or web address] Date: 03/17/2022 Prepared by: Consuela Mimes  Exercises - Supine Active Straight Leg Raise  - 2 x daily - 7 x weekly - 2 sets - 10 reps - Supine Bridge  - 2 x daily - 7 x weekly - 2 sets - 10 reps - Standing Anterior Toe Taps  - 2 x daily - 7 x weekly - 2 sets - 10 reps - Standing 3-Way Kick  - 2 x daily - 7 x weekly - 2 sets - 10 reps - Side Stepping with Counter Support  - 2 x daily - 7 x weekly - 2 sets - 10 reps - Mini Squat with Counter Support  - 2 x daily - 7 x weekly - 2 sets - 10 reps     ASSESSMENT:   CLINICAL IMPRESSION: Patient unfortunately has missed multiple visits due to episodic low back pain flare-ups. She participates very well with PT and demonstrates good faith effort; she tolerates exercises well today after utilizing moist heat for analgesic effect for low back. She has made good progress with current goals of PT for maximizing function, weaning from AD, and improving ability to negotiate stairs/obstacles following bilat femoral ORIF revision surgeries.  Patient has remaining deficits in LE strength, postural control on either LE, and variable thigh/knee pain with associated activity limitations in transferring, gait, and obstacle/stair/curb negotiation. Pt will continue to benefit from skilled PT services to address her noted deficits and  improve function.     GOALS:  SHORT TERM GOALS: Target date: 04/15/22   Pt will be independent with HEP in order to improve strength and balance  in order to decrease fall risk and improve function at home. Baseline: 03/15/22: pt HEP was reviewed for carryover effect.    04/26/22: pt has held off on doing HEP in the past couple of weeks with recently moving and latest episode of chronic low back pain.  05/31/22: Pt is compliant with HEP Goal status: ACHIEVED      LONG TERM GOALS: Target date: 06/30/22   Pt will increase FOTO to at least 61 to demonstrate significant improvement in function at home related to balance and pain. Baseline: 03/15/22: 60 (FOTO adjusted score of 46).   04/26/22: 55.   05/31/22: 57/61.   06/23/22: 57/61.  Goal status: ON-GOING   2.  Pt will increase in R knee extension MMT strength for ability to tolerate standing/walking demands needed for negotiating community ambulation. Baseline: 03/15/22: 3*/5      04/26/22: 3*/5   05/31/22: 5-/5  Goal status: ACHIEVED    3.  Pt will decrease time of performance of 5TSTS by 4 seconds to demonstrate improved ability to transfer to-and-from commode with decreased difficulty.  Baseline: 03/15/22: Unable to accomplish 5TSTS without arm rests    04/26/22: pt performed 5TSTS in 17.73 sec.     05/31/22: 15 sec   06/23/22: 11 sec Goal status: ACHIEVED   4.  Pt will be able to independently perform climbing a flight of stairs with unilateral UE or less support and reciprocal pattern to demonstrate increased ability to navigate home staircase.  Baseline: 03/15/22: heavy upper extremity reliance for stair negotiation and having to perform step-to with her home's stair height.   04/26/22: moderate unilateral UE support with step-to-step pattern when ascending and descending the stairs; pt has more UE reliance in descending staircase.  05/31/22: Pt completed stair ascent and descent with intermittent bilat UE support with mild instability with reciprocal pattern  and abrupt weight shift to contralateral LE; pt is safer with step-to pattern at this time.    06/23/22: Pt able to perform reciprocal ascent with unilateral UE support; poor control with descent.   Goal status: ON-GOING     PLAN: PT FREQUENCY: 2x/week   PT DURATION: 4 weeks   PLANNED INTERVENTIONS: Therapeutic exercises, Therapeutic activity, Neuromuscular re-education, Balance training, Gait training, Patient/Family education, Joint manipulation, Joint mobilization, Dry Needling, Electrical stimulation, Cryotherapy, Moist heat, and Manual therapy   PLAN FOR NEXT SESSION: Continue with closed-chain strengthening, strategies to improve weight shift without compensated Trendelenburg, postural control work and stabilization work with uneven/compliant surfaces; progress advanced-phase strengthening as tolerated    Consuela Mimes, PT, DPT [phone] 07/21/2022  2:44 PM

## 2022-07-26 ENCOUNTER — Ambulatory Visit: Payer: Medicare HMO | Admitting: Physical Therapy

## 2022-07-26 ENCOUNTER — Encounter: Payer: Self-pay | Admitting: Physical Therapy

## 2022-07-26 ENCOUNTER — Encounter: Payer: Medicare HMO | Admitting: Physical Therapy

## 2022-07-28 ENCOUNTER — Encounter: Payer: Medicare HMO | Admitting: Physical Therapy

## 2022-08-29 ENCOUNTER — Ambulatory Visit: Payer: Medicare HMO | Attending: Family Medicine | Admitting: Physical Therapy

## 2022-08-29 DIAGNOSIS — M6281 Muscle weakness (generalized): Secondary | ICD-10-CM | POA: Diagnosis present

## 2022-08-29 DIAGNOSIS — R262 Difficulty in walking, not elsewhere classified: Secondary | ICD-10-CM | POA: Insufficient documentation

## 2022-08-29 DIAGNOSIS — M5459 Other low back pain: Secondary | ICD-10-CM | POA: Diagnosis not present
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
## 2022-09-05 ENCOUNTER — Encounter: Payer: Self-pay | Admitting: Physical Therapy

## 2022-09-05 ENCOUNTER — Ambulatory Visit: Payer: Medicare HMO | Admitting: Physical Therapy

## 2022-09-05 DIAGNOSIS — R262 Difficulty in walking, not elsewhere classified: Secondary | ICD-10-CM

## 2022-09-05 DIAGNOSIS — M5459 Other low back pain: Secondary | ICD-10-CM | POA: Diagnosis not present

## 2022-09-05 DIAGNOSIS — M6281 Muscle weakness (generalized): Secondary | ICD-10-CM
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
## 2022-09-07 ENCOUNTER — Encounter: Payer: Self-pay | Admitting: Physical Therapy

## 2022-09-07 ENCOUNTER — Ambulatory Visit: Payer: Medicare HMO | Admitting: Physical Therapy

## 2022-09-07 DIAGNOSIS — M5459 Other low back pain: Secondary | ICD-10-CM

## 2022-09-07 DIAGNOSIS — R262 Difficulty in walking, not elsewhere classified: Secondary | ICD-10-CM

## 2022-09-07 DIAGNOSIS — M6281 Muscle weakness (generalized): Secondary | ICD-10-CM
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
## 2022-09-12 ENCOUNTER — Ambulatory Visit: Payer: Medicare HMO | Admitting: Physical Therapy

## 2022-09-14 ENCOUNTER — Ambulatory Visit: Payer: Medicare HMO | Admitting: Physical Therapy

## 2022-09-19 ENCOUNTER — Encounter: Payer: Self-pay | Admitting: Physical Therapy

## 2022-09-19 ENCOUNTER — Ambulatory Visit: Payer: Medicare HMO | Admitting: Physical Therapy

## 2022-09-19 DIAGNOSIS — M5459 Other low back pain: Secondary | ICD-10-CM | POA: Diagnosis not present

## 2022-09-19 DIAGNOSIS — R262 Difficulty in walking, not elsewhere classified: Secondary | ICD-10-CM

## 2022-09-19 DIAGNOSIS — M6281 Muscle weakness (generalized): Secondary | ICD-10-CM
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
## 2022-09-21 ENCOUNTER — Ambulatory Visit: Payer: Medicare HMO | Admitting: Physical Therapy

## 2022-09-21 ENCOUNTER — Encounter: Payer: Self-pay | Admitting: Physical Therapy

## 2022-09-21 DIAGNOSIS — M6281 Muscle weakness (generalized): Secondary | ICD-10-CM

## 2022-09-21 DIAGNOSIS — M5459 Other low back pain: Secondary | ICD-10-CM | POA: Diagnosis not present

## 2022-09-21 DIAGNOSIS — R262 Difficulty in walking, not elsewhere classified: Secondary | ICD-10-CM
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
## 2022-09-26 ENCOUNTER — Ambulatory Visit: Payer: Medicare HMO | Admitting: Physical Therapy

## 2022-09-26 DIAGNOSIS — M5459 Other low back pain: Secondary | ICD-10-CM

## 2022-09-26 DIAGNOSIS — R262 Difficulty in walking, not elsewhere classified: Secondary | ICD-10-CM

## 2022-09-26 DIAGNOSIS — M6281 Muscle weakness (generalized): Secondary | ICD-10-CM
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
## 2022-09-27 ENCOUNTER — Encounter: Payer: Self-pay | Admitting: Physical Therapy

## 2022-09-28 ENCOUNTER — Ambulatory Visit: Payer: Medicare HMO | Admitting: Physical Therapy
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
## 2022-10-03 ENCOUNTER — Ambulatory Visit: Payer: Medicare HMO | Attending: Family Medicine | Admitting: Physical Therapy

## 2022-10-03 DIAGNOSIS — M5459 Other low back pain: Secondary | ICD-10-CM | POA: Diagnosis present

## 2022-10-03 DIAGNOSIS — R262 Difficulty in walking, not elsewhere classified: Secondary | ICD-10-CM | POA: Diagnosis present

## 2022-10-03 DIAGNOSIS — M6281 Muscle weakness (generalized): Secondary | ICD-10-CM | POA: Insufficient documentation
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
## 2022-10-04 ENCOUNTER — Encounter: Payer: Self-pay | Admitting: Physical Therapy

## 2022-10-05 ENCOUNTER — Ambulatory Visit: Payer: Medicare HMO | Admitting: Physical Therapy

## 2022-10-05 ENCOUNTER — Encounter: Payer: Self-pay | Admitting: Physical Therapy

## 2022-10-05 DIAGNOSIS — M5459 Other low back pain: Secondary | ICD-10-CM

## 2022-10-05 DIAGNOSIS — R262 Difficulty in walking, not elsewhere classified: Secondary | ICD-10-CM

## 2022-10-05 DIAGNOSIS — M6281 Muscle weakness (generalized): Secondary | ICD-10-CM
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
## 2022-10-10 ENCOUNTER — Encounter: Payer: Self-pay | Admitting: Physical Therapy

## 2022-10-10 ENCOUNTER — Ambulatory Visit: Payer: Medicare HMO | Admitting: Physical Therapy

## 2022-10-10 DIAGNOSIS — M5459 Other low back pain: Secondary | ICD-10-CM | POA: Diagnosis not present

## 2022-10-10 DIAGNOSIS — M6281 Muscle weakness (generalized): Secondary | ICD-10-CM

## 2022-10-10 DIAGNOSIS — R262 Difficulty in walking, not elsewhere classified: Secondary | ICD-10-CM
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
## 2022-10-12 ENCOUNTER — Ambulatory Visit: Payer: Medicare HMO | Admitting: Physical Therapy
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
## 2022-10-17 ENCOUNTER — Ambulatory Visit: Payer: Medicare HMO | Admitting: Physical Therapy
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
## 2022-10-19 ENCOUNTER — Ambulatory Visit: Payer: Medicare HMO | Admitting: Physical Therapy

## 2022-10-24 ENCOUNTER — Ambulatory Visit: Payer: Medicare HMO | Admitting: Physical Therapy

## 2022-10-24 DIAGNOSIS — M6281 Muscle weakness (generalized): Secondary | ICD-10-CM

## 2022-10-24 DIAGNOSIS — M5459 Other low back pain: Secondary | ICD-10-CM

## 2022-10-24 DIAGNOSIS — R262 Difficulty in walking, not elsewhere classified: Secondary | ICD-10-CM
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
## 2022-10-26 ENCOUNTER — Ambulatory Visit: Payer: Medicare HMO | Admitting: Physical Therapy

## 2022-10-26 ENCOUNTER — Encounter: Payer: Self-pay | Admitting: Physical Therapy

## 2022-10-26 DIAGNOSIS — M5459 Other low back pain: Secondary | ICD-10-CM

## 2022-10-26 DIAGNOSIS — R262 Difficulty in walking, not elsewhere classified: Secondary | ICD-10-CM

## 2022-10-26 DIAGNOSIS — M6281 Muscle weakness (generalized): Secondary | ICD-10-CM
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
## 2022-11-01 NOTE — Addendum Note (Signed)
Addended by: Gertie Exon on: 11/01/2022 08:36 AM   Modules accepted: Orders

## 2022-11-03 ENCOUNTER — Ambulatory Visit (INDEPENDENT_AMBULATORY_CARE_PROVIDER_SITE_OTHER): Payer: Medicare HMO | Admitting: Orthopaedic Surgery

## 2022-11-03 ENCOUNTER — Other Ambulatory Visit (HOSPITAL_BASED_OUTPATIENT_CLINIC_OR_DEPARTMENT_OTHER): Payer: Self-pay | Admitting: Orthopaedic Surgery

## 2022-11-03 DIAGNOSIS — M5459 Other low back pain: Secondary | ICD-10-CM

## 2022-11-03 DIAGNOSIS — M48062 Spinal stenosis, lumbar region with neurogenic claudication: Secondary | ICD-10-CM
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
## 2022-11-07 ENCOUNTER — Telehealth: Payer: Self-pay | Admitting: Orthopaedic Surgery

## 2022-11-07 NOTE — Telephone Encounter (Signed)
Received vm from patient. IC,lmvm for pt to [facility]. Pts phone [phone]

## 2022-12-08 ENCOUNTER — Encounter: Payer: Self-pay | Admitting: Orthopedic Surgery

## 2022-12-08 ENCOUNTER — Ambulatory Visit: Payer: Medicare HMO | Admitting: Orthopedic Surgery

## 2022-12-08 ENCOUNTER — Other Ambulatory Visit (INDEPENDENT_AMBULATORY_CARE_PROVIDER_SITE_OTHER): Payer: Medicare HMO

## 2022-12-08 VITALS — BP 120/84 | HR 78 | Ht 67.0 in | Wt 258.0 lb

## 2022-12-08 DIAGNOSIS — M48062 Spinal stenosis, lumbar region with neurogenic claudication: Secondary | ICD-10-CM

## 2022-12-08 DIAGNOSIS — M5459 Other low back pain: Secondary | ICD-10-CM

## 2022-12-08 NOTE — Progress Notes (Signed)
Orthopedic Spine Surgery Office Note  Assessment: Patient is a 57 y.o. female with chronic, progressive low back pain.  No radicular symptoms   Plan: -Patient has tried PT, oral steroids, steroid injections, Tylenol -Patient has tried multiple conservative treatments over the last year without any relief of her symptoms, so recommend MRI of the lumbar spine to evaluate further -We talked about core strengthening and weight loss as long-term treatments to help with her back pain -Patient should return to office in 5 weeks, x-rays at next visit: None   Patient expressed understanding of the plan and all questions were answered to the patient's satisfaction.   ___________________________________________________________________________   History:  Patient is a 57 y.o. female who presents today for lumbar spine.  Patient has a long history of low back pain.  She said she used to have episodes where she had the pain and then it would go away but within the last year and 1/2 to 2 years she has noted chronic low back pain.  She feels the pain on a daily basis.  She feels it in her lower lumbar region.  There is no trauma or injury that preceded the onset of pain.  She has no pain radiating into either lower extremity.  She feels the pain with activity and rest.  There is no change in her pain with flexion or extension of the lumbar spine.   Weakness: Denies Symptoms of imbalance: Denies Paresthesias and numbness: Denies Bowel or bladder incontinence: Denies Saddle anesthesia: Denies  Treatments tried: PT, oral steroids, steroid injections, Tylenol  Review of systems: Denies fevers and chills, night sweats, unexplained weight loss, history of cancer.  Has had pain that wakes her at night  Past medical history: HLD Chronic pain  Allergies: Penicillin, sulfa  Past surgical history:  Bilateral femur ORIF Hysterectomy Submandibular gland excision  Social history: Denies use of  nicotine product (smoking, vaping, patches, smokeless) Alcohol use: denies Denies recreational drug use   Physical Exam:  BMI of 40.5  General: no acute distress, appears stated age Neurologic: alert, answering questions appropriately, following commands Respiratory: unlabored breathing on room air, symmetric chest rise Psychiatric: appropriate affect, normal cadence to speech   MSK (spine):  -Strength exam      Left  Right EHL    5/5  5/5 TA    5/5  5/5 GSC    5/5  5/5 Knee extension  5/5  5/5 Hip flexion   5/5  5/5  -Sensory exam    Sensation intact to light touch in L3-S1 nerve distributions of bilateral lower extremities  -Achilles DTR: 2/4 on the left, 2/4 on the right -Patellar tendon DTR: 2/4 on the left, 2/4 on the right  -Straight leg raise: Negative bilaterally -Femoral nerve stretch test: Negative bilaterally -Clonus: no beats bilaterally  -Left hip exam: No pain through range of motion, negative Stinchfield, negative FABER, negative SI joint compression test -Right hip exam: No pain through range of motion, negative Stinchfield, negative FABER, negative SI joint compression test  Imaging: XRs of the lumbar spine from 12/08/2022 was independently reviewed and interpreted, showing disc height loss with anterior osteophyte formation at L4/5 and L5/S1.  No other significant degenerative changes seen.  No fracture or dislocation.  No evidence of instability on flexion/extension views.   Patient name: Kristin Lopez Patient MRN: [id number] Date of visit: 12/08/22

## 2023-01-05 ENCOUNTER — Ambulatory Visit: Payer: Medicare HMO | Admitting: Orthopedic Surgery

## 2023-01-17 ENCOUNTER — Ambulatory Visit: Payer: Medicare HMO

## 2023-01-24 ENCOUNTER — Ambulatory Visit
Admission: RE | Admit: 2023-01-24 | Discharge: 2023-01-24 | Disposition: A | Discharge status: Home / Self Care | Payer: Medicare HMO | Source: Ambulatory Visit | Attending: Orthopedic Surgery | Admitting: Orthopedic Surgery

## 2023-01-24 DIAGNOSIS — M5459 Other low back pain: Secondary | ICD-10-CM | POA: Diagnosis present

## 2023-05-12 ENCOUNTER — Ambulatory Visit
Admission: EM | Admit: 2023-05-12 | Discharge: 2023-05-12 | Disposition: A | Discharge status: Home / Self Care | Attending: Emergency Medicine | Admitting: Emergency Medicine

## 2023-05-12 ENCOUNTER — Encounter: Payer: Self-pay | Admitting: Emergency Medicine

## 2023-05-12 DIAGNOSIS — J069 Acute upper respiratory infection, unspecified: Secondary | ICD-10-CM

## 2023-05-12 LAB — RESP PANEL BY RT-PCR (FLU A&B, COVID) ARPGX2
Influenza A by PCR: NEGATIVE
Influenza B by PCR: NEGATIVE
SARS Coronavirus 2 by RT PCR: NEGATIVE

## 2023-05-12 LAB — GROUP A STREP BY PCR: Group A Strep by PCR: NOT DETECTED

## 2023-05-12 NOTE — ED Triage Notes (Signed)
 Patient c/o cough, nasal congestion, and sore throat that started 2 days ago.  Patient unsure of fevers.

## 2023-05-12 NOTE — ED Provider Notes (Signed)
 [facility]    CSN: [id number] Arrival date & time: 05/12/23  1021      History   Chief Complaint Chief Complaint  Patient presents with   Cough   Nasal Congestion   Sore Throat    HPI Kristin Lopez is a 58 y.o. female.   Kristin Lopez, 58 year old female, presents to urgent care for evaluation of cough, nasal congestion, sore throat that started 2 days prior, patient unsure of fevers.  Took Tylenol for symptom management.  PMH: Hyperlipidemia  The history is provided by the patient. No language interpreter was used.    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 01/14/2021    Past Surgical History:  Procedure Laterality Date   ABDOMINAL HYSTERECTOMY  2021   FEMUR FRACTURE SURGERY     FEMUR IM NAIL Left 02/03/2021   Procedure: INTRAMEDULLARY (IM) RETROGRADE FEMORAL EXCHANGE NAILING;  Surgeon: Roby Lofts, MD;  Location: [facility] OR;  Service: Orthopedics;  Laterality: Left;   FEMUR IM NAIL Right 06/30/2021   Procedure: EXCHANGE NAILING FEMUR NONUNION;  Surgeon: Roby Lofts, MD;  Location: [facility] OR;  Service: Orthopedics;  Laterality: Right;   HARDWARE REMOVAL Left 02/03/2021   Procedure: HARDWARE REMOVAL;  Surgeon: Roby Lofts, MD;  Location: [facility] OR;  Service: Orthopedics;  Laterality: Left;   KNEE SURGERY     SUBMANDIBULAR GLAND EXCISION  1982    OB History   No obstetric history on file.      Home Medications    Prior to Admission medications   Medication Sig Start Date End Date Taking? Authorizing Provider  acetaminophen (TYLENOL) 500 MG tablet Take 1 tablet (500 mg total) by mouth every 6  (six) hours as needed for mild pain, fever or headache. 07/01/21   [place] Bali, PA-C  fluticasone (FLONASE) 50 MCG/ACT nasal spray Place 2 sprays into both nostrils daily.    [provider]  lidocaine (LIDODERM) 5 % Place 1 patch onto the skin daily.    [provider]  meloxicam (MOBIC) 15 MG tablet Take 15 mg by mouth daily.    [provider]  montelukast (SINGULAIR) 10 MG tablet Take 10 mg by mouth daily. 06/18/21   [provider]  Multiple Vitamin (MULTIVITAMIN WITH MINERALS) TABS tablet Take 1 tablet by mouth daily.    [provider]  oxyCODONE-acetaminophen (PERCOCET) 5-325 MG tablet Take 1-2 tablets by mouth every 4 (four) hours as needed for severe pain or moderate pain. 07/01/21   West Bali, PA-C  tiZANidine (ZANAFLEX) 4 MG tablet Take 1 tablet (4 mg total) by mouth every 6 (six) hours as needed for muscle spasms. 07/01/21   [place] Bali, PA-C  VITAMIN D PO Take 2 capsules by mouth daily.    [provider]    Family History History reviewed. No pertinent family history.  Social History Social History   Tobacco Use   Smoking status: Never   Smokeless tobacco: Never  Vaping Use   Vaping status: Never Used  Substance Use Topics   Alcohol use: Not Currently  Drug use: Not Currently     Allergies   Penicillins, Sulfa antibiotics, and Sulfate   Review of Systems Review of Systems  HENT:  Positive for congestion and sore throat.   Respiratory:  Positive for cough.   All other systems reviewed and are negative.    Physical Exam Triage Vital Signs ED Triage Vitals  Encounter Vitals Group     BP 05/12/23 1034 121/84     Systolic BP Percentile --      Diastolic BP Percentile --      Pulse Rate 05/12/23 1034 81     Resp 05/12/23 1034 14     Temp 05/12/23 1034 98.3 F (36.8 C)     Temp Source 05/12/23 1034 Oral     SpO2 05/12/23 1034 96 %     Weight 05/12/23 1032 268 lb (121.6 kg)     Height  05/12/23 1032 5\' 7"  (1.702 m)     Head Circumference --      Peak Flow --      Pain Score 05/12/23 1031 7     Pain Loc --      Pain Education --      Exclude from Growth Chart --    No data found.  Updated Vital Signs BP 121/84 (BP Location: Left Arm)   Pulse 81   Temp 98.3 F (36.8 C) (Oral)   Resp 14   Ht 5\' 7"  (1.702 m)   Wt 268 lb (121.6 kg)   SpO2 96%   BMI 41.97 kg/m   Visual Acuity Right Eye Distance:   Left Eye Distance:   Bilateral Distance:    Right Eye Near:   Left Eye Near:    Bilateral Near:     Physical Exam Vitals and nursing note reviewed.  Constitutional:      General: She is not in acute distress.    Appearance: She is well-developed.  HENT:     Head: Normocephalic.     Right Ear: Tympanic membrane is retracted.     Left Ear: Tympanic membrane is retracted.     Nose: Mucosal edema and congestion present.     Right Sinus: No maxillary sinus tenderness or frontal sinus tenderness.     Left Sinus: No maxillary sinus tenderness or frontal sinus tenderness.     Mouth/Throat:     Lips: Pink.     Mouth: Mucous membranes are moist.     Pharynx: Oropharynx is clear. Uvula midline.  Eyes:     General: Lids are normal.     Conjunctiva/sclera: Conjunctivae normal.     Pupils: Pupils are equal, round, and reactive to light.  Neck:     Trachea: No tracheal deviation.  Cardiovascular:     Rate and Rhythm: Normal rate and regular rhythm.     Heart sounds: Normal heart sounds. No murmur heard. Pulmonary:     Effort: Pulmonary effort is normal.     Breath sounds: Normal breath sounds and air entry.  Abdominal:     General: Bowel sounds are normal.     Palpations: Abdomen is soft.     Tenderness: There is no abdominal tenderness.  Musculoskeletal:        General: Normal range of motion.     Cervical back: Normal range of motion.  Lymphadenopathy:     Cervical: No cervical adenopathy.  Skin:    General: Skin is warm and dry.     Findings: No rash.   Neurological:  General: No focal deficit present.     Mental Status: She is alert and oriented to person, place, and time.     GCS: GCS eye subscore is 4. GCS verbal subscore is 5. GCS motor subscore is 6.  Psychiatric:        Attention and Perception: Attention normal.        Mood and Affect: Mood normal.        Speech: Speech normal.        Behavior: Behavior normal. Behavior is cooperative.      UC Treatments / Results  Labs (all labs ordered are listed, but only abnormal results are displayed) Labs Reviewed  GROUP A STREP BY PCR  RESP PANEL BY RT-PCR (FLU A&B, COVID) [id number]    EKG   Radiology No results found.  Procedures Procedures (including critical care time)  Medications Ordered in UC Medications - No data to display  Initial Impression / Assessment and Plan / UC Course  I have reviewed the triage vital signs and the nursing notes.  Pertinent labs & imaging results that were available during my care of the patient were reviewed by me and considered in my medical decision making (see chart for details).  Clinical Course as of 05/12/23 1257  Fri May 12, 2023  1124 Covid,flu,strep negative [JD]    Clinical Course User Index [JD] Clancy Gourd, NP  Discussed exam findings and plan of care with patient; Patient verbalized understanding to this provider.  Ddx: Viral URI with cough, allergies Final Clinical Impressions(s) / UC Diagnoses   Final diagnoses:  Viral URI with cough     Discharge Instructions      Your covid,flu,strep are all negative Most likely you have a viral illness: no antibiotic is indicated at this time, May treat with OTC meds of choice. Make sure to drink plenty of fluids to stay hydrated(gatorade, water, popsicles,jello,etc), avoid caffeine products. Follow up with PCP. Return as needed.     ED Prescriptions   None    PDMP not reviewed this encounter.   Clancy Gourd, NP 05/12/23 1257

## 2023-05-12 NOTE — Discharge Instructions (Signed)
 Your covid,flu,strep are all negative Most likely you have a viral illness: no antibiotic is indicated at this time, May treat with OTC meds of choice. Make sure to drink plenty of fluids to stay hydrated(gatorade, water, popsicles,jello,etc), avoid caffeine products. Follow up with PCP. Return as needed.

## 2023-08-01 ENCOUNTER — Other Ambulatory Visit: Payer: Self-pay | Admitting: Family Medicine

## 2023-08-01 DIAGNOSIS — Z1231 Encounter for screening mammogram for malignant neoplasm of breast: Secondary | ICD-10-CM

## 2024-01-01 ENCOUNTER — Encounter: Payer: Self-pay | Admitting: Radiology

## 2024-03-15 ENCOUNTER — Ambulatory Visit (INDEPENDENT_AMBULATORY_CARE_PROVIDER_SITE_OTHER)

## 2024-03-15 ENCOUNTER — Ambulatory Visit

## 2024-03-15 VITALS — BP 110/73 | HR 76 | Ht 68.0 in | Wt 267.0 lb

## 2024-03-15 DIAGNOSIS — M76821 Posterior tibial tendinitis, right leg: Secondary | ICD-10-CM

## 2024-03-15 DIAGNOSIS — S93431A Sprain of tibiofibular ligament of right ankle, initial encounter: Secondary | ICD-10-CM

## 2024-03-15 DIAGNOSIS — M79671 Pain in right foot: Secondary | ICD-10-CM

## 2024-03-15 DIAGNOSIS — M25571 Pain in right ankle and joints of right foot: Secondary | ICD-10-CM

## 2024-03-15 NOTE — Progress Notes (Signed)
 "  Office Visit Note   Patient: Kristin Lopez           Date of Birth: 07-30-1965           MRN: [id number] Visit Date: 03/15/2024              Requested by: Lora Odor, FNP 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 Pain in right ankle and joints of right foot   3. Sprain of tibiofibular ligament of right ankle, initial encounter     Plan: Natural history and expected course discussed. Questions answered. Patient treated for PTT insufficiency with continued pain in right foot despite conservative treatment with boot immobilization. Referred to foot and ankle specialist for evaluation. Advised to continue the boot due to the sprained ankle.  Orders:  Orders Placed This Encounter  Procedures   DG Foot Complete Right   DG Ankle Complete Right   Ambulatory referral to Podiatry     Subjective: Chief Complaint: Right ankle pain  HPI Patient is a 59 y.o. year old female who presents for follow up for PTT insufficiency treated by outside orthopaedic surgeon. Reports that she has been immobilized in the boot for the last 3 months. Continues to have pain when standing. Also reports that she twisted her ankle yesterday and has pain in the front of the ankleNo other complaints.  Objective: Vital Signs: BP 110/73   Pulse 76   Ht 5' 8 (1.727 m)   Wt 267 lb (121.1 kg)   BMI 40.60 kg/m   Physical Exam Gen: Alert, No Acute Distress right foot: Skin intact, no erythema or induration noted. PT tendon and anterior ankle joint tenderness to palpation. Range of motion -20-45.   Imaging: Radiographs personally reviewed by me; reveal no abnormalities of the ankle and foot.   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  Past Surgical History:  Procedure Laterality Date   ABDOMINAL HYSTERECTOMY  2021   FEMUR FRACTURE SURGERY     FEMUR IM NAIL Left 02/03/2021   Procedure: INTRAMEDULLARY (IM) RETROGRADE FEMORAL EXCHANGE NAILING;  Surgeon: Kendal Franky SQUIBB, MD;  Location: [facility] OR;  Service: Orthopedics;  Laterality: Left;   FEMUR IM NAIL Right 06/30/2021   Procedure: EXCHANGE NAILING FEMUR NONUNION;  Surgeon: Kendal Franky SQUIBB, MD;  Location: [facility];  Service: Orthopedics;  Laterality: Right;   HARDWARE REMOVAL Left 02/03/2021   Procedure: HARDWARE REMOVAL;  Surgeon: Kendal Franky SQUIBB, MD;  Location: [facility] OR;  Service: Orthopedics;  Laterality: Left;   KNEE SURGERY     SUBMANDIBULAR GLAND EXCISION  1982   Social History   Occupational History   Not on file  Tobacco Use   Smoking status: Never   Smokeless tobacco: Never  Vaping Use   Vaping status: Never Used  Substance and Sexual Activity   Alcohol use: Not Currently   Drug use: Not Currently   Sexual activity: Not  Currently   Current Outpatient Medications  Medication Instructions   acetaminophen  (TYLENOL ) 500 mg, Oral, Every 6 hours PRN   fluticasone  (FLONASE ) 50 MCG/ACT nasal spray 2 sprays, Each Nare, Daily   lidocaine  (LIDODERM ) 5 % 1 patch, Transdermal, Every 24 hours   meloxicam (MOBIC) 15 mg, Oral, Daily   montelukast  (SINGULAIR ) 10 mg, Oral, Daily   Multiple Vitamin (MULTIVITAMIN WITH MINERALS) TABS tablet 1 tablet, Oral, Daily   oxyCODONE -acetaminophen  (PERCOCET) 5-325 MG tablet 1-2 tablets, Oral, Every 4 hours PRN   tiZANidine  (ZANAFLEX ) 4 mg, Oral, Every 6 hours PRN   VITAMIN D PO 2 capsules, Oral, Daily   Allergies as of 03/15/2024 - Review Complete 05/12/2023  Allergen  Reaction Noted   Penicillins Hives 04/01/2020   Sulfa antibiotics  05/12/2023   Sulfate Hives 04/01/2020   "

## 2024-03-22 ENCOUNTER — Ambulatory Visit

## 2024-03-22 ENCOUNTER — Ambulatory Visit: Admitting: Podiatry

## 2024-03-22 VITALS — BP 111/69 | HR 77 | Ht 68.0 in | Wt 272.2 lb

## 2024-03-22 DIAGNOSIS — G8929 Other chronic pain: Secondary | ICD-10-CM | POA: Diagnosis not present

## 2024-03-22 DIAGNOSIS — M25562 Pain in left knee: Secondary | ICD-10-CM

## 2024-03-22 DIAGNOSIS — M76821 Posterior tibial tendinitis, right leg: Secondary | ICD-10-CM

## 2024-03-22 DIAGNOSIS — S8002XA Contusion of left knee, initial encounter: Secondary | ICD-10-CM

## 2024-03-22 DIAGNOSIS — M25561 Pain in right knee: Secondary | ICD-10-CM | POA: Diagnosis not present

## 2024-03-22 DIAGNOSIS — M1711 Unilateral primary osteoarthritis, right knee: Secondary | ICD-10-CM

## 2024-03-22 NOTE — Patient Instructions (Signed)

## 2024-03-22 NOTE — Progress Notes (Signed)
 "  Office Visit Note   Patient: Kristin Lopez           Date of Birth: 1965/05/28           MRN: [id number] Visit Date: 03/22/2024              Requested by: Lora Odor, FNP [address] [place],  [place] [address] PCP: Lora Odor, FNP   Assessment & Plan: Visit Diagnoses:  1. Primary osteoarthritis of right knee   2. Chronic pain of both knees   3. Contusion of left knee, initial encounter     Plan: Natural history and expected course discussed. Questions answered. Recommend rest and ice. PT referral for knee strengthening and ROM. Recommend acetaminophen  as needed for pain. Recommed NSAIDS as needed for pain. Follow up prn  Orders:  Orders Placed This Encounter  Procedures   DG Knee 3 Views Left   DG Knee 3 Views Right     Subjective: Chief Complaint: Bilateral knee pain  HPI Patient is a 59 y.o. year old female who presents with a knee injury involving the left knee. Onset of the symptoms was a week ago. Inciting event: patient fell directly onto the left knee. Current symptoms include pain. Also complains of right knee pain. Right knee pain worse with ambulation. Using a cane to support herself. No other complaints.  Objective: Vital Signs: BP 111/69   Pulse 77   Ht 5' 8 (1.727 m)   Wt 123.5 kg   BMI 41.39 kg/m   Physical Exam Gen: Alert, No Acute Distress right knee: Skin intact, no erythema or induration noted. lateral joint line tenderness to palpation. Range of motion 0 to 120. No instability with varus or valgus stress. left knee: Skin intact, no erythema or induration noted. TTP just distal to joint line anteriorly. Range of motion 0 to 130. No instability with varus or valgus stress.  Imaging: Radiographs personally reviewed by me; reveal mild to moderate arthritis of the right knee; there is hardware in the distal femur and proximal tibia with no signs of complication. 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  Past Surgical History:  Procedure Laterality Date   ABDOMINAL HYSTERECTOMY  2021   FEMUR FRACTURE SURGERY     FEMUR IM NAIL Left 02/03/2021   Procedure: INTRAMEDULLARY (IM) RETROGRADE FEMORAL EXCHANGE NAILING;  Surgeon: Kendal Franky SQUIBB, MD;  Location: [facility];  Service: Orthopedics;  Laterality: Left;   FEMUR IM NAIL Right 06/30/2021   Procedure: EXCHANGE NAILING FEMUR NONUNION;  Surgeon: Kendal Franky SQUIBB, MD;  Location: [facility];  Service: Orthopedics;  Laterality: Right;   HARDWARE REMOVAL Left 02/03/2021   Procedure: HARDWARE REMOVAL;  Surgeon: Kendal Franky SQUIBB, MD;  Location: [facility];  Service: Orthopedics;  Laterality: Left;   KNEE SURGERY     SUBMANDIBULAR GLAND EXCISION  1982   Social History   Occupational  History   Not on file  Tobacco Use   Smoking status: Never   Smokeless tobacco: Never  Vaping Use   Vaping status: Never Used  Substance and Sexual Activity   Alcohol use: Not Currently   Drug use: Not Currently   Sexual activity: Not Currently   Current Outpatient Medications  Medication Instructions   acetaminophen  (TYLENOL ) 500 mg, Oral, Every 6 hours PRN   fluticasone  (FLONASE ) 50 MCG/ACT nasal spray 2 sprays, Each Nare, Daily   lidocaine  (LIDODERM ) 5 % 1 patch, Transdermal, Every 24 hours   meloxicam (MOBIC) 15 mg, Oral, Daily   montelukast  (SINGULAIR ) 10 mg, Oral, Daily   Multiple Vitamin (MULTIVITAMIN WITH MINERALS) TABS tablet 1 tablet, Oral, Daily    oxyCODONE -acetaminophen  (PERCOCET) 5-325 MG tablet 1-2 tablets, Oral, Every 4 hours PRN   tiZANidine  (ZANAFLEX ) 4 mg, Oral, Every 6 hours PRN   VITAMIN D PO 2 capsules, Oral, Daily   Allergies as of 03/22/2024 - Review Complete 05/12/2023  Allergen Reaction Noted   Penicillins Hives 04/01/2020   Sulfa antibiotics  05/12/2023   Sulfate Hives 04/01/2020   "
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
## 2024-04-30 ENCOUNTER — Ambulatory Visit: Admitting: Family Medicine

## 2024-05-24 ENCOUNTER — Encounter: Admitting: Podiatry

## 2024-06-07 ENCOUNTER — Encounter: Admitting: Podiatry

## 2024-06-21 ENCOUNTER — Encounter: Admitting: Podiatry
